# Patient Record
Sex: Male | Born: 1970
Health system: Southern US, Community
[De-identification: ages and names within clinical notes are randomized; demographics above are authoritative.]

## PROBLEM LIST (undated history)

## (undated) DIAGNOSIS — Z87442 Personal history of urinary calculi: Secondary | ICD-10-CM

## (undated) DIAGNOSIS — F411 Generalized anxiety disorder: Secondary | ICD-10-CM

## (undated) DIAGNOSIS — M1711 Unilateral primary osteoarthritis, right knee: Secondary | ICD-10-CM

## (undated) DIAGNOSIS — J309 Allergic rhinitis, unspecified: Secondary | ICD-10-CM

## (undated) DIAGNOSIS — I10 Essential (primary) hypertension: Secondary | ICD-10-CM

## (undated) DIAGNOSIS — M1990 Unspecified osteoarthritis, unspecified site: Secondary | ICD-10-CM

## (undated) DIAGNOSIS — E559 Vitamin D deficiency, unspecified: Secondary | ICD-10-CM

## (undated) DIAGNOSIS — H35713 Central serous chorioretinopathy, bilateral: Secondary | ICD-10-CM

## (undated) DIAGNOSIS — J189 Pneumonia, unspecified organism: Secondary | ICD-10-CM

## (undated) DIAGNOSIS — R519 Headache, unspecified: Secondary | ICD-10-CM

## (undated) DIAGNOSIS — E785 Hyperlipidemia, unspecified: Secondary | ICD-10-CM

## (undated) DIAGNOSIS — G4733 Obstructive sleep apnea (adult) (pediatric): Secondary | ICD-10-CM

## (undated) DIAGNOSIS — R51 Headache: Secondary | ICD-10-CM

## (undated) DIAGNOSIS — G8929 Other chronic pain: Secondary | ICD-10-CM

## (undated) DIAGNOSIS — M545 Low back pain: Secondary | ICD-10-CM

## (undated) DIAGNOSIS — E349 Endocrine disorder, unspecified: Secondary | ICD-10-CM

## (undated) HISTORY — DX: Other chronic pain: G89.29

## (undated) HISTORY — DX: Hyperlipidemia, unspecified: E78.5

## (undated) HISTORY — DX: Personal history of urinary calculi: Z87.442

## (undated) HISTORY — DX: Generalized anxiety disorder: F41.1

## (undated) HISTORY — DX: Low back pain: M54.5

---

## 2004-08-16 ENCOUNTER — Emergency Department: Payer: Self-pay | Admitting: Emergency Medicine

## 2004-10-20 ENCOUNTER — Ambulatory Visit: Payer: Self-pay | Admitting: Internal Medicine

## 2005-05-25 ENCOUNTER — Ambulatory Visit: Payer: Self-pay | Admitting: Internal Medicine

## 2005-08-30 ENCOUNTER — Ambulatory Visit: Payer: Self-pay | Admitting: Internal Medicine

## 2006-07-07 ENCOUNTER — Ambulatory Visit: Payer: Self-pay | Admitting: Internal Medicine

## 2006-10-09 ENCOUNTER — Ambulatory Visit: Payer: Self-pay | Admitting: Internal Medicine

## 2007-01-12 ENCOUNTER — Ambulatory Visit: Payer: Self-pay | Admitting: Internal Medicine

## 2007-01-13 DIAGNOSIS — F411 Generalized anxiety disorder: Secondary | ICD-10-CM

## 2007-01-13 DIAGNOSIS — E785 Hyperlipidemia, unspecified: Secondary | ICD-10-CM | POA: Insufficient documentation

## 2007-01-13 DIAGNOSIS — F418 Other specified anxiety disorders: Secondary | ICD-10-CM | POA: Insufficient documentation

## 2007-01-13 DIAGNOSIS — R51 Headache: Secondary | ICD-10-CM | POA: Insufficient documentation

## 2007-01-13 DIAGNOSIS — R519 Headache, unspecified: Secondary | ICD-10-CM | POA: Insufficient documentation

## 2007-01-13 DIAGNOSIS — J309 Allergic rhinitis, unspecified: Secondary | ICD-10-CM | POA: Insufficient documentation

## 2007-01-13 HISTORY — DX: Hyperlipidemia, unspecified: E78.5

## 2007-01-13 HISTORY — DX: Generalized anxiety disorder: F41.1

## 2007-03-03 ENCOUNTER — Encounter: Payer: Self-pay | Admitting: Internal Medicine

## 2007-04-26 DIAGNOSIS — Z87442 Personal history of urinary calculi: Secondary | ICD-10-CM | POA: Insufficient documentation

## 2007-04-26 HISTORY — DX: Personal history of urinary calculi: Z87.442

## 2007-06-06 ENCOUNTER — Telehealth (INDEPENDENT_AMBULATORY_CARE_PROVIDER_SITE_OTHER): Payer: Self-pay | Admitting: *Deleted

## 2007-06-12 ENCOUNTER — Ambulatory Visit: Payer: Self-pay | Admitting: Internal Medicine

## 2007-06-12 DIAGNOSIS — M549 Dorsalgia, unspecified: Secondary | ICD-10-CM | POA: Insufficient documentation

## 2007-07-05 ENCOUNTER — Encounter: Payer: Self-pay | Admitting: Internal Medicine

## 2007-07-12 ENCOUNTER — Ambulatory Visit: Payer: Self-pay | Admitting: Internal Medicine

## 2007-07-12 DIAGNOSIS — M545 Low back pain, unspecified: Secondary | ICD-10-CM | POA: Insufficient documentation

## 2007-08-09 ENCOUNTER — Encounter: Payer: Self-pay | Admitting: Internal Medicine

## 2007-08-24 ENCOUNTER — Telehealth: Payer: Self-pay | Admitting: Internal Medicine

## 2007-09-25 ENCOUNTER — Telehealth (INDEPENDENT_AMBULATORY_CARE_PROVIDER_SITE_OTHER): Payer: Self-pay | Admitting: *Deleted

## 2007-10-02 ENCOUNTER — Encounter: Payer: Self-pay | Admitting: Internal Medicine

## 2007-10-11 ENCOUNTER — Ambulatory Visit: Payer: Self-pay | Admitting: Internal Medicine

## 2008-01-04 ENCOUNTER — Telehealth: Payer: Self-pay | Admitting: Internal Medicine

## 2008-01-22 ENCOUNTER — Telehealth (INDEPENDENT_AMBULATORY_CARE_PROVIDER_SITE_OTHER): Payer: Self-pay | Admitting: *Deleted

## 2008-02-12 ENCOUNTER — Telehealth (INDEPENDENT_AMBULATORY_CARE_PROVIDER_SITE_OTHER): Payer: Self-pay | Admitting: *Deleted

## 2008-05-20 ENCOUNTER — Telehealth (INDEPENDENT_AMBULATORY_CARE_PROVIDER_SITE_OTHER): Payer: Self-pay | Admitting: *Deleted

## 2008-06-02 ENCOUNTER — Ambulatory Visit: Payer: Self-pay | Admitting: Internal Medicine

## 2008-06-02 DIAGNOSIS — R030 Elevated blood-pressure reading, without diagnosis of hypertension: Secondary | ICD-10-CM | POA: Insufficient documentation

## 2008-07-31 ENCOUNTER — Ambulatory Visit: Payer: Self-pay | Admitting: Internal Medicine

## 2008-07-31 LAB — CONVERTED CEMR LAB
ALT: 62 units/L — ABNORMAL HIGH (ref 0–53)
AST: 20 units/L (ref 0–37)
Alkaline Phosphatase: 69 units/L (ref 39–117)
Basophils Absolute: 0 10*3/uL (ref 0.0–0.1)
Bilirubin, Direct: 0.1 mg/dL (ref 0.0–0.3)
CO2: 28 meq/L (ref 19–32)
Chloride: 104 meq/L (ref 96–112)
Cholesterol: 193 mg/dL (ref 0–200)
Creatinine, Ser: 0.9 mg/dL (ref 0.4–1.5)
Direct LDL: 124.8 mg/dL
Eosinophils Absolute: 0.2 10*3/uL (ref 0.0–0.7)
GFR calc non Af Amer: 101 mL/min
HDL: 28.4 mg/dL — ABNORMAL LOW (ref >39.0)
Hemoglobin, Urine: NEGATIVE
Ketones, ur: NEGATIVE mg/dL
Leukocytes, UA: NEGATIVE
Lymphocytes Relative: 37.4 % (ref 12.0–46.0)
MCHC: 34.9 g/dL (ref 30.0–36.0)
MCV: 89.1 fL (ref 78.0–100.0)
Neutrophils Relative %: 51 % (ref 43.0–77.0)
Platelets: 305 10*3/uL (ref 150–400)
Potassium: 4.2 meq/L (ref 3.5–5.1)
RBC: 4.65 M/uL (ref 4.22–5.81)
Sodium: 139 meq/L (ref 135–145)
Specific Gravity, Urine: 1.025 (ref 1.000–1.03)
Total Bilirubin: 0.9 mg/dL (ref 0.3–1.2)
Total CHOL/HDL Ratio: 6.8
Urine Glucose: NEGATIVE mg/dL
Urobilinogen, UA: 0.2 (ref 0.0–1.0)
VLDL: 52 mg/dL — ABNORMAL HIGH (ref 0–40)

## 2008-08-05 ENCOUNTER — Ambulatory Visit: Payer: Self-pay | Admitting: Internal Medicine

## 2008-09-12 ENCOUNTER — Telehealth (INDEPENDENT_AMBULATORY_CARE_PROVIDER_SITE_OTHER): Payer: Self-pay | Admitting: *Deleted

## 2008-09-17 ENCOUNTER — Telehealth: Payer: Self-pay | Admitting: Internal Medicine

## 2008-09-29 ENCOUNTER — Telehealth (INDEPENDENT_AMBULATORY_CARE_PROVIDER_SITE_OTHER): Payer: Self-pay | Admitting: *Deleted

## 2008-12-30 ENCOUNTER — Telehealth: Payer: Self-pay | Admitting: Internal Medicine

## 2009-01-12 ENCOUNTER — Telehealth (INDEPENDENT_AMBULATORY_CARE_PROVIDER_SITE_OTHER): Payer: Self-pay | Admitting: *Deleted

## 2009-01-27 ENCOUNTER — Telehealth: Payer: Self-pay | Admitting: Internal Medicine

## 2009-02-04 ENCOUNTER — Telehealth: Payer: Self-pay | Admitting: Internal Medicine

## 2009-02-10 ENCOUNTER — Ambulatory Visit: Payer: Self-pay | Admitting: Internal Medicine

## 2009-02-26 ENCOUNTER — Telehealth: Payer: Self-pay | Admitting: Internal Medicine

## 2009-05-22 ENCOUNTER — Telehealth: Payer: Self-pay | Admitting: Internal Medicine

## 2009-06-08 ENCOUNTER — Telehealth: Payer: Self-pay | Admitting: Internal Medicine

## 2009-08-20 ENCOUNTER — Telehealth: Payer: Self-pay | Admitting: Internal Medicine

## 2009-09-24 ENCOUNTER — Telehealth: Payer: Self-pay | Admitting: Internal Medicine

## 2009-11-25 ENCOUNTER — Telehealth: Payer: Self-pay | Admitting: Internal Medicine

## 2009-12-24 ENCOUNTER — Telehealth: Payer: Self-pay | Admitting: Internal Medicine

## 2010-01-25 ENCOUNTER — Telehealth: Payer: Self-pay | Admitting: Internal Medicine

## 2010-02-26 ENCOUNTER — Ambulatory Visit: Payer: Self-pay | Admitting: Internal Medicine

## 2010-02-26 LAB — CONVERTED CEMR LAB
ALT: 44 units/L (ref 0–53)
BUN: 18 mg/dL (ref 6–23)
Basophils Relative: 0.4 % (ref 0.0–3.0)
CO2: 27 meq/L (ref 19–32)
Chloride: 107 meq/L (ref 96–112)
Cholesterol: 263 mg/dL — ABNORMAL HIGH (ref 0–200)
Creatinine, Ser: 0.8 mg/dL (ref 0.4–1.5)
Eosinophils Absolute: 0.3 10*3/uL (ref 0.0–0.7)
Eosinophils Relative: 3.9 % (ref 0.0–5.0)
HCT: 41.7 % (ref 39.0–52.0)
Ketones, ur: NEGATIVE mg/dL
Lymphs Abs: 2.1 10*3/uL (ref 0.7–4.0)
MCHC: 35.5 g/dL (ref 30.0–36.0)
MCV: 89.2 fL (ref 78.0–100.0)
Monocytes Absolute: 0.5 10*3/uL (ref 0.1–1.0)
Platelets: 234 10*3/uL (ref 150.0–400.0)
Potassium: 5 meq/L (ref 3.5–5.1)
Specific Gravity, Urine: 1.02 (ref 1.000–1.030)
TSH: 0.97 microintl units/mL (ref 0.35–5.50)
Total Bilirubin: 0.7 mg/dL (ref 0.3–1.2)
Total Protein: 7.4 g/dL (ref 6.0–8.3)
Triglycerides: 345 mg/dL — ABNORMAL HIGH (ref 0.0–149.0)
Urine Glucose: NEGATIVE mg/dL
WBC: 6.8 10*3/uL (ref 4.5–10.5)
pH: 6 (ref 5.0–8.0)

## 2010-03-04 ENCOUNTER — Telehealth: Payer: Self-pay | Admitting: Internal Medicine

## 2010-05-31 ENCOUNTER — Telehealth: Payer: Self-pay | Admitting: Internal Medicine

## 2010-07-22 NOTE — Progress Notes (Signed)
Summary: med. refill  Phone Note Refill Request Message from:  Fax from Pharmacy on May 31, 2010 11:36 AM  Refills Requested: Medication #1:  VICODIN ES 7.5-750 MG  TABS 1 by mouth qid as needed pain - to fill june 8   Dosage confirmed as above?Dosage Confirmed   Last Refilled: 02/2010   Notes: [facility] [place] [place] [phone] Initial call taken by: Zella Ball Ewing CMA Duncan Dull),  May 31, 2010 11:38 AM    New/Updated Medications: VICODIN ES 7.5-750 MG  TABS (HYDROCODONE-ACETAMINOPHEN) 1 by mouth qid as needed pain - to fill May 31, 2010 Prescriptions: VICODIN ES 7.5-750 MG  TABS (HYDROCODONE-ACETAMINOPHEN) 1 by mouth qid as needed pain - to fill May 31, 2010  #120 x 2   Entered and Authorized by:   Corwin Levins MD   Signed by:   Corwin Levins MD on 05/31/2010   Method used:   Print then Give to Patient   RxID:   [id number]  done hardcopy to LIM side B - dahlia Corwin Levins MD  May 31, 2010 1:00 PM   Rx faxed to pharmacy Margaret Pyle, CMA  May 31, 2010 1:06 PM

## 2010-07-22 NOTE — Progress Notes (Signed)
Summary: medication refill  Phone Note Refill Request Message from:  Fax from Pharmacy on January 25, 2010 9:27 AM  Refills Requested: Medication #1:  XANAX 0.5 MG  TABS 1 by mouth three times a day as needed  - to fill july 7   Dosage confirmed as above?Dosage Confirmed   Last Refilled: 12/24/2009   [facility] [address] [id number] Initial call taken by: Zella Ball Ewing CMA Duncan Dull),  January 25, 2010 9:27 AM  Follow-up for Phone Call        called pt to inform to schedule office visit. He has scheduled one for February 26, 2010 at 8:30. Follow-up by: Zella Ball Ewing CMA Duncan Dull),  January 25, 2010 1:44 PM  Additional Follow-up for Phone Call Additional follow up Details #1::        done hardcopy to LIM side B - dahlia  Additional Follow-up by: Corwin Levins MD,  January 25, 2010 2:44 PM    Additional Follow-up for Phone Call Additional follow up Details #2::    Rx faxed to pharmacy Follow-up by: Margaret Pyle, CMA,  January 25, 2010 2:46 PM  New/Updated Medications: XANAX 0.5 MG  TABS (ALPRAZOLAM) 1 by mouth three times a day as needed  - to fill Jan 25, 2010 Prescriptions: XANAX 0.5 MG  TABS (ALPRAZOLAM) 1 by mouth three times a day as needed  - to fill Jan 25, 2010  #90 x 0   Entered and Authorized by:   Corwin Levins MD   Signed by:   Corwin Levins MD on 01/25/2010   Method used:   Print then Give to Patient   RxID:   [id number]   no/needs OV - pt was notified at last refills request  Corwin Levins MD  January 25, 2010 12:51 PM

## 2010-07-22 NOTE — Assessment & Plan Note (Signed)
Summary: CPX/ WANTS TO DO LABS SAME DAY PRIOR TO APPT/NWS  #   Vital Signs:  Patient profile:   41 year old male Height:      69 inches Weight:      222.25 pounds BMI:     32.94 O2 Sat:      97 % on Room air Temp:     98.5 degrees F oral Pulse rate:   68 / minute BP sitting:   114 / 72  (left arm) Cuff size:   large  Vitals Entered By: Zella Ball Ewing CMA Duncan Dull) (February 26, 2010 8:18 AM)  O2 Flow:  Room air  Preventive Care Screening     declnies flu shot today  CC: Adult Physical/RE   CC:  Adult Physical/RE.  History of Present Illness: overall doing ok;  pk wt over the past yr 11, but down again to 222 today (wwe last yr);  Pt denies CP, worsening sob, doe, wheezing, orthopnea, pnd, worsening LE edema, palps, dizziness or syncope  Pt denies new neuro symptoms such as headache, facial or extremity weakness  No fever, wt loss, night sweats, loss of appetite or other constitutional symptoms  Walks 2 miles per day 5 days per wk at the gym on treadmill. Works for [employer] for [employer] of [employer].    Preventive Screening-Counseling & Management      Drug Use:  no.    Problems Prior to Update: 1)  General Cnsl Initiation Oth Contracept Measures  (ICD-V25.02) 2)  Encounter For Fertility Preservation Counseling  (ICD-V26.42) 3)  Preventive Health Care  (ICD-V70.0) 4)  Elevated Blood Pressure Without Diagnosis of Hypertension  (ICD-796.2) 5)  Low Back Pain  (ICD-724.2) 6)  Back Pain  (ICD-724.5) 7)  Nephrolithiasis, Hx of  (ICD-V13.01) 8)  Hyperlipidemia  (ICD-272.4) 9)  Headache  (ICD-784.0) 10)  Anxiety  (ICD-300.00) 11)  Allergic Rhinitis  (ICD-477.9)  Medications Prior to Update: 1)  Zyrtec Allergy 10 Mg  Tabs (Cetirizine Hcl) .... Take One Tablet Daily 2)  Xanax 0.5 Mg  Tabs (Alprazolam) .Marland Kitchen.. 1 By Mouth Three Times A Day As Needed  - To Fill Jan 25, 2010 3)  Vicodin Es 7.5-750 Mg  Tabs (Hydrocodone-Acetaminophen) .Marland Kitchen.. 1 By Mouth Qid As Needed Pain - To Fill November 25, 2009  -   Note:  Pt Needs Return Office Visit For Further Refills Please 4)  Fluticasone Propionate 50 Mcg/act  Susp (Fluticasone Propionate) .... 2 Sprays Each Nostril 1 Qd 5)  Naproxen 500 Mg Tabs (Naproxen) .Marland Kitchen.. 1 By Mouth Two Times A Day As Needed Pain  Current Medications (verified): 1)  Zyrtec Allergy 10 Mg  Tabs (Cetirizine Hcl) .... Take One Tablet Daily 2)  Xanax 0.5 Mg  Tabs (Alprazolam) .Marland Kitchen.. 1 By Mouth Three Times A Day As Needed  - To Fill Sept 9. 2011 3)  Vicodin Es 7.5-750 Mg  Tabs (Hydrocodone-Acetaminophen) .Marland Kitchen.. 1 By Mouth Qid As Needed Pain - To Fill November 25, 2009 4)  Fluticasone Propionate 50 Mcg/act  Susp (Fluticasone Propionate) .... 2 Sprays Each Nostril Once Daily 5)  Naproxen 500 Mg Tabs (Naproxen) .Marland Kitchen.. 1 By Mouth Two Times A Day As Needed Pain  Allergies (verified): No Known Drug Allergies  Past History:  Family History: Last updated: 02/26/2010 CVA alzheimer's Family History Hypertension DM - mother and uncle father with prostate cancer  Social History: Last updated: 02/26/2010 Former Smoker Alcohol use-yes - rare Married 3 children work - new job sept 2010 [employer] Drug use-no  Risk Factors: Smoking Status: quit (04/26/2007)  Past Medical History: HYPERLIPIDEMIA (ICD-272.4) HEADACHE (ICD-784.0) ANXIETY (ICD-300.00) ALLERGIC RHINITIS (ICD-477.9) Nephrolithiasis, hx of Low back pain  Past Surgical History: Reviewed history from 01/13/2007 and no changes required. Denies surgical history  Family History: Reviewed history from 04/26/2007 and no changes required. CVA alzheimer's Family History Hypertension DM - mother and uncle father with prostate cancer  Social History: Reviewed history from 02/10/2009 and no changes required. Former Smoker Alcohol use-yes - rare Married 3 children work - new job sept 2010 - [employer] Drug use-no Drug Use:  no  Review of  Systems  The patient denies anorexia, fever, weight loss, weight gain, vision loss, decreased hearing, hoarseness, chest pain, syncope, dyspnea on exertion, peripheral edema, prolonged cough, headaches, hemoptysis, abdominal pain, melena, hematochezia, severe indigestion/heartburn, hematuria, muscle weakness, suspicious skin lesions, transient blindness, difficulty walking, depression, unusual weight change, abnormal bleeding, enlarged lymph nodes, and angioedema.         all otherwise negative per pt -    Physical Exam  General:  alert and overweight-appearing.   Head:  normocephalic and atraumatic.   Eyes:  vision grossly intact, pupils equal, and pupils round.   Ears:  R ear normal and L ear normal.   Nose:  no external deformity and no nasal discharge.   Mouth:  no gingival abnormalities and pharynx pink and moist.   Neck:  supple and no masses.   Lungs:  normal respiratory effort and normal breath sounds.   Heart:  normal rate and regular rhythm.   Abdomen:  soft, non-tender, and normal bowel sounds.   Msk:  no joint tenderness and no joint swelling.   Extremities:  no edema, no erythema  Neurologic:  cranial nerves II-XII intact and strength normal in all extremities.   Skin:  color normal and no rashes.   Psych:  not anxious appearing and not depressed appearing.     Impression & Recommendations:  Problem # 1:  Preventive Health Care (ICD-V70.0)  Overall doing well, age appropriate education and counseling updated and referral for appropriate preventive services done unless declined, immunizations up to date or declined, diet counseling done if overweight, urged to quit smoking if smokes , most recent labs reviewed and current ordered if appropriate, ecg reviewed or declined (interpretation per ECG scanned in the EMR if done); information regarding Medicare Prevention requirements given if appropriate; speciality referrals updated as appropriate   Orders: TLB-BMP (Basic  Metabolic Panel-BMET) (80048-METABOL) TLB-CBC Platelet - w/Differential (85025-CBCD) TLB-Hepatic/Liver Function Pnl (80076-HEPATIC) TLB-TSH (Thyroid Stimulating Hormone) (84443-TSH) TLB-Lipid Panel (80061-LIPID) TLB-Udip ONLY (81003-UDIP)  Problem # 2:     Complete Medication List: 1)  Zyrtec Allergy 10 Mg Tabs (Cetirizine hcl) .... Take one tablet daily 2)  Xanax 0.5 Mg Tabs (Alprazolam) .Marland Kitchen.. 1 by mouth three times a day as needed  - to fill sept 9. 2011 3)  Vicodin Es 7.5-750 Mg Tabs (Hydrocodone-acetaminophen) .Marland Kitchen.. 1 by mouth qid as needed pain - to fill November 25, 2009 4)  Fluticasone Propionate 50 Mcg/act Susp (Fluticasone propionate) .... 2 sprays each nostril once daily 5)  Naproxen 500 Mg Tabs (Naproxen) .Marland Kitchen.. 1 by mouth two times a day as needed pain  Patient Instructions: 1)  Continue all previous medications as before this visit  2)  Please go to the Lab in the basement for your blood and/or urine tests today 3)  Please call the number on the  Blue Card for results of your testing 4)  Please schedule a follow-up appointment in 1 year or sooner if needed Prescriptions: XANAX 0.5 MG  TABS (ALPRAZOLAM) 1 by mouth three times a day as needed  - to fill sept 9. 2011  #90 x 2   Entered and Authorized by:   Corwin Levins MD   Signed by:   Corwin Levins MD on 02/26/2010   Method used:   Print then Give to Patient   RxID:   [id number] VICODIN ES 7.5-750 MG  TABS (HYDROCODONE-ACETAMINOPHEN) 1 by mouth qid as needed pain - to fill November 25, 2009  #120 x 2   Entered and Authorized by:   Corwin Levins MD   Signed by:   Corwin Levins MD on 02/26/2010   Method used:   Print then Give to Patient   RxID:   [id number] FLUTICASONE PROPIONATE 50 MCG/ACT  SUSP (FLUTICASONE PROPIONATE) 2 sprays each nostril once daily  #3 x 3   Entered and Authorized by:   Corwin Levins MD   Signed by:   Corwin Levins MD on 02/26/2010   Method used:   Print then Give to Patient   RxID:   [id number]

## 2010-07-22 NOTE — Progress Notes (Signed)
Summary: hepatitis panel  Phone Note Call from Patient   Caller: Patient Reason for Call: Talk to Nurse Summary of Call: Left msg on vm wanting to get hepatitis level check. Called pt back to get more info did not pick up left msg on vm to RTC Initial call taken by: Orlan Leavens RMA,  March 04, 2010 4:44 PM  Follow-up for Phone Call        called pt again he states he had Hepatitis series done at his work. Last shot was done back in december. Pt wanting to see if he can have bloodwork to see if shot actually took. Pls advise Follow-up by: Orlan Leavens RMA,  March 05, 2010 12:10 PM  Additional Follow-up for Phone Call Additional follow up Details #1::        this is normally not necesarry if he had the series of shots, even if not done exactly as recommended on the shot timing;  if he really wants, we can do the Hep B s Ab (hep B surface Antibody) to see if the shot was effective - v58.69 Additional Follow-up by: Corwin Levins MD,  March 05, 2010 1:01 PM    Additional Follow-up for Phone Call Additional follow up Details #2::    Called pt no ansew LMOM RTC Follow-up by: Orlan Leavens RMA,  March 05, 2010 2:36 PM  Additional Follow-up for Phone Call Additional follow up Details #3:: Details for Additional Follow-up Action Taken: Pt return call back gave him md response. Pt states if he decides to have check he will get it threw his job. Additional Follow-up by: Orlan Leavens RMA,  March 05, 2010 3:54 PM

## 2010-07-22 NOTE — Progress Notes (Signed)
Summary: Med refill  Phone Note Refill Request  on August 20, 2009 9:24 AM  Refills Requested: Medication #1:  VICODIN ES 7.5-750 MG  TABS 1 by mouth qid as needed pain - to fill dec 3   Dosage confirmed as above?Dosage Confirmed   [facility] [address] [place] [phone] Initial call taken by: Scharlene Gloss,  August 20, 2009 9:25 AM  Follow-up for Phone Call        rx faxed to pharmacy Follow-up by: Margaret Pyle, CMA,  August 20, 2009 1:17 PM    New/Updated Medications: VICODIN ES 7.5-750 MG  TABS (HYDROCODONE-ACETAMINOPHEN) 1 by mouth qid as needed pain - to fill Aug 20, 2009 Prescriptions: VICODIN ES 7.5-750 MG  TABS (HYDROCODONE-ACETAMINOPHEN) 1 by mouth qid as needed pain - to fill Aug 20, 2009  #120 x 2   Entered and Authorized by:   Corwin Levins MD   Signed by:   Corwin Levins MD on 08/20/2009   Method used:   Print then Give to Patient   RxID:   [id number]  done hardcopy to LIM side B - dahlia  Corwin Levins MD  August 20, 2009 1:08 PM

## 2010-07-22 NOTE — Progress Notes (Signed)
Summary: Medication Refill  Phone Note Refill Request  on November 25, 2009 8:46 AM  Refills Requested: Medication #1:  VICODIN ES 7.5-750 MG  TABS 1 by mouth qid as needed pain - to fill mar 3   Dosage confirmed as above?Dosage Confirmed   [facility] [address] [place] [phone] Initial call taken by: Scharlene Gloss,  November 25, 2009 8:47 AM    New/Updated Medications: VICODIN ES 7.5-750 MG  TABS (HYDROCODONE-ACETAMINOPHEN) 1 by mouth qid as needed pain - to fill November 25, 2009  -   NOTE:  pt needs return office visit for further refills please Prescriptions: VICODIN ES 7.5-750 MG  TABS (HYDROCODONE-ACETAMINOPHEN) 1 by mouth qid as needed pain - to fill November 25, 2009  -   NOTE:  pt needs return office visit for further refills please  #120 x 2   Entered and Authorized by:   Corwin Levins MD   Signed by:   Corwin Levins MD on 11/25/2009   Method used:   Print then Give to Patient   RxID:   [id number]  .don e Corwin Levins MD  November 25, 2009 10:07 AM   Rx faxed to pharmacy Margaret Pyle, CMA  November 25, 2009 10:09 AM

## 2010-07-22 NOTE — Progress Notes (Signed)
Summary: Med Refill  Phone Note Refill Request  on September 24, 2009 11:11 AM  Refills Requested: Medication #1:  XANAX 0.5 MG  TABS 1 by mouth three times a day as needed  - to fill dec 20   Dosage confirmed as above?Dosage Confirmed   [facility] [place] [phone] Initial call taken by: Scharlene Gloss,  September 24, 2009 11:15 AM  Follow-up for Phone Call        Rx faxed to pharmacy Follow-up by: Margaret Pyle, CMA,  September 24, 2009 1:17 PM    New/Updated Medications: XANAX 0.5 MG  TABS (ALPRAZOLAM) 1 by mouth three times a day as needed  - to fill Sep 24, 2009 Prescriptions: XANAX 0.5 MG  TABS (ALPRAZOLAM) 1 by mouth three times a day as needed  - to fill Sep 24, 2009  #90 x 2   Entered and Authorized by:   Corwin Levins MD   Signed by:   Corwin Levins MD on 09/24/2009   Method used:   Print then Give to Patient   RxID:   [id number]  done hardcopy to LIM side B - dahlia  Corwin Levins MD  September 24, 2009 1:12 PM

## 2010-07-22 NOTE — Progress Notes (Signed)
Summary: med refill  Phone Note Refill Request Message from:  Fax from Pharmacy on May 31, 2010 1:41 PM  Refills Requested: Medication #1:  XANAX 0.5 MG  TABS 1 by mouth three times a day as needed  - to fill sept 9. 2011   Dosage confirmed as above?Dosage Confirmed   Last Refilled: 02/26/2010   Notes: [facility] [address] [place], fax#[phone] Initial call taken by: Zella Ball Ewing CMA Duncan Dull),  May 31, 2010 1:41 PM    New/Updated Medications: XANAX 0.5 MG  TABS (ALPRAZOLAM) 1 by mouth three times a day as needed  - to fill dec 9 , 2011 Prescriptions: XANAX 0.5 MG  TABS (ALPRAZOLAM) 1 by mouth three times a day as needed  - to fill dec 9 , 2011  #90 x 2   Entered and Authorized by:   Corwin Levins MD   Signed by:   Corwin Levins MD on 05/31/2010   Method used:   Print then Give to Patient   RxID:   [id number]  done hardcopy to LIM side B - dahlia Corwin Levins MD  May 31, 2010 5:48 PM   Rx faxed to pharmacy Margaret Pyle, CMA  June 01, 2010 8:04 AM

## 2010-07-22 NOTE — Progress Notes (Signed)
Summary: Medication Refill  Phone Note Refill Request Message from:  Fax from Pharmacy on December 24, 2009 9:05 AM  Refills Requested: Medication #1:  XANAX 0.5 MG  TABS 1 by mouth three times a day as needed  - to fill apr 7   Dosage confirmed as above?Dosage Confirmed   Last Refilled: 09/24/2009   Notes[facility] [place] [phone] Initial call taken by: Zella Ball Ewing CMA Duncan Dull),  December 24, 2009 9:06 AM    New/Updated Medications: XANAX 0.5 MG  TABS (ALPRAZOLAM) 1 by mouth three times a day as needed  - to fill December 24, 2009  - please make [place] office visit for further refills Prescriptions: XANAX 0.5 MG  TABS (ALPRAZOLAM) 1 by mouth three times a day as needed  - to fill December 24, 2009  - please make [place] office visit for further refills  #90 x 0   Entered and Authorized by:   Corwin Levins MD   Signed by:   Corwin Levins MD on 12/24/2009   Method used:   Print then Give to Patient   RxID:   [id number]  done hardcopy to LIM side B - dahlia  Corwin Levins MD  December 24, 2009 9:07 AM   Rx faxed to pharmacy Margaret Pyle, CMA  December 24, 2009 9:09 AM

## 2010-08-31 ENCOUNTER — Telehealth: Payer: Self-pay | Admitting: Internal Medicine

## 2010-09-07 NOTE — Progress Notes (Signed)
Summary: med refill  Phone Note Refill Request Message from:  Fax from Pharmacy on August 31, 2010 2:51 PM  Refills Requested: Medication #1:  XANAX 0.5 MG  TABS 1 by mouth three times a day as needed  - to fill dec 9   Dosage confirmed as above?Dosage Confirmed   Last Refilled: 05/31/2010   Notes[facility] [address] [phone]  Medication #2:  VICODIN ES 7.5-750 MG  TABS 1 by mouth qid as needed pain - to fill dec 12   Dosage confirmed as above?Dosage Confirmed   Last Refilled: 05/31/2010   Notes: [facility] [address] [phone] Initial call taken by: Zella Ball Ewing CMA Duncan Dull),  August 31, 2010 2:53 PM  Follow-up for Phone Call        done hardcopy to LIM side B - dahlia  Follow-up by: Corwin Levins MD,  August 31, 2010 4:43 PM  Additional Follow-up for Phone Call Additional follow up Details #1::        Rxs faxed to pharmacy Additional Follow-up by: Margaret Pyle, CMA,  August 31, 2010 4:48 PM    New/Updated Medications: XANAX 0.5 MG  TABS (ALPRAZOLAM) 1 by mouth three times a day as needed  - to fill Aug 31, 2010 VICODIN ES 7.5-750 MG  TABS (HYDROCODONE-ACETAMINOPHEN) 1 by mouth qid as needed pain - to fill Aug 31, 2010 Prescriptions: XANAX 0.5 MG  TABS (ALPRAZOLAM) 1 by mouth three times a day as needed  - to fill Aug 31, 2010  #90 x 2   Entered and Authorized by:   Corwin Levins MD   Signed by:   Corwin Levins MD on 08/31/2010   Method used:   Print then Give to Patient   RxID:   [id number] VICODIN ES 7.5-750 MG  TABS (HYDROCODONE-ACETAMINOPHEN) 1 by mouth qid as needed pain - to fill Aug 31, 2010  #120 x 2   Entered and Authorized by:   Corwin Levins MD   Signed by:   Corwin Levins MD on 08/31/2010   Method used:   Print then Give to Patient   RxID:   [id number]

## 2010-12-01 ENCOUNTER — Other Ambulatory Visit: Payer: Self-pay

## 2010-12-01 MED ORDER — HYDROCODONE-ACETAMINOPHEN 7.5-750 MG PO TABS: 1.0000 | ORAL_TABLET | Freq: Four times a day (QID) | ORAL | Status: DC | PRN

## 2010-12-01 NOTE — Telephone Encounter (Signed)
Faxed hardcopy to pharmacy. 

## 2010-12-02 ENCOUNTER — Other Ambulatory Visit: Payer: Self-pay

## 2010-12-02 MED ORDER — ALPRAZOLAM 0.5 MG PO TABS: 0.5000 mg | ORAL_TABLET | Freq: Three times a day (TID) | ORAL | Status: DC | PRN

## 2010-12-02 NOTE — Telephone Encounter (Signed)
Sent hardcopy to pharmacy 

## 2011-02-10 ENCOUNTER — Other Ambulatory Visit: Payer: Self-pay

## 2011-02-10 MED ORDER — HYDROCODONE-ACETAMINOPHEN 7.5-750 MG PO TABS: 1.0000 | ORAL_TABLET | Freq: Four times a day (QID) | ORAL | Status: DC | PRN

## 2011-02-10 MED ORDER — ALPRAZOLAM 0.5 MG PO TABS: 0.5000 mg | ORAL_TABLET | Freq: Three times a day (TID) | ORAL | Status: DC | PRN

## 2011-02-10 NOTE — Telephone Encounter (Signed)
Faxed hardcopy to pharmacy. 

## 2011-03-17 ENCOUNTER — Other Ambulatory Visit: Payer: Self-pay

## 2011-03-17 MED ORDER — HYDROCODONE-ACETAMINOPHEN 7.5-750 MG PO TABS: 1.0000 | ORAL_TABLET | Freq: Four times a day (QID) | ORAL | Status: DC | PRN

## 2011-03-17 MED ORDER — ALPRAZOLAM 0.5 MG PO TABS: 0.5000 mg | ORAL_TABLET | Freq: Three times a day (TID) | ORAL | Status: DC | PRN

## 2011-03-17 NOTE — Telephone Encounter (Signed)
Done hardcopy to robin  

## 2011-03-18 NOTE — Telephone Encounter (Signed)
[facility]ed [facility] [address] [address] [place] [phone]

## 2011-04-14 ENCOUNTER — Other Ambulatory Visit: Payer: Self-pay

## 2011-04-14 NOTE — Telephone Encounter (Signed)
Tried to call the patient to inform of MD's instructions, but the number in the chart has been disconnected. Informed pharmacy.

## 2011-04-14 NOTE — Telephone Encounter (Signed)
No refills done today  Pt due for OV as documented with last refills

## 2011-04-18 ENCOUNTER — Encounter: Payer: Self-pay | Admitting: Internal Medicine

## 2011-04-18 ENCOUNTER — Telehealth: Payer: Self-pay | Admitting: *Deleted

## 2011-04-18 DIAGNOSIS — Z0000 Encounter for general adult medical examination without abnormal findings: Secondary | ICD-10-CM | POA: Insufficient documentation

## 2011-04-18 NOTE — Telephone Encounter (Signed)
Called the patient back informed of MD's instruction from 04/14/2011 patient agreed to schedule office visit. New phone number is [phone]

## 2011-04-18 NOTE — Telephone Encounter (Signed)
Patient requesting RFs. He did not leave meds that he needed. Please call patient to clarify.

## 2011-04-19 ENCOUNTER — Encounter: Payer: Self-pay | Admitting: Internal Medicine

## 2011-04-19 ENCOUNTER — Other Ambulatory Visit (INDEPENDENT_AMBULATORY_CARE_PROVIDER_SITE_OTHER): Payer: BC Managed Care – PPO

## 2011-04-19 ENCOUNTER — Other Ambulatory Visit: Payer: Self-pay | Admitting: Internal Medicine

## 2011-04-19 ENCOUNTER — Ambulatory Visit (INDEPENDENT_AMBULATORY_CARE_PROVIDER_SITE_OTHER): Payer: BC Managed Care – PPO | Admitting: Internal Medicine

## 2011-04-19 VITALS — BP 110/60 | HR 66 | Temp 97.3°F | Ht 69.0 in | Wt 225.2 lb

## 2011-04-19 DIAGNOSIS — F411 Generalized anxiety disorder: Secondary | ICD-10-CM

## 2011-04-19 DIAGNOSIS — M545 Low back pain, unspecified: Secondary | ICD-10-CM | POA: Insufficient documentation

## 2011-04-19 DIAGNOSIS — G8929 Other chronic pain: Secondary | ICD-10-CM

## 2011-04-19 DIAGNOSIS — J309 Allergic rhinitis, unspecified: Secondary | ICD-10-CM

## 2011-04-19 DIAGNOSIS — Z0000 Encounter for general adult medical examination without abnormal findings: Secondary | ICD-10-CM

## 2011-04-19 HISTORY — DX: Other chronic pain: G89.29

## 2011-04-19 HISTORY — DX: Low back pain, unspecified: M54.50

## 2011-04-19 LAB — URINALYSIS, ROUTINE W REFLEX MICROSCOPIC
Bilirubin Urine: NEGATIVE
Ketones, ur: NEGATIVE
Leukocytes, UA: NEGATIVE
pH: 6 (ref 5.0–8.0)

## 2011-04-19 LAB — HEPATIC FUNCTION PANEL
AST: 16 U/L (ref 0–37)
Alkaline Phosphatase: 66 U/L (ref 39–117)
Bilirubin, Direct: 0.1 mg/dL (ref 0.0–0.3)
Total Bilirubin: 0.4 mg/dL (ref 0.3–1.2)

## 2011-04-19 LAB — LIPID PANEL
HDL: 39.4 mg/dL (ref >39.00)
VLDL: 50.2 mg/dL — ABNORMAL HIGH (ref 0.0–40.0)

## 2011-04-19 LAB — CBC WITH DIFFERENTIAL/PLATELET
Basophils Absolute: 0 10*3/uL (ref 0.0–0.1)
Eosinophils Relative: 3.5 % (ref 0.0–5.0)
HCT: 42.7 % (ref 39.0–52.0)
Lymphs Abs: 1.9 10*3/uL (ref 0.7–4.0)
MCHC: 34.9 g/dL (ref 30.0–36.0)
MCV: 89 fl (ref 78.0–100.0)
Monocytes Absolute: 0.5 10*3/uL (ref 0.1–1.0)
Platelets: 246 10*3/uL (ref 150.0–400.0)
RDW: 14.2 % (ref 11.5–14.6)

## 2011-04-19 LAB — TSH: TSH: 1.85 u[IU]/mL (ref 0.35–5.50)

## 2011-04-19 LAB — BASIC METABOLIC PANEL
GFR: 115.84 mL/min (ref >60.00)
Potassium: 3.9 mEq/L (ref 3.5–5.1)
Sodium: 142 mEq/L (ref 135–145)

## 2011-04-19 MED ORDER — HYDROCODONE-ACETAMINOPHEN 7.5-325 MG PO TABS: 1.0000 | ORAL_TABLET | Freq: Four times a day (QID) | ORAL | Status: AC | PRN

## 2011-04-19 MED ORDER — ESCITALOPRAM OXALATE 10 MG PO TABS: 10.0000 mg | ORAL_TABLET | Freq: Every day | ORAL | Status: AC

## 2011-04-19 MED ORDER — ALPRAZOLAM 0.5 MG PO TABS: 0.5000 mg | ORAL_TABLET | Freq: Three times a day (TID) | ORAL | Status: DC | PRN

## 2011-04-19 MED ORDER — CETIRIZINE HCL 10 MG PO TABS: 10.0000 mg | ORAL_TABLET | Freq: Every day | ORAL | Status: DC

## 2011-04-19 MED ORDER — FLUTICASONE PROPIONATE 50 MCG/ACT NA SUSP: 2.0000 | Freq: Every day | NASAL | Status: DC

## 2011-04-19 NOTE — Patient Instructions (Signed)
Start the lexapro 10 mg per day The vicodin was adjusted to a lower tylenol dose Continue all other medications as before, but try to take less of the xanax if you can You will be contacted regarding the referral for: MRI for the lower back Please go to LAB in the Basement for the blood and/or urine tests to be done today Please call the phone number [phone] (the [employer]Tree System) for results of testing in 2-3 days;  When calling, simply dial the number, and when prompted enter the MRN number above (the Medical Record Number) and the # key, then the message should start. You are given the work note Please return in 3 months

## 2011-04-19 NOTE — Assessment & Plan Note (Signed)

## 2011-04-19 NOTE — Assessment & Plan Note (Signed)
stable overall by hx and exam, , and pt to continue medical treatment as before  For flonase refill

## 2011-04-19 NOTE — Progress Notes (Signed)
Subjective:    Patient ID: Gabriel Moon, male    DOB: [birth year], 40 y.o.   MRN: [id number]  HPI  Here for wellness and f/u;  Overall doing ok;  Pt denies CP, worsening SOB, DOE, wheezing, orthopnea, PND, worsening LE edema, palpitations, dizziness or syncope.  Pt denies neurological change such as new Headache, facial or extremity weakness.  Pt denies polydipsia, polyuria, or low sugar symptoms. Pt states overall good compliance with treatment and medications, good tolerability, and trying to follow lower cholesterol diet.  Pt denies worsening depressive symptoms, suicidal ideation or panic. No fever, wt loss, night sweats, loss of appetite, or other constitutional symptoms.  Pt states good ability with ADL's, low fall risk, home safety reviewed and adequate, no significant changes in hearing or vision, and occasionally active with exercise.  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  Fairly physical work, lifts up to about 50 lbs alone up to once per wk, most often 10 to 20 lbs rest of the wk, other times can bend, work overhead, up ladders, but no squatting or crawlling (except rare).  Does have sense of ongoing fatigue, but denies signficant hypersomnolence.  Denies worsening depressive symptoms, suicidal ideation, or panic, though has ongoing anxiety, still fairly anxiety due to social stressors. Past Medical History  Diagnosis Date  . Chronic LBP 04/19/2011  . HYPERLIPIDEMIA 01/13/2007  . ANXIETY 01/13/2007  . ALLERGIC RHINITIS 01/13/2007  . NEPHROLITHIASIS, HX OF 04/26/2007   No past surgical history on file.  reports that he has quit smoking. He does not have any smokeless tobacco history on file. He  reports that he drinks alcohol. He reports that he does not use illicit drugs. family history is not on file. No Known Allergies No current outpatient prescriptions on file prior to visit.   Review of Systems Review of Systems  Constitutional: Negative for diaphoresis, activity change, appetite change and unexpected weight change.  HENT: Negative for hearing loss, ear pain, facial swelling, mouth sores and neck stiffness.   Eyes: Negative for pain, redness and visual disturbance.  Respiratory: Negative for shortness of breath and wheezing.   Cardiovascular: Negative for chest pain and palpitations.  Gastrointestinal: Negative for diarrhea, blood in stool, abdominal distention and rectal pain.  Genitourinary: Negative for hematuria, flank pain and decreased urine volume.  Musculoskeletal: Negative for myalgias and joint swelling.  Skin: Negative for color change and wound.  Neurological: Negative for syncope and numbness.  Hematological: Negative for adenopathy.  Psychiatric/Behavioral: Negative for hallucinations, self-injury, decreased concentration and agitation.      Objective:   Physical Exam BP 110/60  Pulse 66  Temp(Src) 97.3 F (36.3 C) (Oral)  Ht 5\' 9"  (1.753 m)  Wt 225 lb 4 oz (102.173 kg)  BMI 33.26 kg/m2  SpO2 98% Physical Exam  VS noted Constitutional: Pt is oriented to person, place, and time. Appears well-developed and well-nourished.  HENT:  Head: Normocephalic and atraumatic.  Right Ear: External ear normal.  Left Ear: External ear normal.  Nose: Nose normal.  Mouth/Throat: Oropharynx is clear and moist.  Eyes: Conjunctivae and EOM are normal. Pupils are equal, round, and reactive to light.  Neck: Normal range of motion. Neck supple. No JVD present. No tracheal deviation  present.  Cardiovascular: Normal rate, regular rhythm, normal heart sounds and intact distal pulses.   Pulmonary/Chest: Effort normal and breath sounds normal.  Abdominal: Soft. Bowel sounds  are normal. There is no tenderness.  Musculoskeletal: Normal range of motion. Exhibits no edema.  Lymphadenopathy:  Has no cervical adenopathy.  Neurological: Pt is alert and oriented to person, place, and time. Pt has normal reflexes. No cranial nerve deficit. Motor/sens/dtr intact Skin: Skin is warm and dry. No rash noted.  Psychiatric:  Has  normal mood and affect. Behavior is normal.  Spine: nontender, but has ongoing mod bilat diffuse lumbar paravertebral tender       Assessment & Plan:

## 2011-04-19 NOTE — Assessment & Plan Note (Signed)
Chronic ongoing for several yrs, well maintained, but not well defined/characterized;  For MRI LS spine, try to wean vicodin to 7.5/325 qid, f/u 3 mo, consider ortho eval

## 2011-04-19 NOTE — Assessment & Plan Note (Signed)
To add lexapro 10 qd, may be able to wean xanax eventually, .f/u next visit

## 2011-04-26 ENCOUNTER — Other Ambulatory Visit: Payer: BC Managed Care – PPO

## 2011-07-18 ENCOUNTER — Other Ambulatory Visit: Payer: Self-pay

## 2011-07-18 DIAGNOSIS — F411 Generalized anxiety disorder: Secondary | ICD-10-CM

## 2011-07-18 MED ORDER — ALPRAZOLAM 0.5 MG PO TABS: 0.5000 mg | ORAL_TABLET | Freq: Three times a day (TID) | ORAL | Status: DC | PRN

## 2011-07-18 MED ORDER — HYDROCODONE-ACETAMINOPHEN 7.5-325 MG PO TABS: 1.0000 | ORAL_TABLET | Freq: Four times a day (QID) | ORAL | Status: DC | PRN

## 2011-07-18 NOTE — Telephone Encounter (Signed)
Done hardcopy to robin  

## 2011-07-18 NOTE — Telephone Encounter (Signed)
Faxed hardcopy to pharmacy. 

## 2011-07-18 NOTE — Telephone Encounter (Signed)
Patient informed prescription requested is ready for pickup. 

## 2011-07-22 ENCOUNTER — Ambulatory Visit: Payer: BC Managed Care – PPO | Admitting: Internal Medicine

## 2011-10-10 ENCOUNTER — Other Ambulatory Visit: Payer: Self-pay

## 2011-10-10 DIAGNOSIS — F411 Generalized anxiety disorder: Secondary | ICD-10-CM

## 2011-10-10 MED ORDER — ALPRAZOLAM 0.5 MG PO TABS: 0.5000 mg | ORAL_TABLET | Freq: Three times a day (TID) | ORAL | Status: DC | PRN

## 2011-10-10 MED ORDER — HYDROCODONE-ACETAMINOPHEN 7.5-325 MG PO TABS: 1.0000 | ORAL_TABLET | Freq: Four times a day (QID) | ORAL | Status: DC | PRN

## 2011-10-10 NOTE — Telephone Encounter (Signed)
Done hardcopy to robin  

## 2011-10-10 NOTE — Telephone Encounter (Signed)
Faxed hardcopy to pharmacy. 

## 2012-01-02 ENCOUNTER — Other Ambulatory Visit: Payer: Self-pay

## 2012-01-02 MED ORDER — HYDROCODONE-ACETAMINOPHEN 7.5-325 MG PO TABS: 1.0000 | ORAL_TABLET | Freq: Four times a day (QID) | ORAL | Status: DC | PRN

## 2012-01-02 NOTE — Telephone Encounter (Signed)
Faxed hardcopy to pharmacy. 

## 2012-01-02 NOTE — Telephone Encounter (Signed)
Done hardcopy to robin  

## 2012-01-20 ENCOUNTER — Other Ambulatory Visit: Payer: Self-pay

## 2012-01-20 DIAGNOSIS — F411 Generalized anxiety disorder: Secondary | ICD-10-CM

## 2012-01-20 MED ORDER — ALPRAZOLAM 0.5 MG PO TABS: 0.5000 mg | ORAL_TABLET | Freq: Three times a day (TID) | ORAL | Status: DC | PRN

## 2012-01-20 NOTE — Telephone Encounter (Signed)
Faxed hardcopy to pharmacy. 

## 2012-04-02 ENCOUNTER — Other Ambulatory Visit: Payer: Self-pay | Admitting: Internal Medicine

## 2012-04-02 NOTE — Telephone Encounter (Signed)
Done hardcopy to robin  

## 2012-04-03 NOTE — Telephone Encounter (Signed)
Faxed hardcopy to pharmacy. 

## 2012-04-27 ENCOUNTER — Other Ambulatory Visit: Payer: Self-pay | Admitting: Endocrinology

## 2012-04-27 ENCOUNTER — Other Ambulatory Visit: Payer: Self-pay | Admitting: Internal Medicine

## 2012-04-27 MED ORDER — HYDROCODONE-ACETAMINOPHEN 7.5-325 MG PO TABS: 1.0000 | ORAL_TABLET | Freq: Four times a day (QID) | ORAL | Status: DC | PRN

## 2012-04-27 NOTE — Telephone Encounter (Signed)
Called the patient to inform to schedule appt.  He has scheduled for 05/02/12.  Please advise on refill.

## 2012-04-27 NOTE — Telephone Encounter (Signed)
Due OV

## 2012-04-27 NOTE — Telephone Encounter (Signed)
Faxed hardcopy to pharmacy. 

## 2012-04-27 NOTE — Telephone Encounter (Signed)
Done hardcopy to robin  

## 2012-04-28 ENCOUNTER — Other Ambulatory Visit: Payer: Self-pay | Admitting: Internal Medicine

## 2012-04-30 NOTE — Telephone Encounter (Signed)
Last xanax rx done nov 8 - too soon for refills  Pt due for ROV as well- robin to let pt know

## 2012-05-02 ENCOUNTER — Ambulatory Visit (INDEPENDENT_AMBULATORY_CARE_PROVIDER_SITE_OTHER): Payer: BC Managed Care – PPO | Admitting: Internal Medicine

## 2012-05-02 ENCOUNTER — Encounter: Payer: Self-pay | Admitting: Internal Medicine

## 2012-05-02 VITALS — BP 122/80 | HR 76 | Temp 97.9°F | Ht 69.0 in | Wt 219.4 lb

## 2012-05-02 DIAGNOSIS — Z0000 Encounter for general adult medical examination without abnormal findings: Secondary | ICD-10-CM

## 2012-05-02 DIAGNOSIS — M545 Low back pain, unspecified: Secondary | ICD-10-CM

## 2012-05-02 DIAGNOSIS — E785 Hyperlipidemia, unspecified: Secondary | ICD-10-CM

## 2012-05-02 DIAGNOSIS — F411 Generalized anxiety disorder: Secondary | ICD-10-CM

## 2012-05-02 DIAGNOSIS — G8929 Other chronic pain: Secondary | ICD-10-CM

## 2012-05-02 MED ORDER — HYDROCODONE-ACETAMINOPHEN 7.5-325 MG PO TABS: 1.0000 | ORAL_TABLET | Freq: Four times a day (QID) | ORAL | Status: DC | PRN

## 2012-05-02 MED ORDER — ALPRAZOLAM 0.5 MG PO TABS: 0.5000 mg | ORAL_TABLET | Freq: Three times a day (TID) | ORAL | Status: DC | PRN

## 2012-05-02 NOTE — Patient Instructions (Addendum)
You had the flu shot today You are given the refills today Continue all other medications as before Please have the pharmacy call with any other refills you may need. Please continue your efforts at being more active, low cholesterol diet, and weight control. Please return in 6 mo with Lab testing done 3-5 days before

## 2012-05-02 NOTE — Progress Notes (Signed)
  Subjective:    Patient ID: Gabriel Moon, male    DOB: Oct 11, 1970, 41 y.o.   MRN: [id number]  HPI  Here to f/u; overall doing ok,  Pt denies chest pain, increased sob or doe, wheezing, orthopnea, PND, increased LE swelling, palpitations, dizziness or syncope.  Pt denies new neurological symptoms such as new headache, or facial or extremity weakness or numbness   Pt denies polydipsia, polyuria, Pt states overall good compliance with meds, trying to follow lower cholesterol diet, wt overall stable but little exercise however.  Denies worsening depressive symptoms, suicidal ideation, or panic, though has ongoing anxiety.  Could not tolerate the lexapro.   Still with chronic LBP, Dr Magdalene River [facility], with nonfusion part of nornmal spine with subsequent trauma leading to rucurrent spasms. Sent for PT, warned could be up to 2 yrs to get improved results.  No surgury needed, no steroid shots planned.  Working 2 jobs - 1 full- [employer], 1 partime  - [employer] (no lifting more than 20 lbs). Married, 3 kids. Due flu shot today Past Medical History  Diagnosis Date  . Chronic LBP 04/19/2011  . HYPERLIPIDEMIA 01/13/2007  . ANXIETY 01/13/2007  . ALLERGIC RHINITIS 01/13/2007  . NEPHROLITHIASIS, HX OF 04/26/2007   No past surgical history on file.  reports that he has quit smoking. He does not have any smokeless tobacco history on file. He reports that he drinks alcohol. He reports that he does not use illicit drugs. family history is not on file. Allergies  Allergen Reactions  . Lexapro (Escitalopram Oxalate)    Current Outpatient Prescriptions on File Prior to Visit  Medication Sig Dispense Refill  . cetirizine (ZYRTEC) 10 MG tablet Take 1 tablet (10 mg total) by mouth daily.  90 tablet  3  . fluticasone (FLONASE) 50 MCG/ACT nasal spray Place 2 sprays into the nose daily.  16 g  2   Review of Systems  Constitutional: Negative for diaphoresis and unexpected weight  change.  HENT: Negative for tinnitus.   Eyes: Negative for photophobia and visual disturbance.  Respiratory: Negative for choking and stridor.   Gastrointestinal: Negative for vomiting and blood in stool.  Genitourinary: Negative for hematuria and decreased urine volume.  Musculoskeletal: Negative for gait problem.  Skin: Negative for color change and wound.  Neurological: Negative for tremors and numbness.  Psychiatric/Behavioral: Negative for decreased concentration. The patient is not hyperactive.       Objective:   Physical Exam BP 122/80  Pulse 76  Temp 97.9 F (36.6 C) (Oral)  Ht 5\' 9"  (1.753 m)  Wt 219 lb 6 oz (99.508 kg)  BMI 32.40 kg/m2  SpO2 99% Physical Exam  VS noted Constitutional: Pt appears well-developed and well-nourished.  HENT: Head: Normocephalic.  Right Ear: External ear normal.  Left Ear: External ear normal.  Eyes: Conjunctivae and EOM are normal. Pupils are equal, round, and reactive to light.  Neck: Normal range of motion. Neck supple.  Cardiovascular: Normal rate and regular rhythm.   Pulmonary/Chest: Effort normal and breath sounds normal.  Abd:  Soft, NT, non-distended, + BS Neurological: Pt is alert. Not confused  Skin: Skin is warm. No erythema.  Psychiatric: Pt behavior is normal. Thought content normal.     Assessment & Plan:

## 2012-05-06 ENCOUNTER — Encounter: Payer: Self-pay | Admitting: Internal Medicine

## 2012-05-06 NOTE — Assessment & Plan Note (Signed)
stable overall by hx and exam, most recent data reviewed with pt, and pt to continue medical treatment as before, consider statin/declines today, for lower chol diet Lab Results  Component Value Date   CHOL 230* 04/19/2011   HDL 39.40 04/19/2011   LDLDIRECT 164.0 04/19/2011   TRIG 251.0* 04/19/2011   CHOLHDL 6 04/19/2011

## 2012-05-06 NOTE — Assessment & Plan Note (Signed)
stable overall by hx and exam, most recent data reviewed with pt, and pt to continue medical treatment as before, for refills

## 2012-05-06 NOTE — Assessment & Plan Note (Signed)
Declines further ssri trial o/w stable overall by hx and exam, and pt to continue medical treatment as before

## 2012-10-02 ENCOUNTER — Other Ambulatory Visit: Payer: Self-pay | Admitting: Internal Medicine

## 2012-10-02 NOTE — Telephone Encounter (Signed)
Faxed hardcopy to pharmacy. 

## 2012-10-02 NOTE — Telephone Encounter (Signed)
Done hardcopy to robin  

## 2012-10-24 ENCOUNTER — Ambulatory Visit (INDEPENDENT_AMBULATORY_CARE_PROVIDER_SITE_OTHER): Payer: BC Managed Care – PPO | Admitting: Internal Medicine

## 2012-10-24 ENCOUNTER — Other Ambulatory Visit (INDEPENDENT_AMBULATORY_CARE_PROVIDER_SITE_OTHER): Payer: BC Managed Care – PPO

## 2012-10-24 ENCOUNTER — Encounter: Payer: Self-pay | Admitting: Internal Medicine

## 2012-10-24 VITALS — BP 130/82 | HR 70 | Temp 98.4°F | Ht 69.0 in | Wt 230.2 lb

## 2012-10-24 DIAGNOSIS — Z0000 Encounter for general adult medical examination without abnormal findings: Secondary | ICD-10-CM

## 2012-10-24 DIAGNOSIS — G8929 Other chronic pain: Secondary | ICD-10-CM

## 2012-10-24 DIAGNOSIS — M545 Low back pain, unspecified: Secondary | ICD-10-CM

## 2012-10-24 DIAGNOSIS — J309 Allergic rhinitis, unspecified: Secondary | ICD-10-CM

## 2012-10-24 DIAGNOSIS — F411 Generalized anxiety disorder: Secondary | ICD-10-CM

## 2012-10-24 LAB — TSH: TSH: 1.87 u[IU]/mL (ref 0.35–5.50)

## 2012-10-24 LAB — URINALYSIS, ROUTINE W REFLEX MICROSCOPIC
Leukocytes, UA: NEGATIVE
Specific Gravity, Urine: 1.025 (ref 1.000–1.030)
Urobilinogen, UA: 0.2 (ref 0.0–1.0)

## 2012-10-24 LAB — BASIC METABOLIC PANEL
BUN: 19 mg/dL (ref 6–23)
Chloride: 105 mEq/L (ref 96–112)
Creatinine, Ser: 0.9 mg/dL (ref 0.4–1.5)
GFR: 101.5 mL/min (ref >60.00)
Potassium: 4.1 mEq/L (ref 3.5–5.1)

## 2012-10-24 LAB — CBC WITH DIFFERENTIAL/PLATELET
Basophils Absolute: 0 10*3/uL (ref 0.0–0.1)
Lymphocytes Relative: 29 % (ref 12.0–46.0)
Monocytes Relative: 8.2 % (ref 3.0–12.0)
Platelets: 252 10*3/uL (ref 150.0–400.0)
RDW: 13.2 % (ref 11.5–14.6)

## 2012-10-24 LAB — LDL CHOLESTEROL, DIRECT: Direct LDL: 116.1 mg/dL

## 2012-10-24 LAB — HEPATIC FUNCTION PANEL
Albumin: 4.1 g/dL (ref 3.5–5.2)
Bilirubin, Direct: 0 mg/dL (ref 0.0–0.3)
Total Bilirubin: 0.3 mg/dL (ref 0.3–1.2)
Total Protein: 6.6 g/dL (ref 6.0–8.3)

## 2012-10-24 LAB — LIPID PANEL
Cholesterol: 216 mg/dL — ABNORMAL HIGH (ref 0–200)
HDL: 29.9 mg/dL — ABNORMAL LOW (ref >39.00)
Total CHOL/HDL Ratio: 7
VLDL: 114 mg/dL — ABNORMAL HIGH (ref 0.0–40.0)

## 2012-10-24 LAB — PSA: PSA: 0.5 ng/mL (ref 0.10–4.00)

## 2012-10-24 MED ORDER — ALPRAZOLAM 0.5 MG PO TABS: 0.5000 mg | ORAL_TABLET | Freq: Three times a day (TID) | ORAL | Status: DC | PRN

## 2012-10-24 MED ORDER — HYDROCODONE-ACETAMINOPHEN 7.5-325 MG PO TABS: 1.0000 | ORAL_TABLET | Freq: Four times a day (QID) | ORAL | Status: DC | PRN

## 2012-10-24 MED ORDER — FLUTICASONE PROPIONATE 50 MCG/ACT NA SUSP: 2.0000 | Freq: Every day | NASAL | Status: DC

## 2012-10-24 MED ORDER — HYDROCODONE-ACETAMINOPHEN 7.5-325 MG PO TABS: ORAL_TABLET | ORAL | Status: DC
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
## 2012-10-24 NOTE — Assessment & Plan Note (Signed)
stable overall by history and exam, and pt to continue medical treatment as before,  to f/u any worsening symptoms or concerns 

## 2012-10-24 NOTE — Assessment & Plan Note (Signed)
stable overall by history and exam, recent data reviewed with pt, and pt to continue medical treatment as before,  to f/u any worsening symptoms or concerns Lab Results  Component Value Date   WBC 5.6 04/19/2011   HGB 14.9 04/19/2011   HCT 42.7 04/19/2011   PLT 246.0 04/19/2011   GLUCOSE 92 04/19/2011   CHOL 230* 04/19/2011   TRIG 251.0* 04/19/2011   HDL 39.40 04/19/2011   LDLDIRECT 164.0 04/19/2011   ALT 34 04/19/2011   AST 16 04/19/2011   NA 142 04/19/2011   K 3.9 04/19/2011   CL 108 04/19/2011   CREATININE 0.8 04/19/2011   BUN 16 04/19/2011   CO2 27 04/19/2011   TSH 1.85 04/19/2011

## 2012-10-24 NOTE — Assessment & Plan Note (Signed)

## 2012-10-24 NOTE — Patient Instructions (Addendum)
Please return if you change your mind about the tetanus update Please continue all other medications as before, and refills have been done if requested. Please continue your efforts at being more active, low cholesterol diet, and weight control. You are otherwise up to date with prevention measures today. You will be contacted by phone if any changes need to be made immediately based on your lab work.  Otherwise, you will receive a letter about your results with an explanation, but please check with MyChart first. Thank you for enrolling in MyChart. Please follow the instructions below to securely access your online medical record. MyChart allows you to send messages to your doctor, view your test results, renew your prescriptions, schedule appointments, and more To Log into My Chart online, please go by [facility] or [employer] to [email or web address], or download the MyChart App from the [employer] .  Your Username is:  [email or web address] (pass pirates) Please return in 1 year for your yearly visit, or sooner if needed, with Lab testing done 3-5 days before

## 2012-10-25 ENCOUNTER — Ambulatory Visit: Payer: BC Managed Care – PPO

## 2012-10-25 DIAGNOSIS — R7309 Other abnormal glucose: Secondary | ICD-10-CM

## 2012-10-25 LAB — HEMOGLOBIN A1C: Hgb A1c MFr Bld: 5.5 % (ref 4.6–6.5)

## 2013-04-23 ENCOUNTER — Other Ambulatory Visit: Payer: Self-pay | Admitting: Internal Medicine

## 2013-04-23 MED ORDER — HYDROCODONE-ACETAMINOPHEN 7.5-325 MG PO TABS: ORAL_TABLET | ORAL | Status: DC

## 2013-04-23 NOTE — Telephone Encounter (Signed)
Faxed hardcopy to [facility] and informed to pickup hardcopy's at the front desk, did inform of letter.

## 2013-04-23 NOTE — Telephone Encounter (Signed)
Done hardcopy to robin, also to inform pt  You are given the letter today explaining the transitional pain medication refill policy due to recent change in [place] Law  Please be aware that I will no longer be able to offer monthly refills of any Schedule II or higher medication starting Jul 21, 2013

## 2013-04-23 NOTE — Telephone Encounter (Signed)
Done hardcopy to robin  

## 2013-04-23 NOTE — Telephone Encounter (Signed)
Pt called requesting Hydrocodone refill.  Please advise 

## 2013-04-25 ENCOUNTER — Other Ambulatory Visit: Payer: Self-pay

## 2013-10-23 ENCOUNTER — Other Ambulatory Visit: Payer: Self-pay | Admitting: Internal Medicine

## 2013-10-23 NOTE — Telephone Encounter (Signed)
Done hardcopy to robin  

## 2013-10-23 NOTE — Telephone Encounter (Signed)
[facility]

## 2013-11-05 ENCOUNTER — Other Ambulatory Visit: Payer: Self-pay | Admitting: Internal Medicine

## 2013-11-15 ENCOUNTER — Encounter: Payer: Self-pay | Admitting: Internal Medicine

## 2013-11-15 ENCOUNTER — Ambulatory Visit (INDEPENDENT_AMBULATORY_CARE_PROVIDER_SITE_OTHER): Payer: BC Managed Care – PPO | Admitting: Internal Medicine

## 2013-11-15 ENCOUNTER — Telehealth: Payer: Self-pay | Admitting: *Deleted

## 2013-11-15 ENCOUNTER — Other Ambulatory Visit (INDEPENDENT_AMBULATORY_CARE_PROVIDER_SITE_OTHER): Payer: BC Managed Care – PPO

## 2013-11-15 VITALS — BP 120/82 | HR 79 | Temp 98.5°F | Ht 69.0 in | Wt 237.5 lb

## 2013-11-15 DIAGNOSIS — E291 Testicular hypofunction: Secondary | ICD-10-CM

## 2013-11-15 DIAGNOSIS — F411 Generalized anxiety disorder: Secondary | ICD-10-CM

## 2013-11-15 DIAGNOSIS — Z0000 Encounter for general adult medical examination without abnormal findings: Secondary | ICD-10-CM

## 2013-11-15 DIAGNOSIS — M6740 Ganglion, unspecified site: Secondary | ICD-10-CM

## 2013-11-15 DIAGNOSIS — G4710 Hypersomnia, unspecified: Secondary | ICD-10-CM

## 2013-11-15 LAB — IBC PANEL
Iron: 82 ug/dL (ref 42–165)
Saturation Ratios: 19.1 % — ABNORMAL LOW (ref 20.0–50.0)
Transferrin: 307.4 mg/dL (ref 212.0–360.0)

## 2013-11-15 LAB — PSA: PSA: 0.72 ng/mL (ref 0.10–4.00)

## 2013-11-15 LAB — LUTEINIZING HORMONE: LH: 2.7 m[IU]/mL (ref 1.50–9.30)

## 2013-11-15 LAB — CORTISOL: CORTISOL PLASMA: 8.4 ug/dL

## 2013-11-15 NOTE — Patient Instructions (Signed)
Please continue all other medications as before, and refills have been done if requested. Please have the pharmacy call with any other refills you may need.  You will be contacted regarding the referral for: pulmonary  Please go to the LAB in the Basement (turn left off the elevator) for the tests to be done today  You will be contacted by phone if any changes need to be made immediately.  Otherwise, you will receive a letter about your results with an explanation, but please check with MyChart first.  Please remember to sign up for MyChart if you have not done so, as this will be important to you in the future with finding out test results, communicating by private email, and scheduling acute appointments online when needed.  Please return in 1 year for your yearly visit, or sooner if needed

## 2013-11-15 NOTE — Assessment & Plan Note (Signed)
Right wrist and right ankle - declines referral at this time, mild

## 2013-11-15 NOTE — Telephone Encounter (Signed)
Pt called states he needs a work note for today's visit.  Please advise

## 2013-11-15 NOTE — Progress Notes (Signed)
Pre visit review using our clinic review tool, if applicable. No additional management support is needed unless otherwise documented below in the visit note. 

## 2013-11-15 NOTE — Telephone Encounter (Signed)
Completed letter as requested, faxed to [phone].  Patient is aware.

## 2013-11-15 NOTE — Assessment & Plan Note (Signed)

## 2013-11-15 NOTE — Progress Notes (Signed)
Subjective:    Patient ID: Gabriel Moon, male    DOB: Dec 10, 1970, 43 y.o.   MRN: [id number]  HPI  Here for wellness and f/u;  Overall doing ok;  Pt denies CP, worsening SOB, DOE, wheezing, orthopnea, PND, worsening LE edema, palpitations, dizziness or syncope.  Pt denies neurological change such as new headache, facial or extremity weakness.  Pt denies polydipsia, polyuria, or low sugar symptoms. Pt states overall good compliance with treatment and medications, good tolerability, and has been trying to follow lower cholesterol diet.  Pt denies worsening depressive symptoms, suicidal ideation or panic. No fever, night sweats, wt loss, loss of appetite, or other constitutional symptoms.  Pt states good ability with ADL's, has low fall risk, home safety reviewed and adequate, no other significant changes in hearing or vision, and only occasionally active with exercise.  Works [employer], saw city MD wih onychomycosis with oral tx med; but alsomentioned fatigue and increased weight.  Has labs drawn approx 2 wks ago - found low testosterone 203, had f/u lab yesterday for prolactin 4.8 (ok), no LH done. Past Medical History  Diagnosis Date  . Chronic LBP 04/19/2011  . HYPERLIPIDEMIA 01/13/2007  . ANXIETY 01/13/2007  . ALLERGIC RHINITIS 01/13/2007  . NEPHROLITHIASIS, HX OF 04/26/2007   No past surgical history on file.  reports that he has quit smoking. He does not have any smokeless tobacco history on file. He reports that he drinks alcohol. He reports that he does not use illicit drugs. family history is not on file. Allergies  Allergen Reactions  . Lexapro [Escitalopram Oxalate]    Current Outpatient Prescriptions on File Prior to Visit  Medication Sig Dispense Refill  . ALPRAZolam (XANAX) 0.5 MG tablet TAKE 1 TABLET BY MOUTH 3 TIMES A DAY AS NEEDED  90 tablet  0  . cetirizine (ZYRTEC) 10 MG tablet Take 1 tablet (10 mg total) by mouth daily.  90 tablet  3  .  fluticasone (FLONASE) 50 MCG/ACT nasal spray PLACE 2 SPRAYS INTO THE NOSE DAILY.  16 g  1   No current facility-administered medications on file prior to visit.   Review of Systems Constitutional: Negative for increased diaphoresis, other activity, appetite or other siginficant weight change  HENT: Negative for worsening hearing loss, ear pain, facial swelling, mouth sores and neck stiffness.   Eyes: Negative for other worsening pain, redness or visual disturbance.  Respiratory: Negative for shortness of breath and wheezing.   Cardiovascular: Negative for chest pain and palpitations.  Gastrointestinal: Negative for diarrhea, blood in stool, abdominal distention or other pain Genitourinary: Negative for hematuria, flank pain or change in urine volume.  Musculoskeletal: Negative for myalgias or other joint complaints.  Skin: Negative for color change and wound.  Neurological: Negative for syncope and numbness. other than noted Hematological: Negative for adenopathy. or other swelling Psychiatric/Behavioral: Negative for hallucinations, self-injury, decreased concentration or other worsening agitation.      Objective:   Physical Exam BP 120/82  Pulse 79  Temp(Src) 98.5 F (36.9 C) (Oral)  Ht 5\' 9"  (1.753 m)  Wt 237 lb 8 oz (107.729 kg)  BMI 35.06 kg/m2  SpO2 97% VS noted,  Constitutional: Pt is oriented to person, place, and time. Appears well-developed and well-nourished. /morbid obese Head: Normocephalic and atraumatic.  Right Ear: External ear normal.  Left Ear: External ear normal.  Nose: Nose normal.  Mouth/Throat: Oropharynx is clear and moist.  Eyes: Conjunctivae and EOM are normal. Pupils are  equal, round, and reactive to light.  Neck: Normal range of motion. Neck supple. No JVD present. No tracheal deviation present.  Cardiovascular: Normal rate, regular rhythm, normal heart sounds and intact distal pulses.   Pulmonary/Chest: Effort normal and breath sounds without rales  or wheezing  Abdominal: Soft. Bowel sounds are normal. NT. No HSM  Musculoskeletal: Normal range of motion. Exhibits no edema.  Lymphadenopathy:  Has no cervical adenopathy.  Neurological: Pt is alert and oriented to person, place, and time. Pt has normal reflexes. No cranial nerve deficit. Motor grossly intact Skin: Skin is warm and dry. No rash noted. small tender ganglion cyst noted right wrist 1st dorsal compartment Psychiatric:  Has normal mood and affect. Behavior is normal.     Assessment & Plan:

## 2013-11-16 LAB — ESTRADIOL: Estradiol: 62 pg/mL — ABNORMAL HIGH

## 2013-11-16 NOTE — Assessment & Plan Note (Signed)
For iron, LH, estadiol, consider clomid vs androgel replacement tx

## 2013-11-16 NOTE — Assessment & Plan Note (Signed)
Stable, for med refill 

## 2013-11-16 NOTE — Assessment & Plan Note (Signed)
?   [facility] - for pulm referral 

## 2013-11-20 ENCOUNTER — Other Ambulatory Visit: Payer: Self-pay | Admitting: Internal Medicine

## 2013-11-20 NOTE — Telephone Encounter (Signed)
[facility]axed [facility] to [facility] [place]

## 2013-12-03 ENCOUNTER — Encounter: Payer: Self-pay | Admitting: Internal Medicine

## 2013-12-22 ENCOUNTER — Other Ambulatory Visit: Payer: Self-pay | Admitting: Internal Medicine

## 2013-12-23 NOTE — Telephone Encounter (Signed)
[facility] 

## 2014-01-23 ENCOUNTER — Other Ambulatory Visit: Payer: Self-pay | Admitting: Internal Medicine

## 2014-01-24 NOTE — Telephone Encounter (Signed)
[facility]axed [facility] 

## 2014-01-24 NOTE — Telephone Encounter (Signed)
Done hardcopy to robin  

## 2014-04-21 ENCOUNTER — Other Ambulatory Visit: Payer: Self-pay | Admitting: Internal Medicine

## 2014-04-24 ENCOUNTER — Other Ambulatory Visit: Payer: Self-pay | Admitting: Internal Medicine

## 2014-04-24 NOTE — Telephone Encounter (Signed)
[facility]

## 2014-04-24 NOTE — Telephone Encounter (Signed)
Done hardcopy to robin  

## 2014-05-13 ENCOUNTER — Ambulatory Visit: Payer: Self-pay | Admitting: General Practice

## 2014-07-29 ENCOUNTER — Other Ambulatory Visit: Payer: Self-pay | Admitting: Internal Medicine

## 2014-07-30 NOTE — Telephone Encounter (Signed)
Done hardcopy to [facility]  

## 2014-07-30 NOTE — Telephone Encounter (Signed)
[facility] 

## 2014-09-17 ENCOUNTER — Other Ambulatory Visit (INDEPENDENT_AMBULATORY_CARE_PROVIDER_SITE_OTHER): Payer: BC Managed Care – PPO

## 2014-09-17 ENCOUNTER — Other Ambulatory Visit: Payer: Self-pay

## 2014-09-17 ENCOUNTER — Ambulatory Visit (INDEPENDENT_AMBULATORY_CARE_PROVIDER_SITE_OTHER): Payer: BLUE CROSS/BLUE SHIELD | Admitting: Internal Medicine

## 2014-09-17 ENCOUNTER — Encounter: Payer: Self-pay | Admitting: Internal Medicine

## 2014-09-17 VITALS — BP 134/88 | HR 94 | Temp 98.5°F | Resp 18 | Ht 69.0 in | Wt 230.0 lb

## 2014-09-17 DIAGNOSIS — Z0000 Encounter for general adult medical examination without abnormal findings: Secondary | ICD-10-CM

## 2014-09-17 LAB — URINALYSIS, ROUTINE W REFLEX MICROSCOPIC
Bilirubin Urine: NEGATIVE
Hgb urine dipstick: NEGATIVE
KETONES UR: NEGATIVE
Leukocytes, UA: NEGATIVE
Nitrite: NEGATIVE
RBC / HPF: NONE SEEN (ref 0–?)
SPECIFIC GRAVITY, URINE: 1.02 (ref 1.000–1.030)
Total Protein, Urine: NEGATIVE
Urine Glucose: NEGATIVE
Urobilinogen, UA: 0.2 (ref 0.0–1.0)
pH: 6.5 (ref 5.0–8.0)

## 2014-09-17 LAB — LIPID PANEL
CHOLESTEROL: 174 mg/dL (ref 0–200)
HDL: 30.9 mg/dL — AB (ref >39.00)
LDL CALC: 113 mg/dL — AB (ref 0–99)
NonHDL: 143.1
TRIGLYCERIDES: 152 mg/dL — AB (ref 0.0–149.0)
Total CHOL/HDL Ratio: 6
VLDL: 30.4 mg/dL (ref 0.0–40.0)

## 2014-09-17 LAB — CBC WITH DIFFERENTIAL/PLATELET
BASOS ABS: 0 10*3/uL (ref 0.0–0.1)
BASOS PCT: 0.5 % (ref 0.0–3.0)
Eosinophils Absolute: 0.1 10*3/uL (ref 0.0–0.7)
Eosinophils Relative: 1.7 % (ref 0.0–5.0)
HEMATOCRIT: 49.4 % (ref 39.0–52.0)
Hemoglobin: 17.1 g/dL — ABNORMAL HIGH (ref 13.0–17.0)
LYMPHS ABS: 1.5 10*3/uL (ref 0.7–4.0)
Lymphocytes Relative: 21.1 % (ref 12.0–46.0)
MCHC: 34.5 g/dL (ref 30.0–36.0)
MCV: 87 fl (ref 78.0–100.0)
MONOS PCT: 5.8 % (ref 3.0–12.0)
Monocytes Absolute: 0.4 10*3/uL (ref 0.1–1.0)
NEUTROS ABS: 4.9 10*3/uL (ref 1.4–7.7)
Neutrophils Relative %: 70.9 % (ref 43.0–77.0)
PLATELETS: 248 10*3/uL (ref 150.0–400.0)
RBC: 5.69 Mil/uL (ref 4.22–5.81)
RDW: 13.7 % (ref 11.5–15.5)
WBC: 6.9 10*3/uL (ref 4.0–10.5)

## 2014-09-17 LAB — HEPATIC FUNCTION PANEL
ALK PHOS: 67 U/L (ref 39–117)
ALT: 25 U/L (ref 0–53)
AST: 13 U/L (ref 0–37)
Albumin: 4.3 g/dL (ref 3.5–5.2)
BILIRUBIN TOTAL: 0.5 mg/dL (ref 0.2–1.2)
Bilirubin, Direct: 0.1 mg/dL (ref 0.0–0.3)
Total Protein: 7.3 g/dL (ref 6.0–8.3)

## 2014-09-17 LAB — BASIC METABOLIC PANEL
BUN: 16 mg/dL (ref 6–23)
CALCIUM: 9.8 mg/dL (ref 8.4–10.5)
CHLORIDE: 104 meq/L (ref 96–112)
CO2: 32 meq/L (ref 19–32)
CREATININE: 1.26 mg/dL (ref 0.40–1.50)
GFR: 66.46 mL/min (ref >60.00)
Glucose, Bld: 113 mg/dL — ABNORMAL HIGH (ref 70–99)
POTASSIUM: 4.3 meq/L (ref 3.5–5.1)
SODIUM: 139 meq/L (ref 135–145)

## 2014-09-17 LAB — TSH: TSH: 2.07 u[IU]/mL (ref 0.35–4.50)

## 2014-09-17 NOTE — Progress Notes (Signed)
Subjective:    Patient ID: Gabriel Moon, male    DOB: 21-Feb-1971, 44 y.o.   MRN: [id number]  HPI  Here for wellness and f/u;  Overall doing ok;  Pt denies Chest pain, worsening SOB, DOE, wheezing, orthopnea, PND, worsening LE edema, palpitations, dizziness or syncope.  Pt denies neurological change such as new headache, facial or extremity weakness.  Pt denies polydipsia, polyuria, or low sugar symptoms. Pt states overall good compliance with treatment and medications, good tolerability, and has been trying to follow appropriate diet.  Pt denies worsening depressive symptoms, suicidal ideation or panic. No fever, night sweats, wt loss, loss of appetite, or other constitutional symptoms.  Pt states good ability with ADL's, has low fall risk, home safety reviewed and adequate, no other significant changes in hearing or vision, and only occasionally active with exercise. Has city MD following him for low testosterone, has been on clomid.  Has chronic right ankle pain, recent xrays, saw ortho, tx with mobic, may need surgury eventually. Past Medical History  Diagnosis Date  . Chronic LBP 04/19/2011  . HYPERLIPIDEMIA 01/13/2007  . ANXIETY 01/13/2007  . ALLERGIC RHINITIS 01/13/2007  . NEPHROLITHIASIS, HX OF 04/26/2007   No past surgical history on file.  reports that he has quit smoking. He does not have any smokeless tobacco history on file. He reports that he drinks alcohol. He reports that he does not use illicit drugs. family history is not on file. Allergies  Allergen Reactions  . Lexapro [Escitalopram Oxalate]    Current Outpatient Prescriptions on File Prior to Visit  Medication Sig Dispense Refill  . ALPRAZolam (XANAX) 0.5 MG tablet TAKE 1 TABLET BY MOUTH 3 TIMES A DAY 90 tablet 2  . cetirizine (ZYRTEC) 10 MG tablet Take 1 tablet (10 mg total) by mouth daily. 90 tablet 3  . fluticasone (FLONASE) 50 MCG/ACT nasal spray PLACE 2 SPRAYS INTO THE NOSE DAILY. 16 g 1   No current  facility-administered medications on file prior to visit.   Review of Systems Constitutional: Negative for increased diaphoresis, other activity, appetite or siginficant weight change other than noted HENT: Negative for worsening hearing loss, ear pain, facial swelling, mouth sores and neck stiffness.   Eyes: Negative for other worsening pain, redness or visual disturbance.  Respiratory: Negative for shortness of breath and wheezing  Cardiovascular: Negative for chest pain and palpitations.  Gastrointestinal: Negative for diarrhea, blood in stool, abdominal distention or other pain Genitourinary: Negative for hematuria, flank pain or change in urine volume.  Musculoskeletal: Negative for myalgias or other joint complaints.  Skin: Negative for color change and wound or drainage.  Neurological: Negative for syncope and numbness. other than noted Hematological: Negative for adenopathy. or other swelling Psychiatric/Behavioral: Negative for hallucinations, SI, self-injury, decreased concentration or other worsening agitation.      Objective:   Physical Exam BP 134/88 mmHg  Pulse 94  Temp(Src) 98.5 F (36.9 C) (Oral)  Resp 18  Ht 5\' 9"  (1.753 m)  Wt 230 lb (104.327 kg)  BMI 33.95 kg/m2  SpO2 97% VS noted,  Constitutional: Pt is oriented to person, place, and time. Appears well-developed and well-nourished, in no significant distress Head: Normocephalic and atraumatic.  Right Ear: External ear normal.  Left Ear: External ear normal.  Nose: Nose normal.  Mouth/Throat: Oropharynx is clear and moist.  Eyes: Conjunctivae and EOM are normal. Pupils are equal, round, and reactive to light.  Neck: Normal range of motion. Neck supple. No JVD present. No tracheal  deviation present or significant neck LA or mass Cardiovascular: Normal rate, regular rhythm, normal heart sounds and intact distal pulses.   Pulmonary/Chest: Effort normal and breath sounds without rales or wheezing  Abdominal:  Soft. Bowel sounds are normal. NT. No HSM  Musculoskeletal: Normal range of motion. Exhibits no edema.  Lymphadenopathy:  Has no cervical adenopathy.  Neurological: Pt is alert and oriented to person, place, and time. Pt has normal reflexes. No cranial nerve deficit. Motor grossly intact Skin: Skin is warm and dry. No rash noted.  Psychiatric:  Has normal mood and affect. Behavior is normal.     Assessment & Plan:

## 2014-09-17 NOTE — Patient Instructions (Signed)
Please continue all other medications as before, and refills have been done if requested.  Please have the pharmacy call with any other refills you may need.  Please continue your efforts at being more active, low cholesterol diet, and weight control.  You are otherwise up to date with prevention measures today.  Please keep your appointments with your specialists as you may have planned  Please return in 1 year for your yearly visit, or sooner if needed, with Lab testing done 3-5 days before  

## 2014-09-17 NOTE — Progress Notes (Signed)
Pre visit review using our clinic review tool, if applicable. No additional management support is needed unless otherwise documented below in the visit note. 

## 2014-09-17 NOTE — Assessment & Plan Note (Signed)

## 2014-10-22 ENCOUNTER — Other Ambulatory Visit: Payer: Self-pay | Admitting: Podiatry

## 2014-10-22 DIAGNOSIS — M898X9 Other specified disorders of bone, unspecified site: Secondary | ICD-10-CM

## 2014-10-22 DIAGNOSIS — M79671 Pain in right foot: Secondary | ICD-10-CM

## 2014-10-22 DIAGNOSIS — M19071 Primary osteoarthritis, right ankle and foot: Secondary | ICD-10-CM

## 2014-10-23 ENCOUNTER — Telehealth: Payer: Self-pay | Admitting: Internal Medicine

## 2014-10-23 NOTE — Telephone Encounter (Signed)
Patient is requesting that we fax something stating that he had a preventative exam with labs on 09/17/14 to his employer. Fax # [phone] it is a secure fax - [employer]

## 2014-10-23 NOTE — Telephone Encounter (Signed)
Letter faxed.

## 2014-10-27 ENCOUNTER — Ambulatory Visit
Admission: RE | Admit: 2014-10-27 | Discharge: 2014-10-27 | Disposition: A | Discharge status: Home / Self Care | Payer: BLUE CROSS/BLUE SHIELD | Source: Ambulatory Visit | Attending: Podiatry | Admitting: Podiatry

## 2014-10-27 DIAGNOSIS — M79671 Pain in right foot: Secondary | ICD-10-CM | POA: Diagnosis not present

## 2014-10-27 DIAGNOSIS — M19071 Primary osteoarthritis, right ankle and foot: Secondary | ICD-10-CM

## 2014-10-27 DIAGNOSIS — M898X9 Other specified disorders of bone, unspecified site: Secondary | ICD-10-CM

## 2014-11-05 ENCOUNTER — Other Ambulatory Visit: Payer: Self-pay

## 2014-11-05 MED ORDER — ALPRAZOLAM 0.5 MG PO TABS: 0.5000 mg | ORAL_TABLET | Freq: Three times a day (TID) | ORAL | Status: DC

## 2014-11-05 NOTE — Telephone Encounter (Signed)
Rx faxed to pharmacy  

## 2014-11-05 NOTE — Telephone Encounter (Signed)
.  one Done hardcopy to Dahlia  

## 2014-11-18 ENCOUNTER — Encounter: Payer: Self-pay | Admitting: Internal Medicine

## 2014-11-19 NOTE — Telephone Encounter (Signed)
Paperwork received and partially filled out. Placed on MDs desk to be completed

## 2014-11-27 ENCOUNTER — Ambulatory Visit: Payer: BLUE CROSS/BLUE SHIELD | Admitting: Anesthesiology

## 2014-11-27 ENCOUNTER — Encounter
Admission: RE | Disposition: A | Discharge status: Home / Self Care | Payer: Self-pay | Source: Ambulatory Visit | Attending: Podiatry

## 2014-11-27 ENCOUNTER — Ambulatory Visit
Admission: RE | Admit: 2014-11-27 | Discharge: 2014-11-27 | Disposition: A | Discharge status: Home / Self Care | Payer: BLUE CROSS/BLUE SHIELD | Source: Ambulatory Visit | Attending: Podiatry | Admitting: Podiatry

## 2014-11-27 ENCOUNTER — Encounter: Payer: Self-pay | Admitting: *Deleted

## 2014-11-27 DIAGNOSIS — M25571 Pain in right ankle and joints of right foot: Secondary | ICD-10-CM | POA: Insufficient documentation

## 2014-11-27 DIAGNOSIS — Z888 Allergy status to other drugs, medicaments and biological substances status: Secondary | ICD-10-CM | POA: Insufficient documentation

## 2014-11-27 DIAGNOSIS — Z87891 Personal history of nicotine dependence: Secondary | ICD-10-CM | POA: Insufficient documentation

## 2014-11-27 DIAGNOSIS — M25774 Osteophyte, right foot: Secondary | ICD-10-CM | POA: Diagnosis not present

## 2014-11-27 DIAGNOSIS — M19071 Primary osteoarthritis, right ankle and foot: Secondary | ICD-10-CM | POA: Diagnosis not present

## 2014-11-27 DIAGNOSIS — G8929 Other chronic pain: Secondary | ICD-10-CM | POA: Insufficient documentation

## 2014-11-27 DIAGNOSIS — Z79899 Other long term (current) drug therapy: Secondary | ICD-10-CM | POA: Insufficient documentation

## 2014-11-27 DIAGNOSIS — M899 Disorder of bone, unspecified: Secondary | ICD-10-CM | POA: Diagnosis not present

## 2014-11-27 DIAGNOSIS — I10 Essential (primary) hypertension: Secondary | ICD-10-CM | POA: Diagnosis not present

## 2014-11-27 DIAGNOSIS — Z1589 Genetic susceptibility to other disease: Secondary | ICD-10-CM

## 2014-11-27 HISTORY — PX: BONE EXOSTOSIS EXCISION: SHX1249

## 2014-11-27 HISTORY — DX: Headache, unspecified: R51.9

## 2014-11-27 HISTORY — DX: Headache: R51

## 2014-11-27 HISTORY — DX: Endocrine disorder, unspecified: E34.9

## 2014-11-27 HISTORY — DX: Allergic rhinitis, unspecified: J30.9

## 2014-11-27 HISTORY — DX: Unspecified osteoarthritis, unspecified site: M19.90

## 2014-11-27 HISTORY — DX: Essential (primary) hypertension: I10

## 2014-11-27 SURGERY — EXCISION, EXOSTOSIS
Anesthesia: Regional | Site: Foot | Laterality: Right | Wound class: Clean

## 2014-11-27 MED ORDER — OXYCODONE HCL 5 MG/5ML PO SOLN: 5.0000 mg | Freq: Once | ORAL | Status: DC | PRN

## 2014-11-27 MED ORDER — OXYCODONE-ACETAMINOPHEN 7.5-325 MG PO TABS: 1.0000 | ORAL_TABLET | ORAL | Status: DC | PRN

## 2014-11-27 MED ORDER — ACETAMINOPHEN 160 MG/5ML PO SOLN: 325.0000 mg | ORAL | Status: DC | PRN

## 2014-11-27 MED ORDER — ONDANSETRON HCL 4 MG/2ML IJ SOLN
INTRAMUSCULAR | Status: DC | PRN
  Administered 2014-11-27: 4 mg via INTRAVENOUS

## 2014-11-27 MED ORDER — ROPIVACAINE HCL 5 MG/ML IJ SOLN
INTRAMUSCULAR | Status: DC | PRN
  Administered 2014-11-27: 25 mL via EPIDURAL
  Administered 2014-11-27: 15 mL via EPIDURAL

## 2014-11-27 MED ORDER — LIDOCAINE HCL (CARDIAC) 20 MG/ML IV SOLN
INTRAVENOUS | Status: DC | PRN
  Administered 2014-11-27: 30 mg via INTRATRACHEAL

## 2014-11-27 MED ORDER — PROPOFOL 10 MG/ML IV BOLUS
INTRAVENOUS | Status: DC | PRN
  Administered 2014-11-27: 200 mg via INTRAVENOUS

## 2014-11-27 MED ORDER — KETOROLAC TROMETHAMINE 30 MG/ML IJ SOLN: 30.0000 mg | Freq: Once | INTRAMUSCULAR | Status: DC | PRN

## 2014-11-27 MED ORDER — OXYCODONE HCL 5 MG PO TABS: 5.0000 mg | ORAL_TABLET | Freq: Once | ORAL | Status: DC | PRN

## 2014-11-27 MED ORDER — HYDROMORPHONE HCL 1 MG/ML IJ SOLN: 0.2500 mg | INTRAMUSCULAR | Status: DC | PRN

## 2014-11-27 MED ORDER — MIDAZOLAM HCL 5 MG/5ML IJ SOLN
INTRAMUSCULAR | Status: DC | PRN
  Administered 2014-11-27 (×2): 2 mg via INTRAVENOUS

## 2014-11-27 MED ORDER — CEFAZOLIN SODIUM-DEXTROSE 2-3 GM-% IV SOLR
2.0000 g | Freq: Once | INTRAVENOUS | Status: AC
  Administered 2014-11-27: 2 g via INTRAVENOUS

## 2014-11-27 MED ORDER — MEPERIDINE HCL 25 MG/ML IJ SOLN: 6.2500 mg | INTRAMUSCULAR | Status: DC | PRN

## 2014-11-27 MED ORDER — LACTATED RINGERS IV SOLN
INTRAVENOUS | Status: DC
  Administered 2014-11-27 (×2): via INTRAVENOUS

## 2014-11-27 MED ORDER — DEXAMETHASONE SODIUM PHOSPHATE 4 MG/ML IJ SOLN
INTRAMUSCULAR | Status: DC | PRN
  Administered 2014-11-27: 4 mg via INTRAVENOUS

## 2014-11-27 MED ORDER — FENTANYL CITRATE (PF) 100 MCG/2ML IJ SOLN
INTRAMUSCULAR | Status: DC | PRN
  Administered 2014-11-27: 100 ug via INTRAVENOUS

## 2014-11-27 MED ORDER — ACETAMINOPHEN 325 MG PO TABS: 325.0000 mg | ORAL_TABLET | ORAL | Status: DC | PRN

## 2014-11-27 MED ORDER — ONDANSETRON HCL 4 MG/2ML IJ SOLN: 4.0000 mg | Freq: Once | INTRAMUSCULAR | Status: DC | PRN

## 2014-11-27 SURGICAL SUPPLY — 68 items
APL SKNCLS STERI-STRIP NONHPOA (GAUZE/BANDAGES/DRESSINGS) ×1
BANDAGE ELASTIC 4 CLIP NS LF (GAUZE/BANDAGES/DRESSINGS) ×2 IMPLANT
BENZOIN TINCTURE PRP APPL 2/3 (GAUZE/BANDAGES/DRESSINGS) ×2 IMPLANT
BLADE CRESCENTIC (BLADE) IMPLANT
BLADE MED AGGRESSIVE (BLADE) ×2 IMPLANT
BLADE MINI RND TIP GREEN BEAV (BLADE) IMPLANT
BLADE OSC/SAGITTAL 5.5X25 (BLADE) IMPLANT
BLADE OSC/SAGITTAL MD 5.5X18 (BLADE) IMPLANT
BLADE OSC/SAGITTAL MD 9X18.5 (BLADE) IMPLANT
BLADE OSCILLATING/SAGITTAL (BLADE)
BLADE SURG 15 STRL LF DISP TIS (BLADE) IMPLANT
BLADE SURG 15 STRL SS (BLADE)
BLADE SW THK.38XMED LNG THN (BLADE) IMPLANT
BNDG CMPR 75X41 PLY HI ABS (GAUZE/BANDAGES/DRESSINGS) ×1
BNDG ESMARK 4X12 TAN STRL LF (GAUZE/BANDAGES/DRESSINGS) ×2 IMPLANT
BNDG GAUZE 4.5X4.1 6PLY STRL (MISCELLANEOUS) ×2 IMPLANT
BNDG STRETCH 4X75 STRL LF (GAUZE/BANDAGES/DRESSINGS) ×2 IMPLANT
BUR EGG 4X8 MED (BURR) IMPLANT
BUR STRYKR EGG 5.0 (BURR) IMPLANT
CANISTER SUCT 1200ML W/VALVE (MISCELLANEOUS) ×2 IMPLANT
CAST PADDING 3X4FT ST 30246 (SOFTGOODS)
COVER PIN YLW 0.028-062 (MISCELLANEOUS) IMPLANT
CUFF TOURN SGL QUICK 18 (TOURNIQUET CUFF) ×1 IMPLANT
DRAPE FLUOR MINI C-ARM 54X84 (DRAPES) ×2 IMPLANT
DRILL WIRE PASS (DRILL) IMPLANT
DURAPREP 26ML APPLICATOR (WOUND CARE) ×2 IMPLANT
ETHIBOND 2 0 GREEN CT 2 30IN (SUTURE) IMPLANT
GAUZE PETRO XEROFOAM 1X8 (MISCELLANEOUS) ×2 IMPLANT
GAUZE PETRO XEROFOAM 5X9 (MISCELLANEOUS) IMPLANT
GAUZE SPONGE 4X4 12PLY STRL (GAUZE/BANDAGES/DRESSINGS) ×2 IMPLANT
GLOVE BIO SURGEON STRL SZ7.5 (GLOVE) ×2 IMPLANT
GLOVE BIO SURGEON STRL SZ8 (GLOVE) ×2 IMPLANT
GLOVE INDICATOR 8.0 STRL GRN (GLOVE) ×2 IMPLANT
GOWN STRL REUS W/ TWL XL LVL3 (GOWN DISPOSABLE) ×2 IMPLANT
GOWN STRL REUS W/TWL XL LVL3 (GOWN DISPOSABLE) ×4
K-WIRE DBL END TROCAR 6X.045 (WIRE)
K-WIRE DBL END TROCAR 6X.062 (WIRE)
KWIRE DBL END TROCAR 6X.045 (WIRE) IMPLANT
KWIRE DBL END TROCAR 6X.062 (WIRE) IMPLANT
NDL HYPO 18GX1.5 BLUNT FILL (NEEDLE) IMPLANT
NDL HYPO 25GX1X1/2 BEV (NEEDLE) IMPLANT
NEEDLE HYPO 18GX1.5 BLUNT FILL (NEEDLE) IMPLANT
NEEDLE HYPO 25GX1X1/2 BEV (NEEDLE) IMPLANT
NS IRRIG 500ML POUR BTL (IV SOLUTION) ×1 IMPLANT
PACK EXTREMITY ARMC (MISCELLANEOUS) ×2 IMPLANT
PAD CAST CTTN 3X4 STRL (SOFTGOODS) IMPLANT
PAD GROUND ADULT SPLIT (MISCELLANEOUS) ×2 IMPLANT
PADDING CAST COTTON 3X4 STRL (SOFTGOODS)
RASP SM TEAR CROSS CUT (RASP) ×2 IMPLANT
SPLINT CAST 1 STEP 4X30 (MISCELLANEOUS) ×1 IMPLANT
SPLINT FAST PLASTER 5X30 (CAST SUPPLIES)
SPLINT PLASTER CAST FAST 5X30 (CAST SUPPLIES) IMPLANT
STOCKINETTE STRL 6IN 960660 (GAUZE/BANDAGES/DRESSINGS) ×2 IMPLANT
STRIP CLOSURE SKIN 1/4X4 (GAUZE/BANDAGES/DRESSINGS) ×2 IMPLANT
SUT ETHILON 4-0 (SUTURE)
SUT ETHILON 4-0 FS2 18XMFL BLK (SUTURE)
SUT ETHILON 5-0 FS-2 18 BLK (SUTURE) IMPLANT
SUT VIC AB 1 CT1 36 (SUTURE) IMPLANT
SUT VIC AB 2-0 CT1 27 (SUTURE)
SUT VIC AB 2-0 CT1 TAPERPNT 27 (SUTURE) IMPLANT
SUT VIC AB 2-0 SH 27 (SUTURE)
SUT VIC AB 2-0 SH 27XBRD (SUTURE) IMPLANT
SUT VIC AB 3-0 SH 27 (SUTURE)
SUT VIC AB 3-0 SH 27X BRD (SUTURE) IMPLANT
SUT VIC AB 4-0 FS2 27 (SUTURE) ×1 IMPLANT
SUT VICRYL AB 3-0 FS1 BRD 27IN (SUTURE) ×1 IMPLANT
SUTURE ETHLN 4-0 FS2 18XMF BLK (SUTURE) IMPLANT
SYRINGE 10CC LL (SYRINGE) IMPLANT

## 2014-11-27 NOTE — Anesthesia Preprocedure Evaluation (Addendum)
Anesthesia Evaluation  Patient identified by MRN, date of birth, ID band Patient awake    Reviewed: Allergy & Precautions, H&P , NPO status , Patient's Chart, lab work & pertinent test results, reviewed documented beta blocker date and time   Airway Mallampati: II  TM Distance: >3 FB Neck ROM: full    Dental  (+) Chipped   Pulmonary former smoker,  breath sounds clear to auscultation  Pulmonary exam normal       Cardiovascular Exercise Tolerance: Good hypertension, On Medications Rhythm:regular Rate:Normal     Neuro/Psych negative neurological ROS  negative psych ROS   GI/Hepatic negative GI ROS, Neg liver ROS,   Endo/Other  negative endocrine ROS  Renal/GU negative Renal ROS  negative genitourinary   Musculoskeletal  (+) Arthritis -, Osteoarthritis,    Abdominal   Peds  Hematology negative hematology ROS (+)   Anesthesia Other Findings   Reproductive/Obstetrics negative OB ROS                           Anesthesia Physical Anesthesia Plan  ASA: II  Anesthesia Plan: Regional and General LMA   Post-op Pain Management:    Induction:   Airway Management Planned:   Additional Equipment:   Intra-op Plan:   Post-operative Plan:   Informed Consent: I have reviewed the patients History and Physical, chart, labs and discussed the procedure including the risks, benefits and alternatives for the proposed anesthesia with the patient or authorized representative who has indicated his/her understanding and acceptance.     Plan Discussed with: CRNA  Anesthesia Plan Comments:        Anesthesia Quick Evaluation

## 2014-11-27 NOTE — Progress Notes (Signed)
Assisted Johny Blamer ANMD with right, popliteal/saphenous block. Side rails up, monitors on throughout procedure. See vital signs in flow sheet. Tolerated Procedure well.

## 2014-11-27 NOTE — Discharge Instructions (Signed)
[facility] °[facility] ° °POST OPERATIVE INSTRUCTIONS FOR DR. Mekayla Soman AND DR. FOWLER °[facility] ° ° °1. Take your medication as prescribed.  Pain medication should be taken only as needed. ° °2. Keep the dressing clean, dry and intact. ° °3. Keep your foot elevated above the heart level for the first 48 hours. ° °4. Walking to the bathroom and brief periods of walking are acceptable, unless we have instructed you to be non-weight bearing. ° °5. Always wear your post-op shoe when walking.  Always use your crutches if you are to be non-weight bearing. ° °6. Do not take a shower. Baths are permissible as long as the foot is kept out of the water.  ° °7. Every hour you are awake:  °- Bend your knee 15 times. °- Flex foot 15 times °- Massage calf 15 times ° °8. Call [facility] ([phone]) if any of the following problems occur: °- You develop a temperature or fever. °- The bandage becomes saturated with blood. °- Medication does not stop your pain. °- Injury of the foot occurs. °- Any symptoms of infection including redness, odor, or red streaks running from wound. °-  ° °General Anesthesia, Care After °Refer to this sheet in the next few weeks. These instructions provide you with information on caring for yourself after your procedure. Your health care provider may also give you more specific instructions. Your treatment has been planned according to current medical practices, but problems sometimes occur. Call your health care provider if you have any problems or questions after your procedure. °WHAT TO EXPECT AFTER THE PROCEDURE °After the procedure, it is typical to experience: °· Sleepiness. °· Nausea and vomiting. °HOME CARE INSTRUCTIONS °· For the first 24 hours after general anesthesia: °¨ Have a responsible person with you. °¨ Do not drive a car. If you are alone, do not take public transportation. °¨ Do not drink alcohol. °¨ Do not take medicine  that has not been prescribed by your health care provider. °¨ Do not sign important papers or make important decisions. °¨ You may resume a normal diet and activities as directed by your health care provider. °· Change bandages (dressings) as directed. °· If you have questions or problems that seem related to general anesthesia, call the hospital and ask for the anesthetist or anesthesiologist on call. °SEEK MEDICAL CARE IF: °· You have nausea and vomiting that continue the day after anesthesia. °· You develop a rash. °SEEK IMMEDIATE MEDICAL CARE IF:  °· You have difficulty breathing. °· You have chest pain. °· You have any allergic problems. °Document Released: 09/12/2000 Document Revised: 06/11/2013 Document Reviewed: 12/20/2012 °ExitCare® Patient Information ©2015 [employer]. This information is not intended to replace advice given to you by your health care provider. Make sure you discuss any questions you have with your health care provider. ° °

## 2014-11-27 NOTE — Op Note (Signed)
Operative note   Surgeon: Dr. Recardo Evangelist, DPM.    Assistant: None    Preop diagnosis: Exostosis with osteophyte formation right talonavicular joint    Postop diagnosis: Same    Procedure:   1. Removal of exostosis and osteophyte from right talonavicular joint      EBL: Negligible    Anesthesia:general with popliteal block    Hemostasis: Ankle tourniquet 2 50 mmHg pressure    Specimen: Hypertrophic bone and osteophyte formation from the dorsal talonavicular joint right foot    Complications: None    Operative indications: Chronic pain unresponsive to conservative care    Procedure:  Patient was brought into the OR and placed on the operating table in thesupine position. After anesthesia was obtained theright lower extremity was prepped and draped in usual sterile fashion.  Operative Report: At this time to his directed to the dorsomedial aspect of the talonavicular joint just medial to the anterior tibial tendon. A 5 cemented dorsolinear skin incision was made over this region deepened sharp dissection bleeders clamped and bovied as required. Vital structures were retracted medially and laterally. The deep fascia overlying the Tissue was identified and incised longitudinally and reflected mediolaterally with the anterior tibial tendon being retracted laterally. This point capsular shift was identified and incised down to bone across the talonavicular joint. This freed were then navicular joint a large bony prominence was noted across the dorsum of the TMJ. Combination of a sagittal saw with the TPS system along with an osteotome were utilized to remove this large prominence. The initial portion was removed and loose portion of bone was noted in the region 2 this is consistent with a likely old fracture that never healed across the region. Abnormal portions and prominences bone removed from the area and rasped smoothly. There was an capsulae irrigated and checked FluoroScan. Small  fragments of bone were noted and removed. After copious irrigation and recheck with FluoroScan the patient was then irrigated and the deep capsular tissue was closed with 3-0 Vicryl in a continuous stitch. Deep superficial fascial layers were closed with 4 Vicryl in a continuous stitch. Skin was then closed with 4 Vicryl subcuticular fashion. This time sterile compressive dressing was placed across wound consisting of Steri-Strips Xeroform gauze 4 x 4's and Kling. The posterior splint is placed on the foot and leg in the operating room. Tourniquet was released and prompt complete vascularity seen to return all digits of the right foot.    Patient tolerated the procedure and anesthesia well.  Was transported from the OR to the PACU with all vital signs stable and vascular status intact. To be discharged per routine protocol.  Will follow up in approximately 1 week in the outpatient clinic.

## 2014-11-27 NOTE — Transfer of Care (Signed)
Immediate Anesthesia Transfer of Care Note  Patient: Gabriel Moon  Procedure(s) Performed: Procedure(s): PARTIAL EXCISION OF TALUS (Right)  Patient Location: PACU  Anesthesia Type: Regional, General LMA  Level of Consciousness: awake, alert  and patient cooperative  Airway and Oxygen Therapy: Patient Spontanous Breathing and Patient connected to supplemental oxygen  Post-op Assessment: Post-op Vital signs reviewed, Patient's Cardiovascular Status Stable, Respiratory Function Stable, Patent Airway and No signs of Nausea or vomiting  Post-op Vital Signs: Reviewed and stable  Complications: No apparent anesthesia complications  

## 2014-11-27 NOTE — Anesthesia Postprocedure Evaluation (Signed)
  Anesthesia Post-op Note  Patient: Gabriel Moon  Procedure(s) Performed: Procedure(s): PARTIAL EXCISION OF TALUS (Right)  Anesthesia type:Regional, General LMA  Patient location: PACU  Post pain: Pain level controlled  Post assessment: Post-op Vital signs reviewed, Patient's Cardiovascular Status Stable, Respiratory Function Stable, Patent Airway and No signs of Nausea or vomiting  Post vital signs: Reviewed and stable  Last Vitals:  Filed Vitals:   11/27/14 1122  BP: 124/92  Pulse: 75  Temp: 36.4 C  Resp: 15    Level of consciousness: awake, alert  and patient cooperative  Complications: No apparent anesthesia complications

## 2014-11-27 NOTE — Transfer of Care (Signed)
Immediate Anesthesia Transfer of Care Note  Patient: Gabriel Moon  Procedure(s) Performed: Procedure(s): PARTIAL EXCISION OF TALUS (Right)  Patient Location: PACU  Anesthesia Type: Regional, General LMA  Level of Consciousness: awake, alert  and patient cooperative  Airway and Oxygen Therapy: Patient Spontanous Breathing and Patient connected to supplemental oxygen  Post-op Assessment: Post-op Vital signs reviewed, Patient's Cardiovascular Status Stable, Respiratory Function Stable, Patent Airway and No signs of Nausea or vomiting  Post-op Vital Signs: Reviewed and stable  Complications: No apparent anesthesia complications

## 2014-11-27 NOTE — Anesthesia Procedure Notes (Addendum)
Anesthesia Regional Block:  Popliteal block  Pre-Anesthetic Checklist: ,, timeout performed, Correct Patient, Correct Site, Correct Laterality, Correct Procedure, Correct Position, site marked, Risks and benefits discussed,  Surgical consent,  Pre-op evaluation,  At surgeon's request and post-op pain management  Laterality: Right  Prep: chloraprep       Needles:  Injection technique: Single-shot  Needle Type: Stimiplex     Needle Length: 4cm 4 cm Needle Gauge: 21 and 21 G    Additional Needles:  Procedures: ultrasound guided (picture in chart) Popliteal block Narrative:  Start time: 11/27/2014 9:15 AM End time: 11/27/2014 9:22 AM Injection made incrementally with aspirations every 25 mL.  Performed by: Personally  Anesthesiologist: Johny Blamer D   Anesthesia Regional Block:  Adductor canal block  Pre-Anesthetic Checklist: ,, timeout performed, Correct Patient, Correct Site, Correct Laterality, Correct Procedure, Correct Position, site marked, Risks and benefits discussed,  Surgical consent,  Pre-op evaluation,  At surgeon's request and post-op pain management  Laterality: Right  Prep: chloraprep       Needles:  Injection technique: Single-shot  Needle Type: Stimiplex     Needle Length: 4cm 4 cm Needle Gauge: 21 and 21 G    Additional Needles:  Procedures: ultrasound guided (picture in chart) Adductor canal block Narrative:  Start time: 11/27/2014 9:15 AM End time: 11/27/2014 9:22 AM Injection made incrementally with aspirations every 15 mL.  Performed by: Personally  Anesthesiologist: Johny Blamer D

## 2014-11-28 ENCOUNTER — Encounter: Payer: Self-pay | Admitting: Podiatry

## 2014-12-01 LAB — SURGICAL PATHOLOGY

## 2015-01-08 ENCOUNTER — Ambulatory Visit (INDEPENDENT_AMBULATORY_CARE_PROVIDER_SITE_OTHER): Payer: BLUE CROSS/BLUE SHIELD | Admitting: Internal Medicine

## 2015-01-08 ENCOUNTER — Encounter: Payer: Self-pay | Admitting: Internal Medicine

## 2015-01-08 VITALS — BP 142/90 | HR 90 | Temp 98.0°F | Ht 69.0 in | Wt 216.0 lb

## 2015-01-08 DIAGNOSIS — E669 Obesity, unspecified: Secondary | ICD-10-CM | POA: Diagnosis not present

## 2015-01-08 DIAGNOSIS — I10 Essential (primary) hypertension: Secondary | ICD-10-CM | POA: Diagnosis not present

## 2015-01-08 DIAGNOSIS — F411 Generalized anxiety disorder: Secondary | ICD-10-CM | POA: Diagnosis not present

## 2015-01-08 MED ORDER — AMLODIPINE BESYLATE 5 MG PO TABS: 5.0000 mg | ORAL_TABLET | Freq: Every day | ORAL | Status: DC

## 2015-01-08 NOTE — Assessment & Plan Note (Signed)
New onset, possibly related to recent wt gain but not clear, no s/s osa, for start amlodipine 5 qd,  to f/u any worsening symptoms or concerns, f/u BP at home and next visit BP Readings from Last 3 Encounters:  01/08/15 142/90  11/27/14 124/92  09/17/14 134/88

## 2015-01-08 NOTE — Assessment & Plan Note (Signed)
D/w pt - goal BMI 25 or less, for less calories, increased activity now that he has essentially healed from recent right foot sugury

## 2015-01-08 NOTE — Assessment & Plan Note (Signed)
stable overall by history and exam, recent data reviewed with pt, and pt to continue medical treatment as before,  to f/u any worsening symptoms or concerns Lab Results  Component Value Date   WBC 6.9 09/17/2014   HGB 17.1* 09/17/2014   HCT 49.4 09/17/2014   PLT 248.0 09/17/2014   GLUCOSE 113* 09/17/2014   CHOL 174 09/17/2014   TRIG 152.0* 09/17/2014   HDL 30.90* 09/17/2014   LDLDIRECT 116.1 10/24/2012   LDLCALC 113* 09/17/2014   ALT 25 09/17/2014   AST 13 09/17/2014   NA 139 09/17/2014   K 4.3 09/17/2014   CL 104 09/17/2014   CREATININE 1.26 09/17/2014   BUN 16 09/17/2014   CO2 32 09/17/2014   TSH 2.07 09/17/2014   PSA 0.72 11/15/2013   HGBA1C 5.5 10/25/2012

## 2015-01-08 NOTE — Progress Notes (Signed)
Pre visit review using our clinic review tool, if applicable. No additional management support is needed unless otherwise documented below in the visit note. 

## 2015-01-08 NOTE — Patient Instructions (Addendum)
Please take all new medication as prescribed - the amlodipine 5 mg per day  Please continue all other medications as before, and refills have been done if requested.  Please have the pharmacy call with any other refills you may need.  Please continue your efforts at being more active, low cholesterol diet, and weight control.  Please keep your appointments with your specialists as you may have planned  Please return in 3 months, or sooner if needed

## 2015-01-08 NOTE — Progress Notes (Signed)
Subjective:    Patient ID: Gabriel Moon, male    DOB: 1971-01-25, 44 y.o.   MRN: [id number]  HPI  Here to f/u; overall doing ok,  Pt denies chest pain, increasing sob or doe, wheezing, orthopnea, PND, increased LE swelling, palpitations, dizziness or syncope.  Pt denies new neurological symptoms such as new headache, or facial or extremity weakness or numbness.  Pt denies polydipsia, polyuria, or low sugar episode.   Pt denies new neurological symptoms such as new headache, or facial or extremity weakness or numbness.   Pt states overall good compliance with meds, mostly trying to follow appropriate diet, with wt overall stable,  but little exercise however. BP has been elevated over the past mo, since had surgury 6 wks ago, more stressors, has gained some wt recent, but overall lost down from 250 Wt Readings from Last 3 Encounters:  01/08/15 216 lb (97.977 kg)  11/27/14 201 lb (91.173 kg)  09/17/14 230 lb (104.327 kg)  Wife left him, took everything including money and possessions, almost lost the house;  Denies worsening depressive symptoms, suicidal ideation, or panic, infact feels better now that she is gone. Does c/o ongoing fatigue, but denies signficant daytime hypersomnolence. / Past Medical History  Diagnosis Date  . Chronic LBP 04/19/2011  . HYPERLIPIDEMIA 01/13/2007  . ANXIETY 01/13/2007  . NEPHROLITHIASIS, HX OF 04/26/2007  . Rhinitis, allergic   . Headache   . Testosterone deficiency     MALE HYPOGONADISM  . Arthritis     OSTEO, MIDFOOT  . Hypertension     LOST WEIGHT, NOT ON MEDS    Past Surgical History  Procedure Laterality Date  . Bone exostosis excision Right 11/27/2014    Procedure: PARTIAL EXCISION OF TALUS;  Surgeon: Recardo Evangelist, DPM;  Location: [facility];  Service: Podiatry;  Laterality: Right;    reports that he quit smoking about 12 years ago. His smoking use included Cigarettes. He has a 15 pack-year smoking history. He has never used smokeless  tobacco. He reports that he drinks about 1.2 oz of alcohol per week. He reports that he does not use illicit drugs. family history is not on file. Allergies  Allergen Reactions  . Lexapro [Escitalopram Oxalate]     BAD REACTION-SUICIDAL   Current Outpatient Prescriptions on File Prior to Visit  Medication Sig Dispense Refill  . ALPRAZolam (XANAX) 0.5 MG tablet Take 1 tablet (0.5 mg total) by mouth 3 (three) times daily. 90 tablet 2  . cetirizine (ZYRTEC) 10 MG tablet Take 1 tablet (10 mg total) by mouth daily. 90 tablet 3  . fluticasone (FLONASE) 50 MCG/ACT nasal spray PLACE 2 SPRAYS INTO THE NOSE DAILY. 16 g 1  . clomiPHENE (CLOMID) 50 MG tablet Take 50 mg by mouth daily.    Marland Kitchen oxyCODONE-acetaminophen (PERCOCET) 7.5-325 MG per tablet Take 1 tablet by mouth every 4 (four) hours as needed for severe pain. (Patient not taking: Reported on 01/08/2015) 30 tablet 0   No current facility-administered medications on file prior to visit.   Review of Systems  Constitutional: Negative for unusual diaphoresis or night sweats HENT: Negative for ringing in ear or discharge Eyes: Negative for double vision or worsening visual disturbance.  Respiratory: Negative for choking and stridor.   Gastrointestinal: Negative for vomiting or other signifcant bowel change Genitourinary: Negative for hematuria or change in urine volume.  Musculoskeletal: Negative for other MSK pain or swelling Skin: Negative for color change and worsening wound.  Neurological: Negative for tremors  and numbness other than noted  Psychiatric/Behavioral: Negative for decreased concentration or agitation other than above       Objective:   Physical Exam BP 142/90 mmHg  Pulse 90  Temp(Src) 98 F (36.7 C) (Oral)  Ht  (1.753 m)  Wt 216 lb (97.977 kg)  BMI 31.88 kg/m2  SpO2 97% VS noted,  Constitutional: Pt appears in no significant distress HENT: Head: NCAT.  Right Ear: External ear normal.  Left Ear: External ear  normal.  Eyes: . Pupils are equal, round, and reactive to light. Conjunctivae and EOM are normal Neck: Normal range of motion. Neck supple.  Cardiovascular: Normal rate and regular rhythm.   Pulmonary/Chest: Effort normal and breath sounds without rales or wheezing.  Abd:  Soft, NT, ND, + BS Neurological: Pt is alert. Not confused , motor grossly intact Skin: Skin is warm. No rash, no LE edema Psychiatric: Pt behavior is normal. No agitation.     Assessment & Plan:

## 2015-02-05 ENCOUNTER — Other Ambulatory Visit: Payer: Self-pay | Admitting: Internal Medicine

## 2015-02-06 NOTE — Telephone Encounter (Signed)
Done hardcopy to Dahlia  

## 2015-02-06 NOTE — Telephone Encounter (Signed)
Rx faxed to pharmacy  

## 2015-05-07 ENCOUNTER — Other Ambulatory Visit: Payer: Self-pay | Admitting: Internal Medicine

## 2015-05-08 ENCOUNTER — Other Ambulatory Visit: Payer: Self-pay | Admitting: Internal Medicine

## 2015-05-08 NOTE — Telephone Encounter (Signed)
Done hardcopy to Dahlia  

## 2015-05-08 NOTE — Telephone Encounter (Signed)
Rx faxed to pharmacy  

## 2015-07-06 ENCOUNTER — Telehealth: Payer: Self-pay

## 2015-07-06 ENCOUNTER — Encounter: Payer: Self-pay | Admitting: Internal Medicine

## 2015-07-06 MED ORDER — ALPRAZOLAM 0.5 MG PO TABS: 0.5000 mg | ORAL_TABLET | Freq: Three times a day (TID) | ORAL | Status: DC

## 2015-07-06 NOTE — Telephone Encounter (Signed)
[facility] [place] [facility] due to insurance changes

## 2015-07-06 NOTE — Addendum Note (Signed)
Addended by: Corwin LevinsJOHN, Alyia Lacerte W on: 07/06/2015 01:02 PM   Modules accepted: Orders

## 2015-07-06 NOTE — Telephone Encounter (Signed)
Done hardcopy to heather 

## 2015-07-30 ENCOUNTER — Other Ambulatory Visit: Payer: Self-pay | Admitting: Family Medicine

## 2015-07-30 ENCOUNTER — Ambulatory Visit
Admission: RE | Admit: 2015-07-30 | Discharge: 2015-07-30 | Disposition: A | Discharge status: Home / Self Care | Payer: BLUE CROSS/BLUE SHIELD | Source: Ambulatory Visit | Attending: Family Medicine | Admitting: Family Medicine

## 2015-07-30 DIAGNOSIS — R600 Localized edema: Secondary | ICD-10-CM

## 2015-08-07 ENCOUNTER — Telehealth: Payer: Self-pay

## 2015-08-07 NOTE — Telephone Encounter (Signed)
Please advise, patient is requesting refill on xanax 

## 2015-08-07 NOTE — Telephone Encounter (Signed)
This would be too soon for xanax , as last rx for 3 mo was done Jul 06, 2015

## 2015-08-13 ENCOUNTER — Encounter: Payer: Self-pay | Admitting: Internal Medicine

## 2015-08-18 NOTE — Telephone Encounter (Signed)
Forwarding msg to Gabriel Moon...Gabriel Moon

## 2015-08-18 NOTE — Telephone Encounter (Signed)
Called and left patient a message, Dr. Jonny Ruiz replied to e-mail, it is too early for patient to get a prescription refill. Last refill was for 3 months and it was done on July 06 2015

## 2015-08-18 NOTE — Telephone Encounter (Signed)
Pt called back and he states he didn't get the prescription that was written on 1/16.  He uses [facility] on [address]. In [place].  He said [facility] and they said they [facility]'t [facility].   He also sent an email to Dr. Jonny Ruiz last week that never got answered.  Can you please check on this.

## 2015-08-20 ENCOUNTER — Encounter: Payer: Self-pay | Admitting: Internal Medicine

## 2015-08-27 ENCOUNTER — Telehealth: Payer: Self-pay | Admitting: Internal Medicine

## 2015-08-27 NOTE — Telephone Encounter (Signed)
Forwarding msg to Corrine. Patient has called several times & sent emails per Corrine rx was fgaxred to pharmacy. Corrine you need to call his pharmacy & verify if they received your script & call this patient back and let him know status!!!.../lmb

## 2015-08-27 NOTE — Telephone Encounter (Signed)
Patient is calling in concern about alprazolam.  States our system shows [facility] [place]  [facility] [place].  Patient has called both pharmacies and both states they did not receive a script for patient back in January.  Patient is needing refill and is requesting script to [facility] [place].

## 2015-08-28 NOTE — Telephone Encounter (Signed)
[facility] [place], patient is aware of this.

## 2015-09-22 ENCOUNTER — Other Ambulatory Visit (INDEPENDENT_AMBULATORY_CARE_PROVIDER_SITE_OTHER): Payer: BLUE CROSS/BLUE SHIELD

## 2015-09-22 ENCOUNTER — Ambulatory Visit (INDEPENDENT_AMBULATORY_CARE_PROVIDER_SITE_OTHER): Payer: BLUE CROSS/BLUE SHIELD | Admitting: Internal Medicine

## 2015-09-22 ENCOUNTER — Encounter: Payer: Self-pay | Admitting: Internal Medicine

## 2015-09-22 ENCOUNTER — Telehealth: Payer: Self-pay

## 2015-09-22 VITALS — BP 124/82 | HR 86 | Temp 98.5°F | Resp 20 | Wt 231.0 lb

## 2015-09-22 DIAGNOSIS — F411 Generalized anxiety disorder: Secondary | ICD-10-CM

## 2015-09-22 DIAGNOSIS — Z0000 Encounter for general adult medical examination without abnormal findings: Secondary | ICD-10-CM | POA: Diagnosis not present

## 2015-09-22 DIAGNOSIS — E291 Testicular hypofunction: Secondary | ICD-10-CM

## 2015-09-22 DIAGNOSIS — I10 Essential (primary) hypertension: Secondary | ICD-10-CM

## 2015-09-22 DIAGNOSIS — E785 Hyperlipidemia, unspecified: Secondary | ICD-10-CM

## 2015-09-22 LAB — CBC WITH DIFFERENTIAL/PLATELET
Basophils Absolute: 0 10*3/uL (ref 0.0–0.1)
Basophils Relative: 0.5 % (ref 0.0–3.0)
EOS PCT: 3.6 % (ref 0.0–5.0)
Eosinophils Absolute: 0.3 10*3/uL (ref 0.0–0.7)
HCT: 47.3 % (ref 39.0–52.0)
Hemoglobin: 16 g/dL (ref 13.0–17.0)
LYMPHS ABS: 1.8 10*3/uL (ref 0.7–4.0)
Lymphocytes Relative: 23 % (ref 12.0–46.0)
MCHC: 33.7 g/dL (ref 30.0–36.0)
MCV: 87.5 fl (ref 78.0–100.0)
MONO ABS: 0.6 10*3/uL (ref 0.1–1.0)
Monocytes Relative: 7.4 % (ref 3.0–12.0)
NEUTROS PCT: 65.5 % (ref 43.0–77.0)
Neutro Abs: 5.2 10*3/uL (ref 1.4–7.7)
Platelets: 370 10*3/uL (ref 150.0–400.0)
RBC: 5.41 Mil/uL (ref 4.22–5.81)
RDW: 13 % (ref 11.5–15.5)
WBC: 8 10*3/uL (ref 4.0–10.5)

## 2015-09-22 LAB — HEPATIC FUNCTION PANEL
ALT: 35 U/L (ref 0–53)
AST: 15 U/L (ref 0–37)
Albumin: 4.2 g/dL (ref 3.5–5.2)
Alkaline Phosphatase: 77 U/L (ref 39–117)
Bilirubin, Direct: 0.1 mg/dL (ref 0.0–0.3)
Total Bilirubin: 0.3 mg/dL (ref 0.2–1.2)
Total Protein: 6.6 g/dL (ref 6.0–8.3)

## 2015-09-22 LAB — URINALYSIS, ROUTINE W REFLEX MICROSCOPIC
BILIRUBIN URINE: NEGATIVE
Hgb urine dipstick: NEGATIVE
Ketones, ur: NEGATIVE
Leukocytes, UA: NEGATIVE
Nitrite: NEGATIVE
RBC / HPF: NONE SEEN (ref 0–?)
Specific Gravity, Urine: <=1.005 — AB (ref 1.000–1.030)
Total Protein, Urine: NEGATIVE
Urine Glucose: NEGATIVE
Urobilinogen, UA: 0.2 (ref 0.0–1.0)
pH: 6.5 (ref 5.0–8.0)

## 2015-09-22 LAB — BASIC METABOLIC PANEL
BUN: 12 mg/dL (ref 6–23)
CALCIUM: 9.5 mg/dL (ref 8.4–10.5)
CO2: 28 mEq/L (ref 19–32)
Chloride: 102 mEq/L (ref 96–112)
Creatinine, Ser: 0.94 mg/dL (ref 0.40–1.50)
GFR: 92.75 mL/min (ref >60.00)
Glucose, Bld: 101 mg/dL — ABNORMAL HIGH (ref 70–99)
Potassium: 4.2 mEq/L (ref 3.5–5.1)
Sodium: 138 mEq/L (ref 135–145)

## 2015-09-22 LAB — HEMOGLOBIN A1C: Hgb A1c MFr Bld: 5.4 % (ref 4.6–6.5)

## 2015-09-22 LAB — PSA: PSA: 0.65 ng/mL (ref 0.10–4.00)

## 2015-09-22 LAB — TSH: TSH: 1.77 u[IU]/mL (ref 0.35–4.50)

## 2015-09-22 MED ORDER — TESTOSTERONE CYPIONATE 200 MG/ML IM SOLN: 50.0000 mg | INTRAMUSCULAR | Status: DC

## 2015-09-22 NOTE — Progress Notes (Signed)
Subjective:    Patient ID: Gabriel Moon, male    DOB: 1970/09/18, 45 y.o.   MRN: [id number]  HPI  Here for wellness and f/u;  Overall doing ok;  Pt denies Chest pain, worsening SOB, DOE, wheezing, orthopnea, PND, worsening LE edema, palpitations, dizziness or syncope.  Pt denies neurological change such as new headache, facial or extremity weakness.  Pt denies polydipsia, polyuria, or low sugar symptoms. Pt states overall good compliance with treatment and medications, good tolerability, and has been trying to follow appropriate diet.  Pt denies worsening depressive symptoms, suicidal ideation or panic. No fever, night sweats, wt loss, loss of appetite, or other constitutional symptoms.  Pt states good ability with ADL's, has low fall risk, home safety reviewed and adequate, no other significant changes in hearing or vision, and only occasionally active with exercise, except does wt lifting for wt loss, but tends to always regain. Wt Readings from Last 3 Encounters:  09/22/15 231 lb (104.781 kg)  01/08/15 216 lb (97.977 kg)  11/27/14 201 lb (91.173 kg)  Divorce final soon, not too upset about this. Declines tetanus. [facility] is tx for low testost with replacement,  Denies hyper or hypo thyroid symptoms such as voice, skin or hair change.  Pt denies fever, wt loss, night sweats, loss of appetite, or other constitutional symptoms  Denies worsening reflux, abd pain, dysphagia, n/v, bowel change or blood. Past Medical History  Diagnosis Date  . Chronic LBP 04/19/2011  . HYPERLIPIDEMIA 01/13/2007  . ANXIETY 01/13/2007  . NEPHROLITHIASIS, HX OF 04/26/2007  . Rhinitis, allergic   . Headache   . Testosterone deficiency     MALE HYPOGONADISM  . Arthritis     OSTEO, MIDFOOT  . Hypertension     LOST WEIGHT, NOT ON MEDS    Past Surgical History  Procedure Laterality Date  . Bone exostosis excision Right 11/27/2014    Procedure: PARTIAL EXCISION OF TALUS;  Surgeon: Recardo Evangelist, DPM;  [facility]  [facility];  Service: Podiatry;  Laterality: Right;    reports that he quit smoking about 13 years ago. His smoking use included Cigarettes. He has a 15 pack-year smoking history. He has never used smokeless tobacco. He reports that he drinks about 1.2 oz of alcohol per week. He reports that he does not use illicit drugs. family history is not on file. Allergies  Allergen Reactions  . Lexapro [Escitalopram Oxalate]     BAD REACTION-SUICIDAL   Current Outpatient Prescriptions on File Prior to Visit  Medication Sig Dispense Refill  . ALPRAZolam (XANAX) 0.5 MG tablet Take 1 tablet (0.5 mg total) by mouth 3 (three) times daily. 90 tablet 2  . amLODipine (NORVASC) 5 MG tablet Take 1 tablet (5 mg total) by mouth daily. 90 tablet 3  . cetirizine (ZYRTEC) 10 MG tablet Take 1 tablet (10 mg total) by mouth daily. 90 tablet 3  . fluticasone (FLONASE) 50 MCG/ACT nasal spray PLACE 2 SPRAYS INTO THE NOSE DAILY. 16 g 3   No current facility-administered medications on file prior to visit.    Review of Systems Constitutional: Negative for increased diaphoresis, or other activity, appetite or siginficant weight change other than noted HENT: Negative for worsening hearing loss, ear pain, facial swelling, mouth sores and neck stiffness.   Eyes: Negative for other worsening pain, redness or visual disturbance.  Respiratory: Negative for choking or stridor Cardiovascular: Negative for other chest pain and palpitations.  Gastrointestinal: Negative for worsening diarrhea, blood in stool, or  abdominal distention Genitourinary: Negative for hematuria, flank pain or change in urine volume.  Musculoskeletal: Negative for myalgias or other joint complaints.  Skin: Negative for other color change and wound or drainage.  Neurological: Negative for syncope and numbness. other than noted Hematological: Negative for adenopathy. or other swelling Psychiatric/Behavioral: Negative for hallucinations, SI,  self-injury, decreased concentration or other worsening agitation.      Objective:   Physical Exam BP 124/82 mmHg  Pulse 86  Temp(Src) 98.5 F (36.9 C) (Oral)  Resp 20  Wt 231 lb (104.781 kg)  SpO2 96% VS noted,  Constitutional: Pt is oriented to person, place, and time. Appears well-developed and well-nourished, in no significant distress Head: Normocephalic and atraumatic  Eyes: Conjunctivae and EOM are normal. Pupils are equal, round, and reactive to light Right Ear: External ear normal.  Left Ear: External ear normal Nose: Nose normal.  Mouth/Throat: Oropharynx is clear and moist  Neck: Normal range of motion. Neck supple. No JVD present. No tracheal deviation present or significant neck LA or mass Cardiovascular: Normal rate, regular rhythm, normal heart sounds and intact distal pulses.   Pulmonary/Chest: Effort normal and breath sounds without rales or wheezing  Abdominal: Soft. Bowel sounds are normal. NT. No HSM  Musculoskeletal: Normal range of motion. Exhibits no edema Lymphadenopathy: Has no cervical adenopathy.  Neurological: Pt is alert and oriented to person, place, and time. Pt has normal reflexes. No cranial nerve deficit. Motor grossly intact Skin: Skin is warm and dry. No rash noted or new ulcers Psychiatric:  Has normal mood and affect. Behavior is normal.    Assessment & Plan:

## 2015-09-22 NOTE — Progress Notes (Signed)
Pre visit review using our clinic review tool, if applicable. No additional management support is needed unless otherwise documented below in the visit note. 

## 2015-09-22 NOTE — Assessment & Plan Note (Signed)

## 2015-09-22 NOTE — Patient Instructions (Signed)
Please continue all other medications as before, and refills have been done if requested.  Please have the pharmacy call with any other refills you may need.  Please continue your efforts at being more active, low cholesterol diet, and weight control.  You are otherwise up to date with prevention measures today.  Please keep your appointments with your specialists as you may have planned  Your lab work was done today  You will be contacted by phone if any changes need to be made immediately.  Otherwise, you will receive a letter about your results with an explanation, but please check with MyChart first.  Please remember to sign up for MyChart if you have not done so, as this will be important to you in the future with finding out test results, communicating by private email, and scheduling acute appointments online when needed.  Please return in 1 year for your yearly visit, or sooner if needed, with Lab testing done 3-5 days before  

## 2015-09-22 NOTE — Telephone Encounter (Signed)
Orders placed for lab work. 

## 2015-09-25 NOTE — Assessment & Plan Note (Signed)
Asympt, for testosterone check, f/u with provider as needed for med refills and tx

## 2015-09-25 NOTE — Assessment & Plan Note (Addendum)
stable overall by history and exam, recent data reviewed with pt, and pt to continue medical treatment as before,  to f/u any worsening symptoms or concerns BP Readings from Last 3 Encounters:  09/22/15 124/82  01/08/15 142/90  11/27/14 124/92  In addition to the time spent performing CPE, I spent an additional 15 minutes face to face,in which greater than 50% of this time was spent in counseling and coordination of care for patient's acute illness as documented.

## 2015-09-25 NOTE — Assessment & Plan Note (Signed)
stable overall by history and exam, recent data reviewed with pt, and pt to continue medical treatment as before,  to f/u any worsening symptoms or concerns Lab Results  Component Value Date   LDLCALC 113* 09/17/2014   For lower chol diet, f/u labs

## 2015-09-25 NOTE — Assessment & Plan Note (Signed)
stable overall by history and exam, recent data reviewed with pt, and pt to continue medical treatment as before,  to f/u any worsening symptoms or concerns Lab Results  Component Value Date   WBC 8.0 09/22/2015   HGB 16.0 09/22/2015   HCT 47.3 09/22/2015   PLT 370.0 09/22/2015   GLUCOSE 101* 09/22/2015   CHOL 174 09/17/2014   TRIG 152.0* 09/17/2014   HDL 30.90* 09/17/2014   LDLDIRECT 116.1 10/24/2012   LDLCALC 113* 09/17/2014   ALT 35 09/22/2015   AST 15 09/22/2015   NA 138 09/22/2015   K 4.2 09/22/2015   CL 102 09/22/2015   CREATININE 0.94 09/22/2015   BUN 12 09/22/2015   CO2 28 09/22/2015   TSH 1.77 09/22/2015   PSA 0.65 09/22/2015   HGBA1C 5.4 09/22/2015

## 2015-12-16 ENCOUNTER — Other Ambulatory Visit: Payer: Self-pay | Admitting: Family Medicine

## 2015-12-16 ENCOUNTER — Ambulatory Visit
Admission: RE | Admit: 2015-12-16 | Discharge: 2015-12-16 | Disposition: A | Discharge status: Home / Self Care | Payer: BLUE CROSS/BLUE SHIELD | Source: Ambulatory Visit | Attending: Family Medicine | Admitting: Family Medicine

## 2015-12-16 DIAGNOSIS — M25571 Pain in right ankle and joints of right foot: Secondary | ICD-10-CM

## 2015-12-23 ENCOUNTER — Telehealth: Payer: Self-pay

## 2015-12-23 MED ORDER — ALPRAZOLAM 0.5 MG PO TABS: 0.5000 mg | ORAL_TABLET | Freq: Three times a day (TID) | ORAL | Status: DC

## 2015-12-23 NOTE — Telephone Encounter (Signed)
Done hardcopy to Corinne  

## 2015-12-23 NOTE — Addendum Note (Signed)
Addended by: Corwin LevinsJOHN, Gary Bultman W on: 12/23/2015 12:29 PM   Modules accepted: Orders

## 2015-12-23 NOTE — Telephone Encounter (Signed)
Medication refill sent to pharmacy  

## 2015-12-23 NOTE — Telephone Encounter (Signed)
Please advise patient is requesting refill on xanax 

## 2015-12-30 ENCOUNTER — Telehealth: Payer: Self-pay

## 2015-12-30 MED ORDER — AMLODIPINE BESYLATE 5 MG PO TABS: 5.0000 mg | ORAL_TABLET | Freq: Every day | ORAL | Status: DC

## 2015-12-30 NOTE — Telephone Encounter (Signed)
Medication refill sent to pharmacy  

## 2016-02-03 ENCOUNTER — Emergency Department: Payer: Worker's Compensation

## 2016-02-03 ENCOUNTER — Emergency Department
Admission: EM | Admit: 2016-02-03 | Discharge: 2016-02-03 | Disposition: A | Discharge status: Home / Self Care | Payer: Worker's Compensation | Attending: Student | Admitting: Student

## 2016-02-03 DIAGNOSIS — Z23 Encounter for immunization: Secondary | ICD-10-CM | POA: Insufficient documentation

## 2016-02-03 DIAGNOSIS — Y9289 Other specified places as the place of occurrence of the external cause: Secondary | ICD-10-CM | POA: Insufficient documentation

## 2016-02-03 DIAGNOSIS — S61217A Laceration without foreign body of left little finger without damage to nail, initial encounter: Secondary | ICD-10-CM

## 2016-02-03 DIAGNOSIS — Y990 Civilian activity done for income or pay: Secondary | ICD-10-CM | POA: Diagnosis not present

## 2016-02-03 DIAGNOSIS — S62607B Fracture of unspecified phalanx of left little finger, initial encounter for open fracture: Secondary | ICD-10-CM

## 2016-02-03 DIAGNOSIS — Z79899 Other long term (current) drug therapy: Secondary | ICD-10-CM | POA: Insufficient documentation

## 2016-02-03 DIAGNOSIS — S62667A Nondisplaced fracture of distal phalanx of left little finger, initial encounter for closed fracture: Secondary | ICD-10-CM | POA: Insufficient documentation

## 2016-02-03 DIAGNOSIS — I10 Essential (primary) hypertension: Secondary | ICD-10-CM | POA: Diagnosis not present

## 2016-02-03 DIAGNOSIS — Y939 Activity, unspecified: Secondary | ICD-10-CM | POA: Diagnosis not present

## 2016-02-03 DIAGNOSIS — Z87891 Personal history of nicotine dependence: Secondary | ICD-10-CM | POA: Diagnosis not present

## 2016-02-03 DIAGNOSIS — S6992XA Unspecified injury of left wrist, hand and finger(s), initial encounter: Secondary | ICD-10-CM | POA: Diagnosis present

## 2016-02-03 DIAGNOSIS — W270XXA Contact with workbench tool, initial encounter: Secondary | ICD-10-CM | POA: Insufficient documentation

## 2016-02-03 MED ORDER — LIDOCAINE HCL (PF) 1 % IJ SOLN
10.0000 mL | Freq: Once | INTRAMUSCULAR | Status: AC
  Administered 2016-02-03: 10 mL
  Filled 2016-02-03: qty 10

## 2016-02-03 MED ORDER — OXYCODONE-ACETAMINOPHEN 5-325 MG PO TABS
1.0000 | ORAL_TABLET | Freq: Once | ORAL | Status: AC
  Administered 2016-02-03: 1 via ORAL
  Filled 2016-02-03: qty 1

## 2016-02-03 MED ORDER — TETANUS-DIPHTH-ACELL PERTUSSIS 5-2.5-18.5 LF-MCG/0.5 IM SUSP
0.5000 mL | Freq: Once | INTRAMUSCULAR | Status: AC
  Administered 2016-02-03: 0.5 mL via INTRAMUSCULAR
  Filled 2016-02-03: qty 0.5

## 2016-02-03 MED ORDER — HYDROCODONE-ACETAMINOPHEN 5-325 MG PO TABS: 1.0000 | ORAL_TABLET | ORAL | 0 refills | Status: DC | PRN

## 2016-02-03 MED ORDER — CEPHALEXIN 500 MG PO CAPS
500.0000 mg | ORAL_CAPSULE | Freq: Once | ORAL | Status: AC
  Administered 2016-02-03: 500 mg via ORAL
  Filled 2016-02-03: qty 1

## 2016-02-03 MED ORDER — CEPHALEXIN 500 MG PO CAPS: 500.0000 mg | ORAL_CAPSULE | Freq: Four times a day (QID) | ORAL | 0 refills | Status: DC

## 2016-02-03 NOTE — ED Notes (Signed)
Pt hand placed in cleaning solution.

## 2016-02-03 NOTE — ED Notes (Signed)
Spoke to Claudell KyleSheila Traywick, RN at the [facility] [place] who stated we do not need to do a UDS or any other testing to process patient's workman's compensation forms.

## 2016-02-03 NOTE — ED Triage Notes (Signed)
Pt states he accidentally hit his left 5th/pinkie finger with a hammer this morning while at work for the [employer] of [place]

## 2016-02-03 NOTE — ED Notes (Signed)
States he hit hit left 5 th finger with a hammer at work Injury noted to left 5 th finger at nail

## 2016-02-03 NOTE — Discharge Instructions (Signed)
Take Keflex 500 mg 4 times a day for 7 days. Norco one or 2 tablets every 4 hours if needed for pain. Call and make an appointment with Dr. Rosita KeaMenz office since this is an open fracture of your finger. Keep area clean and dry. Wear splint until seen by the orthopedist. WOUND CARE Please return in 12 days to have your stitches/staples removed or sooner if you have concerns.  Keep area clean and dry for 24 hours. Do not remove bandage, if applied.  After 24 hours, remove bandage and wash wound gently with mild soap and warm water. Reapply a new bandage after cleaning wound, if directed.  Continue daily cleansing with soap and water until stitches/staples are removed.  Do not apply any ointments or creams to the wound while stitches/staples are in place, as this may cause delayed healing.  Notify the office if you experience any of the following signs of infection: Swelling, redness, pus drainage, streaking, fever >101.0 F  Notify the office if you experience excessive bleeding that does not stop after 15-20 minutes of constant, firm pressure.

## 2016-02-03 NOTE — ED Provider Notes (Signed)
[facility] Emergency Department Provider Note  ____________________________________________   First MD Initiated Contact with Patient 02/03/16 1138     (approximate)  I have reviewed the triage vital signs and the nursing notes.   HISTORY  Chief Complaint Hand Pain   HPI Gabriel Moon is a 45 y.o. male is here after hitting his left fifth finger with a hammer this morning while working for the [place]. Patient states he is unsure of when the last time he had a tetanus shot was. He denies any other injuries. Patient did not drive himself to the emergency room. Currently he rates his pain as a 7/10.   Past Medical History:  Diagnosis Date  . ANXIETY 01/13/2007  . Arthritis    OSTEO, MIDFOOT  . Chronic LBP 04/19/2011  . Headache   . HYPERLIPIDEMIA 01/13/2007  . Hypertension    LOST WEIGHT, NOT ON MEDS   . NEPHROLITHIASIS, HX OF 04/26/2007  . Rhinitis, allergic   . Testosterone deficiency    MALE HYPOGONADISM    Patient Active Problem List   Diagnosis Date Noted  . Essential hypertension 01/08/2015  . Obesity 01/08/2015  . Hypogonadism male 11/15/2013  . Hypersomnolence 11/15/2013  . Ganglion cyst 11/15/2013  . Chronic LBP 04/19/2011  . Preventative health care 04/18/2011  . NEPHROLITHIASIS, HX OF 04/26/2007  . Hyperlipidemia 01/13/2007  . Anxiety state 01/13/2007  . ALLERGIC RHINITIS 01/13/2007  . Headache(784.0) 01/13/2007    Past Surgical History:  Procedure Laterality Date  . BONE EXOSTOSIS EXCISION Right 11/27/2014   Procedure: PARTIAL EXCISION OF TALUS;  Surgeon: Recardo EvangelistMatthew Troxler, DPM;  [facility];  Service: Podiatry;  Laterality: Right;    Prior to Admission medications   Medication Sig Start Date End Date Taking? Authorizing Provider  hydrochlorothiazide (HYDRODIURIL) 12.5 MG tablet Take 12.5 mg by mouth daily.   Yes Historical Provider, MD  ALPRAZolam Prudy Feeler(XANAX) 0.5 MG tablet Take 1 tablet (0.5 mg  total) by mouth 3 (three) times daily. 12/23/15   Corwin LevinsJames W John, MD  amLODipine (NORVASC) 5 MG tablet Take 1 tablet (5 mg total) by mouth daily. 12/30/15 12/29/16  Corwin LevinsJames W John, MD  cephALEXin (KEFLEX) 500 MG capsule Take 1 capsule (500 mg total) by mouth 4 (four) times daily. 02/03/16   Tommi Rumpshonda L Ebb Carelock, PA-C  cetirizine (ZYRTEC) 10 MG tablet Take 1 tablet (10 mg total) by mouth daily. 04/19/11   Corwin LevinsJames W John, MD  fluticasone (FLONASE) 50 MCG/ACT nasal spray PLACE 2 SPRAYS INTO THE NOSE DAILY. 05/08/15   Corwin LevinsJames W John, MD  HYDROcodone-acetaminophen (NORCO/VICODIN) 5-325 MG tablet Take 1-2 tablets by mouth every 4 (four) hours as needed for moderate pain. 02/03/16   Tommi Rumpshonda L Kenroy Timberman, PA-C  testosterone cypionate (DEPOTESTOSTERONE CYPIONATE) 200 MG/ML injection Inject 0.25 mLs (50 mg total) into the muscle once a week. 09/22/15   Corwin LevinsJames W John, MD    Allergies Lexapro [escitalopram oxalate]  No family history on file.  Social History Social History  Substance Use Topics  . Smoking status: Former Smoker    Packs/day: 1.00    Years: 15.00    Types: Cigarettes    Quit date: 06/20/2002  . Smokeless tobacco: Never Used  . Alcohol use 1.2 oz/week    2 Cans of beer per week     Comment: 6 OR 7 PER MONTH    Review of Systems Constitutional: No fever/chills Cardiovascular: Denies chest pain. Respiratory: Denies shortness of breath. Gastrointestinal:  No nausea, no vomiting.  Musculoskeletal: Positive for left fifth finger pain. Skin: Positive for laceration Neurological: Negative for headaches, focal weakness or numbness.  10-point ROS otherwise negative.  ____________________________________________   PHYSICAL EXAM:  VITAL SIGNS: ED Triage Vitals  Enc Vitals Group     BP 02/03/16 1125 139/79     Pulse Rate 02/03/16 1125 74     Resp 02/03/16 1125 18     Temp 02/03/16 1125 98.1 F (36.7 C)     Temp Source 02/03/16 1125 Oral     SpO2 02/03/16 1125 98 %     Weight 02/03/16 1127 235 lb  (106.6 kg)     Height 02/03/16 1127 5\' 8"  (1.727 m)     Head Circumference --      Peak Flow --      Pain Score 02/03/16 1127 7     Pain Loc --      Pain Edu? --      Excl. in GC? --     Constitutional: Alert and oriented. Well appearing and in no acute distress. Eyes: Conjunctivae are normal. PERRL. EOMI. Head: Atraumatic. Nose: No congestion/rhinnorhea. Neck: No stridor.   Cardiovascular: Normal rate, regular rhythm. Grossly normal heart sounds.  Good peripheral circulation. Respiratory: Normal respiratory effort.  No retractions. Lungs CTAB. Musculoskeletal: On examination of the left fifth finger there is moderate tenderness along with laceration at the distal portion of the digit. Range of motion is restricted secondary to pain however patient is able to flex and extend the distal phalanx which is guarded. There is no active bleeding at this time. Motor sensory function intact. Neurologic:  Normal speech and language. No gross focal neurologic deficits are appreciated. No gait instability. Skin:  Skin is warm, dry. Laceration as described above. Psychiatric: Mood and affect are normal. Speech and behavior are normal.  ____________________________________________   LABS (all labs ordered are listed, but only abnormal results are displayed)  Labs Reviewed - No data to display  RADIOLOGY Fifth finger x-ray per radiologist: Comminuted fracture of the distal tuft. I, Tommi Rumpshonda L Nataliyah Packham, personally viewed and evaluated these images (plain radiographs) as part of my medical decision making, as well as reviewing the written report by the radiologist.  ____________________________________________   PROCEDURES  Procedure(s) performed: LACERATION REPAIR Performed by: Tommi Rumpshonda L Riyanshi Wahab Authorized by: Tommi Rumpshonda L Chamaine Stankus Consent: Verbal consent obtained. Risks and benefits: risks, benefits and alternatives were discussed Consent given by: patient Patient identity confirmed: provided  demographic data Prepped and Draped in normal sterile fashion Wound explored  Laceration Location: Left fifth finger distal aspect.  Laceration Length: 2.0 cm  No Foreign Bodies seen or palpated  Anesthesia: Digital block   Local anesthetic: lidocaine 1 % without epinephrine  Anesthetic total: 7 ml  Irrigation method: syringe Amount of cleaning: standard  Skin closure: 4-0 Ethilon   Number of sutures: 4   Technique: Simple interrupted   Patient tolerance: Patient tolerated the procedure well with no immediate complications.  Procedures  Critical Care performed: No  ____________________________________________   INITIAL IMPRESSION / ASSESSMENT AND PLAN / ED COURSE  Pertinent labs & imaging results that were available during my care of the patient were reviewed by me and considered in my medical decision making (see chart for details).    Clinical Course   Finger was irrigated numerous times and also soaked in a solution of half saline half Betadine. No foreign body was noted. Digital block was effective and patient tolerated procedure well. Patient was given a prescription for Keflex  500 mg 4 times a day for 7 days along with Norco as needed for pain. Laceration was wrapped and patient was placed in a finger splint. He is follow-up with Dr. Rosita Kea who is the orthopedist on call. He is instructed to keep area clean and dry and watch for signs of infection. He is instructed to return to the emergency room if any signs of infection or fever.  ____________________________________________   FINAL CLINICAL IMPRESSION(S) / ED DIAGNOSES  Final diagnoses:  Fracture of fifth finger, left, open, initial encounter  Laceration of fifth finger of left hand, initial encounter      NEW MEDICATIONS STARTED DURING THIS VISIT:  Discharge Medication List as of 02/03/2016  1:18 PM    START taking these medications   Details  cephALEXin (KEFLEX) 500 MG capsule Take 1 capsule (500  mg total) by mouth 4 (four) times daily., Starting Wed 02/03/2016, Print    HYDROcodone-acetaminophen (NORCO/VICODIN) 5-325 MG tablet Take 1-2 tablets by mouth every 4 (four) hours as needed for moderate pain., Starting Wed 02/03/2016, Print         Note:  This document was prepared using Dragon voice recognition software and may include unintentional dictation errors.    Tommi Rumps, PA-C 02/03/16 1442    Gayla Doss, MD 02/03/16 [phone]

## 2016-02-04 DIAGNOSIS — S62609B Fracture of unspecified phalanx of unspecified finger, initial encounter for open fracture: Secondary | ICD-10-CM | POA: Insufficient documentation

## 2016-03-01 ENCOUNTER — Other Ambulatory Visit: Payer: Self-pay | Admitting: Internal Medicine

## 2016-04-12 ENCOUNTER — Other Ambulatory Visit: Payer: Self-pay | Admitting: Internal Medicine

## 2016-04-12 NOTE — Telephone Encounter (Signed)
faxed

## 2016-04-12 NOTE — Telephone Encounter (Signed)
Done hardcopy to Corinne  

## 2016-05-03 ENCOUNTER — Other Ambulatory Visit: Payer: Self-pay | Admitting: Internal Medicine

## 2016-07-07 ENCOUNTER — Other Ambulatory Visit: Payer: Self-pay | Admitting: Internal Medicine

## 2016-07-25 ENCOUNTER — Other Ambulatory Visit: Payer: Self-pay | Admitting: Internal Medicine

## 2016-08-22 ENCOUNTER — Other Ambulatory Visit: Payer: Self-pay | Admitting: Internal Medicine

## 2016-08-23 NOTE — Telephone Encounter (Signed)
Done hardcopy to Anna  

## 2016-08-23 NOTE — Telephone Encounter (Signed)
Script [facility] to [facility]...Raechel Chute/lmb

## 2016-09-22 ENCOUNTER — Encounter: Payer: Self-pay | Admitting: Internal Medicine

## 2016-09-28 ENCOUNTER — Other Ambulatory Visit: Payer: Self-pay | Admitting: Internal Medicine

## 2016-10-17 ENCOUNTER — Other Ambulatory Visit: Payer: Self-pay | Admitting: Internal Medicine

## 2016-10-17 NOTE — Telephone Encounter (Signed)
Routing to dr john, please advise, thanks 

## 2016-10-18 NOTE — Telephone Encounter (Signed)
faxed

## 2016-10-18 NOTE — Telephone Encounter (Signed)
Done hardcopy to Shirron  One month refill today - please ask pt to make rov for further refills

## 2016-10-20 ENCOUNTER — Telehealth: Payer: Self-pay | Admitting: *Deleted

## 2016-10-20 ENCOUNTER — Ambulatory Visit (INDEPENDENT_AMBULATORY_CARE_PROVIDER_SITE_OTHER): Payer: BLUE CROSS/BLUE SHIELD | Admitting: Internal Medicine

## 2016-10-20 ENCOUNTER — Encounter: Payer: Self-pay | Admitting: Internal Medicine

## 2016-10-20 ENCOUNTER — Other Ambulatory Visit (INDEPENDENT_AMBULATORY_CARE_PROVIDER_SITE_OTHER): Payer: BLUE CROSS/BLUE SHIELD

## 2016-10-20 VITALS — BP 120/82 | HR 80 | Ht 68.0 in | Wt 244.0 lb

## 2016-10-20 DIAGNOSIS — R739 Hyperglycemia, unspecified: Secondary | ICD-10-CM | POA: Insufficient documentation

## 2016-10-20 DIAGNOSIS — Z0000 Encounter for general adult medical examination without abnormal findings: Secondary | ICD-10-CM

## 2016-10-20 DIAGNOSIS — Z114 Encounter for screening for human immunodeficiency virus [HIV]: Secondary | ICD-10-CM

## 2016-10-20 DIAGNOSIS — G4710 Hypersomnia, unspecified: Secondary | ICD-10-CM | POA: Diagnosis not present

## 2016-10-20 DIAGNOSIS — E291 Testicular hypofunction: Secondary | ICD-10-CM | POA: Diagnosis not present

## 2016-10-20 DIAGNOSIS — D751 Secondary polycythemia: Secondary | ICD-10-CM | POA: Diagnosis not present

## 2016-10-20 DIAGNOSIS — I10 Essential (primary) hypertension: Secondary | ICD-10-CM | POA: Diagnosis not present

## 2016-10-20 DIAGNOSIS — K429 Umbilical hernia without obstruction or gangrene: Secondary | ICD-10-CM | POA: Diagnosis not present

## 2016-10-20 LAB — CBC WITH DIFFERENTIAL/PLATELET
BASOS ABS: 0 10*3/uL (ref 0.0–0.1)
BASOS PCT: 0.6 % (ref 0.0–3.0)
EOS ABS: 0.4 10*3/uL (ref 0.0–0.7)
Eosinophils Relative: 4.3 % (ref 0.0–5.0)
HCT: 55.1 % — ABNORMAL HIGH (ref 39.0–52.0)
Hemoglobin: 19 g/dL (ref 13.0–17.0)
LYMPHS PCT: 23.2 % (ref 12.0–46.0)
Lymphs Abs: 1.9 10*3/uL (ref 0.7–4.0)
MCHC: 34.8 g/dL (ref 30.0–36.0)
MCV: 90.3 fl (ref 78.0–100.0)
MONOS PCT: 10.2 % (ref 3.0–12.0)
Monocytes Absolute: 0.9 10*3/uL (ref 0.1–1.0)
NEUTROS ABS: 5.2 10*3/uL (ref 1.4–7.7)
Neutrophils Relative %: 61.7 % (ref 43.0–77.0)
PLATELETS: 241 10*3/uL (ref 150.0–400.0)
RBC: 6.1 Mil/uL — AB (ref 4.22–5.81)
RDW: 14.5 % (ref 11.5–15.5)
WBC: 8.4 10*3/uL (ref 4.0–10.5)

## 2016-10-20 LAB — BASIC METABOLIC PANEL
BUN: 9 mg/dL (ref 6–23)
CALCIUM: 9.3 mg/dL (ref 8.4–10.5)
CO2: 28 mEq/L (ref 19–32)
CREATININE: 1.03 mg/dL (ref 0.40–1.50)
Chloride: 104 mEq/L (ref 96–112)
GFR: 83.05 mL/min (ref >60.00)
Glucose, Bld: 92 mg/dL (ref 70–99)
Potassium: 3.7 mEq/L (ref 3.5–5.1)
Sodium: 138 mEq/L (ref 135–145)

## 2016-10-20 LAB — URINALYSIS, ROUTINE W REFLEX MICROSCOPIC
BILIRUBIN URINE: NEGATIVE
Hgb urine dipstick: NEGATIVE
KETONES UR: NEGATIVE
LEUKOCYTES UA: NEGATIVE
Nitrite: NEGATIVE
RBC / HPF: NONE SEEN (ref 0–?)
Total Protein, Urine: NEGATIVE
URINE GLUCOSE: NEGATIVE
UROBILINOGEN UA: 0.2 (ref 0.0–1.0)
WBC, UA: NONE SEEN (ref 0–?)
pH: 7 (ref 5.0–8.0)

## 2016-10-20 LAB — LIPID PANEL
CHOL/HDL RATIO: 6
Cholesterol: 184 mg/dL (ref 0–200)
HDL: 33.1 mg/dL — AB (ref >39.00)
LDL CALC: 113 mg/dL — AB (ref 0–99)
NONHDL: 150.41
Triglycerides: 189 mg/dL — ABNORMAL HIGH (ref 0.0–149.0)
VLDL: 37.8 mg/dL (ref 0.0–40.0)

## 2016-10-20 LAB — TESTOSTERONE: Testosterone: 574.67 ng/dL (ref 300.00–890.00)

## 2016-10-20 LAB — TSH: TSH: 2 u[IU]/mL (ref 0.35–4.50)

## 2016-10-20 LAB — HEPATIC FUNCTION PANEL
ALT: 32 U/L (ref 0–53)
AST: 16 U/L (ref 0–37)
Albumin: 4.4 g/dL (ref 3.5–5.2)
Alkaline Phosphatase: 51 U/L (ref 39–117)
BILIRUBIN TOTAL: 0.7 mg/dL (ref 0.2–1.2)
Bilirubin, Direct: 0.1 mg/dL (ref 0.0–0.3)
Total Protein: 7.4 g/dL (ref 6.0–8.3)

## 2016-10-20 LAB — PSA: PSA: 0.73 ng/mL (ref 0.10–4.00)

## 2016-10-20 MED ORDER — HYDROCHLOROTHIAZIDE 12.5 MG PO TABS: 12.5000 mg | ORAL_TABLET | Freq: Every day | ORAL | 3 refills | Status: DC

## 2016-10-20 MED ORDER — FLUTICASONE PROPIONATE 50 MCG/ACT NA SUSP: NASAL | 11 refills | Status: DC

## 2016-10-20 MED ORDER — AMLODIPINE BESYLATE 5 MG PO TABS: 5.0000 mg | ORAL_TABLET | Freq: Every day | ORAL | 3 refills | Status: DC

## 2016-10-20 MED ORDER — ALPRAZOLAM 0.5 MG PO TABS: 0.5000 mg | ORAL_TABLET | Freq: Three times a day (TID) | ORAL | 2 refills | Status: DC

## 2016-10-20 NOTE — Assessment & Plan Note (Signed)
stable overall by history and exam, recent data reviewed with pt, and pt to continue medical treatment as before,  to f/u any worsening symptoms or concerns BP Readings from Last 3 Encounters:  10/20/16 120/82  02/03/16 125/89  09/22/15 124/82

## 2016-10-20 NOTE — Assessment & Plan Note (Signed)
Followed by [facility] [place] MD - pt prefers f/u there

## 2016-10-20 NOTE — Assessment & Plan Note (Signed)

## 2016-10-20 NOTE — Telephone Encounter (Signed)
CRITICAL HEMOGLOBIN @ 19

## 2016-10-20 NOTE — Assessment & Plan Note (Signed)
stable overall by history and exam, recent data reviewed with pt, and pt to continue medical treatment as before,  to f/u any worsening symptoms or concerns Lab Results  Component Value Date   HGBA1C 5.4 09/22/2015

## 2016-10-20 NOTE — Assessment & Plan Note (Signed)
Relatively small, reducible, nontender, ok to follow, declines surgical referral

## 2016-10-20 NOTE — Progress Notes (Signed)
Subjective:    Patient ID: Gabriel Moon, male    DOB: Dec 10, 1970, 46 y.o.   MRN: [id number]  HPI  Here for wellness and f/u;  Overall doing ok;  Pt denies Chest pain, worsening SOB, DOE, wheezing, orthopnea, PND, worsening LE edema, palpitations, dizziness or syncope.  Pt denies neurological change such as new headache, facial or extremity weakness.  Pt denies polydipsia, polyuria, or low sugar symptoms. Pt states overall good compliance with treatment and medications, good tolerability, and has been trying to follow appropriate diet.  Pt denies worsening depressive symptoms, suicidal ideation or panic. No fever, night sweats, wt loss, loss of appetite, or other constitutional symptoms.  Pt states good ability with ADL's, has low fall risk, home safety reviewed and adequate, no other significant changes in hearing or vision, and only occasionally active with exercise.   Has hx of polycythemia due to testosterone tx, last hgb about 21 about 5 wks ago, and testost dose lowered, [facility] [place] [facility] - Dr Ellin Goodieabinowitz.  Non smoker. Has gained wt over 10lbs in past yr despite stating he has been eating better, tryiung to avoid DM like his mother.  Feels he has had some worsening sleep apnea related fatigue and snoring with the wt gain, and was able to lose wt before to close to 220, so does not want further eval or tx for this.  Also has a small umbilical hernia increased in size, but non tender/no pain and declines need for surgical referral. Denies worsening reflux, abd pain, dysphagia, n/v, bowel change or blood. Wt Readings from Last 3 Encounters:  10/20/16 244 lb (110.7 kg)  02/03/16 235 lb (106.6 kg)  09/22/15 231 lb (104.8 kg)   BP Readings from Last 3 Encounters:  10/20/16 120/82  02/03/16 125/89  09/22/15 124/82   Past Medical History:  Diagnosis Date  . ANXIETY 01/13/2007  . Arthritis    OSTEO, MIDFOOT  . Chronic LBP 04/19/2011  . Headache   . HYPERLIPIDEMIA 01/13/2007  .  Hypertension    LOST WEIGHT, NOT ON MEDS   . NEPHROLITHIASIS, HX OF 04/26/2007  . Rhinitis, allergic   . Testosterone deficiency    MALE HYPOGONADISM   Past Surgical History:  Procedure Laterality Date  . BONE EXOSTOSIS EXCISION Right 11/27/2014   Procedure: PARTIAL EXCISION OF TALUS;  Surgeon: Recardo EvangelistMatthew Troxler, DPM;  [facility];  Service: Podiatry;  Laterality: Right;    reports that he quit smoking about 14 years ago. His smoking use included Cigarettes. He has a 15.00 pack-year smoking history. He has never used smokeless tobacco. He reports that he drinks about 1.2 oz of alcohol per week . He reports that he does not use drugs. family history is not on file. Allergies  Allergen Reactions  . Lexapro [Escitalopram Oxalate]     BAD REACTION-SUICIDAL   Current Outpatient Prescriptions on File Prior to Visit  Medication Sig Dispense Refill  . ALPRAZolam (XANAX) 0.5 MG tablet take 1 tablet by mouth three times a day 90 tablet 0  . amLODipine (NORVASC) 5 MG tablet Take 1 tablet (5 mg total) by mouth daily. 90 tablet 3  . cephALEXin (KEFLEX) 500 MG capsule Take 1 capsule (500 mg total) by mouth 4 (four) times daily. 21 capsule 0  . cetirizine (ZYRTEC) 10 MG tablet Take 1 tablet (10 mg total) by mouth daily. 90 tablet 3  . fluticasone (FLONASE) 50 MCG/ACT nasal spray instill 2 sprays into each nostril once daily 16  g 1  . hydrochlorothiazide (HYDRODIURIL) 12.5 MG tablet Take 12.5 mg by mouth daily.    Marland Kitchen HYDROcodone-acetaminophen (NORCO/VICODIN) 5-325 MG tablet Take 1-2 tablets by mouth every 4 (four) hours as needed for moderate pain. 30 tablet 0  . testosterone cypionate (DEPOTESTOSTERONE CYPIONATE) 200 MG/ML injection Inject 0.25 mLs (50 mg total) into the muscle once a week. 10 mL 0   No current facility-administered medications on file prior to visit.    Review of Systems Constitutional: Negative for other unusual diaphoresis, sweats, appetite or weight changes HENT:  Negative for other worsening hearing loss, ear pain, facial swelling, mouth sores or neck stiffness.   Eyes: Negative for other worsening pain, redness or other visual disturbance.  Respiratory: Negative for other stridor or swelling Cardiovascular: Negative for other palpitations or other chest pain  Gastrointestinal: Negative for worsening diarrhea or loose stools, blood in stool, distention or other pain Genitourinary: Negative for hematuria, flank pain or other change in urine volume.  Musculoskeletal: Negative for myalgias or other joint swelling.  Skin: Negative for other color change, or other wound or worsening drainage.  Neurological: Negative for other syncope or numbness. Hematological: Negative for other adenopathy or swelling Psychiatric/Behavioral: Negative for hallucinations, other worsening agitation, SI, self-injury, or new decreased concentration All other system neg per pt    Objective:   Physical Exam BP 120/82   Pulse 80   Ht 5\' 8"  (1.727 m)   Wt 244 lb (110.7 kg)   SpO2 99%   BMI 37.10 kg/m  VS noted,  Constitutional: Pt is oriented to person, place, and time. Appears well-developed and well-nourished, in no significant distress and comfortable Head: Normocephalic and atraumatic  Eyes: Conjunctivae and EOM are normal. Pupils are equal, round, and reactive to light Right Ear: External ear normal without discharge Left Ear: External ear normal without discharge Nose: Nose without discharge or deformity Mouth/Throat: Oropharynx is without other ulcerations and moist  Neck: Normal range of motion. Neck supple. No JVD present. No tracheal deviation present or significant neck LA or mass Cardiovascular: Normal rate, regular rhythm, normal heart sounds and intact distal pulses.   Pulmonary/Chest: WOB normal and breath sounds without rales or wheezing  Abdominal: Soft. Bowel sounds are normal. NT. No HSM , + small to medium umbilical hernia noted , non tender,  reducible Musculoskeletal: Normal range of motion. Exhibits no edema Lymphadenopathy: Has no other cervical adenopathy.  Neurological: Pt is alert and oriented to person, place, and time. Pt has normal reflexes. No cranial nerve deficit. Motor grossly intact, Gait intact Skin: Skin is warm and dry. No rash noted or new ulcerations Psychiatric:  Has normal mood and affect. Behavior is normal without agitation No other exam findings  Lab Results  Component Value Date   WBC 8.0 09/22/2015   HGB 16.0 09/22/2015   HCT 47.3 09/22/2015   PLT 370.0 09/22/2015   GLUCOSE 101 (H) 09/22/2015   CHOL 174 09/17/2014   TRIG 152.0 (H) 09/17/2014   HDL 30.90 (L) 09/17/2014   LDLDIRECT 116.1 10/24/2012   LDLCALC 113 (H) 09/17/2014   ALT 35 09/22/2015   AST 15 09/22/2015   NA 138 09/22/2015   K 4.2 09/22/2015   CL 102 09/22/2015   CREATININE 0.94 09/22/2015   BUN 12 09/22/2015   CO2 28 09/22/2015   TSH 1.77 09/22/2015   PSA 0.65 09/22/2015   HGBA1C 5.4 09/22/2015        Assessment & Plan:

## 2016-10-20 NOTE — Progress Notes (Signed)
Pre visit review using our clinic review tool, if applicable. No additional management support is needed unless otherwise documented below in the visit note. 

## 2016-10-20 NOTE — Assessment & Plan Note (Signed)
Mild worsening but declines pulm referral, plans to lose wt first

## 2016-10-20 NOTE — Patient Instructions (Signed)
Please continue all other medications as before, and refills have been done if requested.  Please have the pharmacy call with any other refills you may need.  Please continue your efforts at being more active, low cholesterol diet, and weight control.  You are otherwise up to date with prevention measures today.  Please continue your follow up on the blood count with Dr Ellin Goodieabinowitz  Please keep your appointments with your specialists as you may have planned  Your lab work was done today  You will be contacted by phone if any changes need to be made immediately.  Otherwise, you will receive a letter about your results with an explanation, but please check with MyChart first.  Please remember to sign up for MyChart if you have not done so, as this will be important to you in the future with finding out test results, communicating by private email, and scheduling acute appointments online when needed.  Please return in 1 year for your yearly visit, or sooner if needed, with Lab testing done 3-5 days before

## 2017-04-07 ENCOUNTER — Other Ambulatory Visit: Payer: Self-pay | Admitting: Internal Medicine

## 2017-04-07 NOTE — Telephone Encounter (Signed)
Faxed

## 2017-04-07 NOTE — Telephone Encounter (Signed)
Done hardcopy to Shirron  

## 2017-07-25 ENCOUNTER — Telehealth: Payer: Self-pay | Admitting: Internal Medicine

## 2017-07-25 MED ORDER — ALPRAZOLAM 0.5 MG PO TABS: 0.5000 mg | ORAL_TABLET | Freq: Three times a day (TID) | ORAL | 2 refills | Status: DC

## 2017-07-25 NOTE — Telephone Encounter (Signed)
Xanax done erx 

## 2017-08-07 DIAGNOSIS — J40 Bronchitis, not specified as acute or chronic: Secondary | ICD-10-CM | POA: Diagnosis not present

## 2017-08-07 DIAGNOSIS — R062 Wheezing: Secondary | ICD-10-CM | POA: Diagnosis not present

## 2017-08-22 ENCOUNTER — Ambulatory Visit
Admission: RE | Admit: 2017-08-22 | Discharge: 2017-08-22 | Disposition: A | Discharge status: Home / Self Care | Payer: BLUE CROSS/BLUE SHIELD | Source: Ambulatory Visit | Attending: Family Medicine | Admitting: Family Medicine

## 2017-08-22 ENCOUNTER — Other Ambulatory Visit: Payer: Self-pay | Admitting: Family Medicine

## 2017-08-22 DIAGNOSIS — R059 Cough, unspecified: Secondary | ICD-10-CM

## 2017-08-22 DIAGNOSIS — R05 Cough: Secondary | ICD-10-CM

## 2017-10-24 ENCOUNTER — Ambulatory Visit (INDEPENDENT_AMBULATORY_CARE_PROVIDER_SITE_OTHER): Payer: BLUE CROSS/BLUE SHIELD | Admitting: Internal Medicine

## 2017-10-24 ENCOUNTER — Other Ambulatory Visit (INDEPENDENT_AMBULATORY_CARE_PROVIDER_SITE_OTHER): Payer: BLUE CROSS/BLUE SHIELD

## 2017-10-24 ENCOUNTER — Encounter: Payer: Self-pay | Admitting: Internal Medicine

## 2017-10-24 ENCOUNTER — Other Ambulatory Visit: Payer: Self-pay | Admitting: Internal Medicine

## 2017-10-24 ENCOUNTER — Telehealth: Payer: Self-pay | Admitting: *Deleted

## 2017-10-24 VITALS — BP 118/82 | HR 87 | Temp 98.5°F | Ht 68.0 in | Wt 252.0 lb

## 2017-10-24 DIAGNOSIS — R739 Hyperglycemia, unspecified: Secondary | ICD-10-CM

## 2017-10-24 DIAGNOSIS — E785 Hyperlipidemia, unspecified: Secondary | ICD-10-CM | POA: Diagnosis not present

## 2017-10-24 DIAGNOSIS — I10 Essential (primary) hypertension: Secondary | ICD-10-CM

## 2017-10-24 DIAGNOSIS — Z0000 Encounter for general adult medical examination without abnormal findings: Secondary | ICD-10-CM | POA: Diagnosis not present

## 2017-10-24 DIAGNOSIS — D751 Secondary polycythemia: Secondary | ICD-10-CM | POA: Diagnosis not present

## 2017-10-24 DIAGNOSIS — Z114 Encounter for screening for human immunodeficiency virus [HIV]: Secondary | ICD-10-CM

## 2017-10-24 DIAGNOSIS — E669 Obesity, unspecified: Secondary | ICD-10-CM

## 2017-10-24 DIAGNOSIS — E291 Testicular hypofunction: Secondary | ICD-10-CM

## 2017-10-24 DIAGNOSIS — F411 Generalized anxiety disorder: Secondary | ICD-10-CM | POA: Diagnosis not present

## 2017-10-24 LAB — CBC WITH DIFFERENTIAL/PLATELET
BASOS ABS: 0.1 10*3/uL (ref 0.0–0.1)
Basophils Relative: 0.7 % (ref 0.0–3.0)
EOS ABS: 0.6 10*3/uL (ref 0.0–0.7)
Eosinophils Relative: 6.8 % — ABNORMAL HIGH (ref 0.0–5.0)
HCT: 53.4 % — ABNORMAL HIGH (ref 39.0–52.0)
Hemoglobin: 18.7 g/dL (ref 13.0–17.0)
LYMPHS PCT: 20.2 % (ref 12.0–46.0)
Lymphs Abs: 1.8 10*3/uL (ref 0.7–4.0)
MCHC: 35 g/dL (ref 30.0–36.0)
MCV: 89.2 fl (ref 78.0–100.0)
MONO ABS: 0.9 10*3/uL (ref 0.1–1.0)
Monocytes Relative: 10.3 % (ref 3.0–12.0)
NEUTROS ABS: 5.6 10*3/uL (ref 1.4–7.7)
Neutrophils Relative %: 62 % (ref 43.0–77.0)
PLATELETS: 294 10*3/uL (ref 150.0–400.0)
RBC: 5.99 Mil/uL — ABNORMAL HIGH (ref 4.22–5.81)
RDW: 14.6 % (ref 11.5–15.5)
WBC: 9 10*3/uL (ref 4.0–10.5)

## 2017-10-24 LAB — URINALYSIS, ROUTINE W REFLEX MICROSCOPIC
Bilirubin Urine: NEGATIVE
HGB URINE DIPSTICK: NEGATIVE
KETONES UR: NEGATIVE
Leukocytes, UA: NEGATIVE
NITRITE: NEGATIVE
RBC / HPF: NONE SEEN (ref 0–?)
Total Protein, Urine: NEGATIVE
Urine Glucose: NEGATIVE
Urobilinogen, UA: 0.2 (ref 0.0–1.0)
pH: 7.5 (ref 5.0–8.0)

## 2017-10-24 LAB — BASIC METABOLIC PANEL
BUN: 9 mg/dL (ref 6–23)
CHLORIDE: 98 meq/L (ref 96–112)
CO2: 32 meq/L (ref 19–32)
CREATININE: 1.13 mg/dL (ref 0.40–1.50)
Calcium: 9.4 mg/dL (ref 8.4–10.5)
GFR: 74.29 mL/min (ref >60.00)
GLUCOSE: 82 mg/dL (ref 70–99)
POTASSIUM: 3.8 meq/L (ref 3.5–5.1)
Sodium: 137 mEq/L (ref 135–145)

## 2017-10-24 LAB — HEPATIC FUNCTION PANEL
ALK PHOS: 56 U/L (ref 39–117)
ALT: 28 U/L (ref 0–53)
AST: 15 U/L (ref 0–37)
Albumin: 4.3 g/dL (ref 3.5–5.2)
BILIRUBIN DIRECT: 0.1 mg/dL (ref 0.0–0.3)
BILIRUBIN TOTAL: 0.7 mg/dL (ref 0.2–1.2)
Total Protein: 7.2 g/dL (ref 6.0–8.3)

## 2017-10-24 LAB — LIPID PANEL
CHOLESTEROL: 210 mg/dL — AB (ref 0–200)
HDL: 29.7 mg/dL — ABNORMAL LOW (ref >39.00)
NonHDL: 180.78
Total CHOL/HDL Ratio: 7
Triglycerides: 316 mg/dL — ABNORMAL HIGH (ref 0.0–149.0)
VLDL: 63.2 mg/dL — AB (ref 0.0–40.0)

## 2017-10-24 LAB — HEMOGLOBIN A1C: Hgb A1c MFr Bld: 5.8 % (ref 4.6–6.5)

## 2017-10-24 LAB — TSH: TSH: 2.89 u[IU]/mL (ref 0.35–4.50)

## 2017-10-24 LAB — PSA: PSA: 0.78 ng/mL (ref 0.10–4.00)

## 2017-10-24 LAB — LDL CHOLESTEROL, DIRECT: LDL DIRECT: 144 mg/dL

## 2017-10-24 MED ORDER — TESTOSTERONE CYPIONATE 200 MG/ML IM SOLN: 200.0000 mg | INTRAMUSCULAR | 0 refills | Status: DC

## 2017-10-24 MED ORDER — HYDROCHLOROTHIAZIDE 12.5 MG PO TABS: 12.5000 mg | ORAL_TABLET | Freq: Every day | ORAL | 3 refills | Status: DC

## 2017-10-24 MED ORDER — AMLODIPINE BESYLATE 5 MG PO TABS
5.0000 mg | ORAL_TABLET | Freq: Every day | ORAL | 3 refills | Status: DC
Start: 2017-10-24 — End: 2018-10-15

## 2017-10-24 MED ORDER — ALPRAZOLAM 0.5 MG PO TABS: 0.5000 mg | ORAL_TABLET | Freq: Three times a day (TID) | ORAL | 2 refills | Status: DC

## 2017-10-24 NOTE — Assessment & Plan Note (Signed)
stable overall by history and exam, recent data reviewed with pt, and pt to continue medical treatment as before,  to f/u any worsening symptoms or concerns  

## 2017-10-24 NOTE — Assessment & Plan Note (Signed)
Currently being managed with phlebotomy and frequent lab draws per [facility]; pt being referred to endo for hypogonadism management, suspect he should work towards stopping the phlebotomies with changed medical management

## 2017-10-24 NOTE — Assessment & Plan Note (Signed)

## 2017-10-24 NOTE — Assessment & Plan Note (Signed)
Will need endo referral in [place] per pt request

## 2017-10-24 NOTE — Assessment & Plan Note (Signed)
For med nutrition referral

## 2017-10-24 NOTE — Telephone Encounter (Signed)
Lab called and spoke w/scheduler Lelon Mast) have a critical for pt. Hemoglobin 18.7 Per chart pt is seeing MD at 1:00 for his CPX. Forwarding to MD for FYI....Raechel Chute

## 2017-10-24 NOTE — Assessment & Plan Note (Signed)
D/w pt, declines statin, will work on lower chol diet

## 2017-10-24 NOTE — Assessment & Plan Note (Signed)
stable overall by history and exam, recent data reviewed with pt, and pt to continue medical treatment as before,  to f/u any worsening symptoms or concerns Lab Results  Component Value Date   HGBA1C 5.4 09/22/2015  for f/u lab today

## 2017-10-24 NOTE — Progress Notes (Signed)
Subjective:    Patient ID: Gabriel Moon, male    DOB: Feb 11, 1971, 47 y.o.   MRN: [id number]  HPI  Here for wellness and f/u;  Overall doing ok;  Pt denies Chest pain, worsening SOB, DOE, wheezing, orthopnea, PND, worsening LE edema, palpitations, dizziness or syncope.  Pt denies neurological change such as new headache, facial or extremity weakness.  Pt denies polydipsia, polyuria, or low sugar symptoms. Pt states overall good compliance with treatment and medications, good tolerability, and has been trying to follow appropriate diet.  Pt denies worsening depressive symptoms, suicidal ideation or panic. No fever, night sweats, wt loss, loss of appetite, or other constitutional symptoms.  Pt states good ability with ADL's, has low fall risk, home safety reviewed and adequate, no other significant changes in hearing or vision, and not active with exercise.  Has gained some wt almost 20 lbs in 2 yrs.   Wt Readings from Last 3 Encounters:  10/24/17 252 lb (114.3 kg)  10/20/16 244 lb (110.7 kg)  02/03/16 235 lb (106.6 kg)  [facility]es [facility] for hormone replacement/testosterone, had significant polycythemia last yr at 19.0 hgb, has apparently been managed with recent phlebotomies that his girlfriend trauma nurse agreed was "different."   Pt asks for endo referral for other management of this.   No other interval hx or change Past Medical History:  Diagnosis Date  . ANXIETY 01/13/2007  . Arthritis    OSTEO, MIDFOOT  . Chronic LBP 04/19/2011  . Headache   . HYPERLIPIDEMIA 01/13/2007  . Hypertension    LOST WEIGHT, NOT ON MEDS   . NEPHROLITHIASIS, HX OF 04/26/2007  . Rhinitis, allergic   . Testosterone deficiency    MALE HYPOGONADISM   Past Surgical History:  Procedure Laterality Date  . BONE EXOSTOSIS EXCISION Right 11/27/2014   Procedure: PARTIAL EXCISION OF TALUS;  Surgeon: Recardo Evangelist, DPM;  Location: [facility];  Service: Podiatry;  Laterality: Right;    reports that he quit  smoking about 15 years ago. His smoking use included cigarettes. He has a 15.00 pack-year smoking history. He has never used smokeless tobacco. He reports that he drinks about 1.2 oz of alcohol per week. He reports that he does not use drugs. family history is not on file. Allergies  Allergen Reactions  . Lexapro [Escitalopram Oxalate]     BAD REACTION-SUICIDAL  ' Current Outpatient Medications on File Prior to Visit  Medication Sig Dispense Refill  . cetirizine (ZYRTEC) 10 MG tablet Take 1 tablet (10 mg total) by mouth daily. 90 tablet 3  . fluticasone (FLONASE) 50 MCG/ACT nasal spray instill 2 sprays into each nostril once daily 16 g 11   No current facility-administered medications on file prior to visit.    Review of Systems Constitutional: Negative for other unusual diaphoresis, sweats, appetite or weight changes HENT: Negative for other worsening hearing loss, ear pain, facial swelling, mouth sores or neck stiffness.   Eyes: Negative for other worsening pain, redness or other visual disturbance.  Respiratory: Negative for other stridor or swelling Cardiovascular: Negative for other palpitations or other chest pain  Gastrointestinal: Negative for worsening diarrhea or loose stools, blood in stool, distention or other pain Genitourinary: Negative for hematuria, flank pain or other change in urine volume.  Musculoskeletal: Negative for myalgias or other joint swelling.  Skin: Negative for other color change, or other wound or worsening drainage.  Neurological: Negative for other syncope or numbness. Hematological: Negative for other adenopathy or swelling Psychiatric/Behavioral: Negative  for hallucinations, other worsening agitation, SI, self-injury, or new decreased concentration All other system neg per pt    Objective:   Physical Exam BP 118/82   Pulse 87   Temp 98.5 F (36.9 C) (Oral)   Ht  (1.727 m)   Wt 252 lb (114.3 kg)   SpO2 98%   BMI 38.32 kg/m  VS noted,    Constitutional: Pt is oriented to person, place, and time. Appears well-developed and well-nourished, in no significant distress and comfortable Head: Normocephalic and atraumatic  Eyes: Conjunctivae and EOM are normal. Pupils are equal, round, and reactive to light Right Ear: External ear normal without discharge Left Ear: External ear normal without discharge Nose: Nose without discharge or deformity Mouth/Throat: Oropharynx is without other ulcerations and moist  Neck: Normal range of motion. Neck supple. No JVD present. No tracheal deviation present or significant neck LA or mass Cardiovascular: Normal rate, regular rhythm, normal heart sounds and intact distal pulses.   Pulmonary/Chest: WOB normal and breath sounds without rales or wheezing  Abdominal: Soft. Bowel sounds are normal. NT. No HSM  Musculoskeletal: Normal range of motion. Exhibits no edema Lymphadenopathy: Has no other cervical adenopathy.  Neurological: Pt is alert and oriented to person, place, and time. Pt has normal reflexes. No cranial nerve deficit. Motor grossly intact, Gait intact Skin: Skin is warm and dry. No rash noted or new ulcerations Psychiatric:  Has mild nervous mood and affect. Behavior is normal without agitation No other exam findings  Lab Results  Component Value Date   WBC 8.4 10/20/2016   HGB 19.0 Repeated and verified X2. (HH) 10/20/2016   HCT 55.1 (H) 10/20/2016   PLT 241.0 10/20/2016   GLUCOSE 92 10/20/2016   CHOL 184 10/20/2016   TRIG 189.0 (H) 10/20/2016   HDL 33.10 (L) 10/20/2016   LDLDIRECT 116.1 10/24/2012   LDLCALC 113 (H) 10/20/2016   ALT 32 10/20/2016   AST 16 10/20/2016   NA 138 10/20/2016   K 3.7 10/20/2016   CL 104 10/20/2016   CREATININE 1.03 10/20/2016   BUN 9 10/20/2016   CO2 28 10/20/2016   TSH 2.00 10/20/2016   PSA 0.73 10/20/2016   HGBA1C 5.4 09/22/2015       Assessment & Plan:

## 2017-10-24 NOTE — Patient Instructions (Signed)
Please continue all other medications as before, and refills have been done if requested.  Please have the pharmacy call with any other refills you may need.  Please continue your efforts at being more active, low cholesterol diet, and weight control.  You are otherwise up to date with prevention measures today.  Please keep your appointments with your specialists as you may have planned  You will be contacted regarding the referral for: Endocrinology and Medical Nutrition  Your lab work from today is pending  You will be contacted by phone if any changes need to be made immediately.  Otherwise, you will receive a letter about your results with an explanation, but please check with MyChart first.  Please remember to sign up for MyChart if you have not done so, as this will be important to you in the future with finding out test results, communicating by private email, and scheduling acute appointments online when needed.  Please return in 1 year for your yearly visit, or sooner if needed, with Lab testing done 3-5 days before

## 2017-10-25 NOTE — Telephone Encounter (Signed)
MD address at [facility] closing encounter.Marland KitchenRaechel Chute

## 2017-12-20 ENCOUNTER — Encounter: Payer: Self-pay | Admitting: Internal Medicine

## 2018-02-20 ENCOUNTER — Other Ambulatory Visit: Payer: Self-pay | Admitting: Internal Medicine

## 2018-02-21 NOTE — Telephone Encounter (Signed)
   LOV:10/24/17 NextOV:10/26/18 Last Filled/Quantity: 01/24/18 90#

## 2018-02-21 NOTE — Telephone Encounter (Signed)
Done erx 

## 2018-04-30 ENCOUNTER — Other Ambulatory Visit: Payer: Self-pay | Admitting: Internal Medicine

## 2018-05-23 ENCOUNTER — Ambulatory Visit: Payer: BLUE CROSS/BLUE SHIELD | Attending: Otolaryngology

## 2018-05-23 DIAGNOSIS — G4733 Obstructive sleep apnea (adult) (pediatric): Secondary | ICD-10-CM | POA: Insufficient documentation

## 2018-05-30 ENCOUNTER — Other Ambulatory Visit: Payer: Self-pay | Admitting: Internal Medicine

## 2018-05-30 NOTE — Telephone Encounter (Signed)
Done erx 

## 2018-07-11 DIAGNOSIS — J329 Chronic sinusitis, unspecified: Secondary | ICD-10-CM | POA: Diagnosis not present

## 2018-07-11 DIAGNOSIS — Z6838 Body mass index (BMI) 38.0-38.9, adult: Secondary | ICD-10-CM | POA: Diagnosis not present

## 2018-07-26 DIAGNOSIS — J0101 Acute recurrent maxillary sinusitis: Secondary | ICD-10-CM | POA: Diagnosis not present

## 2018-07-26 DIAGNOSIS — R6889 Other general symptoms and signs: Secondary | ICD-10-CM | POA: Diagnosis not present

## 2018-08-22 ENCOUNTER — Ambulatory Visit: Payer: BLUE CROSS/BLUE SHIELD | Attending: Neurology

## 2018-08-22 DIAGNOSIS — G4733 Obstructive sleep apnea (adult) (pediatric): Secondary | ICD-10-CM | POA: Diagnosis not present

## 2018-09-04 ENCOUNTER — Other Ambulatory Visit: Payer: Self-pay | Admitting: Internal Medicine

## 2018-09-04 NOTE — Telephone Encounter (Signed)
Done erx 

## 2018-09-05 ENCOUNTER — Other Ambulatory Visit: Payer: Self-pay | Admitting: Internal Medicine

## 2018-09-06 LAB — CBC WITH DIFFERENTIAL/PLATELET
BASOS: 1 %
Basophils Absolute: 0.1 10*3/uL (ref 0.0–0.2)
EOS (ABSOLUTE): 0.4 10*3/uL (ref 0.0–0.4)
Eos: 5 %
Hematocrit: 56.6 % — ABNORMAL HIGH (ref 37.5–51.0)
Hemoglobin: 19.8 g/dL — ABNORMAL HIGH (ref 13.0–17.7)
Immature Grans (Abs): 0.1 10*3/uL (ref 0.0–0.1)
Immature Granulocytes: 1 %
Lymphocytes Absolute: 2.1 10*3/uL (ref 0.7–3.1)
Lymphs: 25 %
MCH: 31.7 pg (ref 26.6–33.0)
MCHC: 35 g/dL (ref 31.5–35.7)
MCV: 91 fL (ref 79–97)
Monocytes Absolute: 1 10*3/uL — ABNORMAL HIGH (ref 0.1–0.9)
Monocytes: 12 %
NEUTROS ABS: 4.7 10*3/uL (ref 1.4–7.0)
Neutrophils: 56 %
Platelets: 274 10*3/uL (ref 150–450)
RBC: 6.24 x10E6/uL — ABNORMAL HIGH (ref 4.14–5.80)
RDW: 15.1 % (ref 11.6–15.4)
WBC: 8.5 10*3/uL (ref 3.4–10.8)

## 2018-09-11 ENCOUNTER — Telehealth: Payer: Self-pay

## 2018-09-11 DIAGNOSIS — G4733 Obstructive sleep apnea (adult) (pediatric): Secondary | ICD-10-CM | POA: Diagnosis not present

## 2018-09-11 NOTE — Telephone Encounter (Signed)
-----   Message from Corwin Levins, MD sent at 09/10/2018  6:25 PM EDT ----- Letter sent, cont same tx  The test results show that your current treatment is OK, except the Hemoglobin is the highest on record  We need to ask you to stop the testosterone treatment, as higher Hemoglobin can lead to stroke.    Namita Yearwood to please inform pt, and take the testosterone off the med list

## 2018-09-11 NOTE — Telephone Encounter (Signed)
Pt has viewed results via MyChart  

## 2018-09-12 DIAGNOSIS — G4733 Obstructive sleep apnea (adult) (pediatric): Secondary | ICD-10-CM | POA: Diagnosis not present

## 2018-09-21 ENCOUNTER — Other Ambulatory Visit: Payer: Self-pay

## 2018-09-23 LAB — CMP12+LP+TP+TSH+6AC+PSA+CBC…
AST: 18 IU/L (ref 0–40)
Albumin/Globulin Ratio: 1.7 (ref 1.2–2.2)
Albumin: 4.3 g/dL (ref 4.0–5.0)
Alkaline Phosphatase: 54 IU/L (ref 39–117)
BUN/Creatinine Ratio: 11 (ref 9–20)
BUN: 11 mg/dL (ref 6–24)
Basophils Absolute: 0.1 10*3/uL (ref 0.0–0.2)
Basos: 1 %
Bilirubin Total: 0.8 mg/dL (ref 0.0–1.2)
Calcium: 9.4 mg/dL (ref 8.7–10.2)
Chloride: 102 mmol/L (ref 96–106)
Chol/HDL Ratio: 5.8 ratio — ABNORMAL HIGH (ref 0.0–5.0)
Cholesterol, Total: 169 mg/dL (ref 100–199)
Creatinine, Ser: 1 mg/dL (ref 0.76–1.27)
EOS (ABSOLUTE): 0.4 10*3/uL (ref 0.0–0.4)
Eos: 5 %
Estimated CHD Risk: 1.2 times avg. — ABNORMAL HIGH (ref 0.0–1.0)
Free Thyroxine Index: 1.6 (ref 1.2–4.9)
GFR calc Af Amer: 104 mL/min/{1.73_m2} (ref >59)
GFR calc non Af Amer: 90 mL/min/{1.73_m2} (ref >59)
GGT: 36 IU/L (ref 0–65)
Globulin, Total: 2.6 g/dL (ref 1.5–4.5)
Glucose: 82 mg/dL (ref 65–99)
HDL: 29 mg/dL — ABNORMAL LOW (ref >39)
Hematocrit: 52.2 % — ABNORMAL HIGH (ref 37.5–51.0)
Hemoglobin: 18.3 g/dL — ABNORMAL HIGH (ref 13.0–17.7)
Immature Grans (Abs): 0.1 10*3/uL (ref 0.0–0.1)
Iron: 171 ug/dL — ABNORMAL HIGH (ref 38–169)
LDH: 198 IU/L (ref 121–224)
Lymphs: 21 %
MCH: 31.7 pg (ref 26.6–33.0)
MCHC: 35.1 g/dL (ref 31.5–35.7)
MCV: 91 fL (ref 79–97)
Monocytes Absolute: 0.8 10*3/uL (ref 0.1–0.9)
Monocytes: 11 %
Neutrophils Absolute: 4.6 10*3/uL (ref 1.4–7.0)
Neutrophils: 61 %
Phosphorus: 3 mg/dL (ref 2.8–4.1)
Platelets: 221 10*3/uL (ref 150–450)
Potassium: 3.8 mmol/L (ref 3.5–5.2)
Prostate Specific Ag, Serum: 0.7 ng/mL (ref 0.0–4.0)
RBC: 5.77 x10E6/uL (ref 4.14–5.80)
RDW: 14.1 % (ref 11.6–15.4)
Sodium: 142 mmol/L (ref 134–144)
T3 Uptake Ratio: 30 % (ref 24–39)
T4, Total: 5.4 ug/dL (ref 4.5–12.0)
Total Protein: 6.9 g/dL (ref 6.0–8.5)
Triglycerides: 177 mg/dL — ABNORMAL HIGH (ref 0–149)
Uric Acid: 6.3 mg/dL (ref 3.7–8.6)
VLDL Cholesterol Cal: 35 mg/dL (ref 5–40)
WBC: 7.5 10*3/uL (ref 3.4–10.8)

## 2018-09-23 LAB — TESTOSTERONE,FREE AND TOTAL
Testosterone, Free: 24.6 pg/mL — ABNORMAL HIGH (ref 6.8–21.5)
Testosterone: 827 ng/dL (ref 264–916)

## 2018-09-23 LAB — CMP12+LP+TP+TSH+6AC+PSA+CBC?
ALT: 45 IU/L — ABNORMAL HIGH (ref 0–44)
Immature Granulocytes: 1 %
LDL Calculated: 105 mg/dL — ABNORMAL HIGH (ref 0–99)
Lymphocytes Absolute: 1.6 10*3/uL (ref 0.7–3.1)
TSH: 1.54 u[IU]/mL (ref 0.450–4.500)

## 2018-10-12 DIAGNOSIS — G4733 Obstructive sleep apnea (adult) (pediatric): Secondary | ICD-10-CM | POA: Diagnosis not present

## 2018-10-14 ENCOUNTER — Other Ambulatory Visit: Payer: Self-pay | Admitting: Internal Medicine

## 2018-10-26 ENCOUNTER — Ambulatory Visit (INDEPENDENT_AMBULATORY_CARE_PROVIDER_SITE_OTHER): Payer: BLUE CROSS/BLUE SHIELD | Admitting: Internal Medicine

## 2018-10-26 DIAGNOSIS — Z0000 Encounter for general adult medical examination without abnormal findings: Secondary | ICD-10-CM | POA: Diagnosis not present

## 2018-10-26 DIAGNOSIS — E291 Testicular hypofunction: Secondary | ICD-10-CM | POA: Diagnosis not present

## 2018-10-26 DIAGNOSIS — R739 Hyperglycemia, unspecified: Secondary | ICD-10-CM

## 2018-10-26 DIAGNOSIS — I10 Essential (primary) hypertension: Secondary | ICD-10-CM

## 2018-10-26 DIAGNOSIS — D751 Secondary polycythemia: Secondary | ICD-10-CM

## 2018-10-26 DIAGNOSIS — Z114 Encounter for screening for human immunodeficiency virus [HIV]: Secondary | ICD-10-CM | POA: Diagnosis not present

## 2018-10-26 MED ORDER — HYDROCHLOROTHIAZIDE 12.5 MG PO CAPS: 12.5000 mg | ORAL_CAPSULE | Freq: Every day | ORAL | 3 refills | Status: DC

## 2018-10-26 NOTE — Progress Notes (Signed)
Patient ID: Gabriel Moon, male   DOB: [birth year], 48 y.o.   MRN: [id number]  Virtual Visit via Video Note  I connected with Gabriel MallingPaul C Morlock on 10/26/18 at  1:20 PM EDT by a video enabled telemedicine application and verified that I am speaking with the correct person using two identifiers.  Location: Patient: at home Provider: at office   I discussed the limitations of evaluation and management by telemedicine and the availability of in person appointments. The patient expressed understanding and agreed to proceed.  History of Present Illness: Here for wellness and f/u;  Overall doing ok;  Pt denies Chest pain, worsening SOB, DOE, wheezing, orthopnea, PND, worsening LE edema, palpitations, dizziness or syncope.  Pt denies neurological change such as new headache, facial or extremity weakness.  Pt denies polydipsia, polyuria, or low sugar symptoms. Pt states overall good compliance with treatment and medications, good tolerability, and has been trying to follow appropriate diet.  Pt denies worsening depressive symptoms, suicidal ideation or panic. No fever, night sweats, wt loss, loss of appetite, or other constitutional symptoms.  Pt states good ability with ADL's, has low fall risk, home safety reviewed and adequate, no other significant changes in hearing or vision, and only occasionally active with exercise.  OSA symptoms much improved with CPAP and 25 lbs wt loss. Plans to f/u with allergist 6/25.  [facility] with recent worsening polycythemia and testosterone use for hypogonadism, had to decrease the testosterone dose.  Plans to f/u there Past Medical History:  Diagnosis Date  . ANXIETY 01/13/2007  . Arthritis    OSTEO, MIDFOOT  . Chronic LBP 04/19/2011  . Headache   . HYPERLIPIDEMIA 01/13/2007  . Hypertension    LOST WEIGHT, NOT ON MEDS   . NEPHROLITHIASIS, HX OF 04/26/2007  . Rhinitis, allergic   . Testosterone deficiency    MALE HYPOGONADISM   Past  Surgical History:  Procedure Laterality Date  . BONE EXOSTOSIS EXCISION Right 11/27/2014   Procedure: PARTIAL EXCISION OF TALUS;  Surgeon: Recardo EvangelistMatthew Troxler, DPM;  [facility];  Service: Podiatry;  Laterality: Right;    reports that he quit smoking about 16 years ago. His smoking use included cigarettes. He has a 15.00 pack-year smoking history. He has never used smokeless tobacco. He reports current alcohol use of about 2.0 standard drinks of alcohol per week. He reports that he does not use drugs. family history is not on file. Allergies  Allergen Reactions  . Lexapro [Escitalopram Oxalate]     BAD REACTION-SUICIDAL   Current Outpatient Medications on File Prior to Visit  Medication Sig Dispense Refill  . ALPRAZolam (XANAX) 0.5 MG tablet TAKE 1 TABLET(0.5 MG) BY MOUTH THREE TIMES DAILY AS NEEDED 90 tablet 2  . amLODipine (NORVASC) 5 MG tablet TAKE 1 TABLET(5 MG) BY MOUTH DAILY 90 tablet 0  . cetirizine (ZYRTEC) 10 MG tablet Take 1 tablet (10 mg total) by mouth daily. 90 tablet 3  . fluticasone (FLONASE) 50 MCG/ACT nasal spray INSTILL 2 SPRAYS IN EACH NOSTRIL ONCE DAILY 16 g 2  . testosterone cypionate (DEPOTESTOSTERONE CYPIONATE) 200 MG/ML injection Inject 1 mL (200 mg total) into the muscle once a week. 10 mL 0   No current facility-administered medications on file prior to visit.    Observations/Objective: Alert, NAD, appropriate mood and affect, resps normal, cn 2-12 intact, moves all 4s, no visible rash or swelling Lab Results  Component Value Date   WBC 7.5 09/21/2018  HGB 18.3 (H) 09/21/2018   HCT 52.2 (H) 09/21/2018   PLT 221 09/21/2018   GLUCOSE 82 09/21/2018   CHOL 169 09/21/2018   TRIG 177 (H) 09/21/2018   HDL 29 (L) 09/21/2018   LDLDIRECT 144.0 10/24/2017   LDLCALC 105 (H) 09/21/2018   ALT 45 (H) 09/21/2018   AST 18 09/21/2018   NA 142 09/21/2018   K 3.8 09/21/2018   CL 102 09/21/2018   CREATININE 1.00 09/21/2018   BUN 11 09/21/2018   CO2 32  10/24/2017   TSH 1.540 09/21/2018   PSA 0.78 10/24/2017   HGBA1C 5.8 10/24/2017   Assessment and Plan: See notes  Follow Up Instructions: See notes   I discussed the assessment and treatment plan with the patient. The patient was provided an opportunity to ask questions and all were answered. The patient agreed with the plan and demonstrated an understanding of the instructions.   The patient was advised to call back or seek an in-person evaluation if the symptoms worsen or if the condition fails to improve as anticipated.  [name], MD

## 2018-10-26 NOTE — Patient Instructions (Signed)
Ok to change the HCT tablet to capsules  Please continue all other medications as before, and refills have been done if requested.  Please have the pharmacy call with any other refills you may need.  Please continue your efforts at being more active, low cholesterol diet, and weight control.  You are otherwise up to date with prevention measures today.  Please keep your appointments with your specialists as you may have planned  Please return in 1 year for your yearly visit, or sooner if needed

## 2018-10-27 NOTE — Assessment & Plan Note (Signed)
stable overall by history and exam, recent data reviewed with pt, and pt to continue medical treatment as before,  to f/u any worsening symptoms or concerns  

## 2018-10-27 NOTE — Assessment & Plan Note (Signed)
Declines endo referral

## 2018-10-27 NOTE — Assessment & Plan Note (Signed)
Encouraged to f/u BP at home and next visit, goal < 140/90 

## 2018-10-27 NOTE — Assessment & Plan Note (Signed)

## 2018-10-27 NOTE — Assessment & Plan Note (Signed)
Most likely due to testosterone tx, declines heme onc referral

## 2018-11-07 ENCOUNTER — Other Ambulatory Visit: Payer: Self-pay

## 2018-11-07 ENCOUNTER — Encounter: Payer: Self-pay | Admitting: Internal Medicine

## 2018-11-07 DIAGNOSIS — N529 Male erectile dysfunction, unspecified: Secondary | ICD-10-CM

## 2018-11-07 DIAGNOSIS — E291 Testicular hypofunction: Secondary | ICD-10-CM

## 2018-11-08 LAB — CBC WITH DIFFERENTIAL/PLATELET
Basophils Absolute: 0.1 10*3/uL (ref 0.0–0.2)
Basos: 1 %
EOS (ABSOLUTE): 0.3 10*3/uL (ref 0.0–0.4)
Eos: 4 %
Hematocrit: 53.4 % — ABNORMAL HIGH (ref 37.5–51.0)
Hemoglobin: 18.6 g/dL — ABNORMAL HIGH (ref 13.0–17.7)
Immature Grans (Abs): 0.1 10*3/uL (ref 0.0–0.1)
Immature Granulocytes: 1 %
Lymphocytes Absolute: 1.7 10*3/uL (ref 0.7–3.1)
Lymphs: 23 %
MCH: 31.7 pg (ref 26.6–33.0)
MCHC: 34.8 g/dL (ref 31.5–35.7)
MCV: 91 fL (ref 79–97)
Monocytes Absolute: 0.8 10*3/uL (ref 0.1–0.9)
Monocytes: 10 %
Neutrophils Absolute: 4.5 10*3/uL (ref 1.4–7.0)
Neutrophils: 61 %
Platelets: 203 10*3/uL (ref 150–450)
RBC: 5.86 x10E6/uL — ABNORMAL HIGH (ref 4.14–5.80)
RDW: 13 % (ref 11.6–15.4)
WBC: 7.5 10*3/uL (ref 3.4–10.8)

## 2018-11-08 LAB — TESTOSTERONE,FREE AND TOTAL
Testosterone, Free: 9.2 pg/mL (ref 6.8–21.5)
Testosterone: 183 ng/dL — ABNORMAL LOW (ref 264–916)

## 2018-11-11 DIAGNOSIS — G4733 Obstructive sleep apnea (adult) (pediatric): Secondary | ICD-10-CM | POA: Diagnosis not present

## 2018-11-28 ENCOUNTER — Other Ambulatory Visit: Payer: Self-pay

## 2018-11-29 LAB — CBC WITH DIFFERENTIAL/PLATELET
Basophils Absolute: 0.1 10*3/uL (ref 0.0–0.2)
Basos: 1 %
EOS (ABSOLUTE): 0.5 10*3/uL — ABNORMAL HIGH (ref 0.0–0.4)
Eos: 5 %
Hematocrit: 47.5 % (ref 37.5–51.0)
Hemoglobin: 16.8 g/dL (ref 13.0–17.7)
Immature Grans (Abs): 0.1 10*3/uL (ref 0.0–0.1)
Immature Granulocytes: 2 %
Lymphocytes Absolute: 2 10*3/uL (ref 0.7–3.1)
Lymphs: 24 %
MCH: 32.7 pg (ref 26.6–33.0)
MCHC: 35.4 g/dL (ref 31.5–35.7)
MCV: 93 fL (ref 79–97)
Monocytes Absolute: 0.8 10*3/uL (ref 0.1–0.9)
Monocytes: 10 %
Neutrophils Absolute: 4.8 10*3/uL (ref 1.4–7.0)
Neutrophils: 58 %
Platelets: 246 10*3/uL (ref 150–450)
RBC: 5.13 x10E6/uL (ref 4.14–5.80)
RDW: 13.6 % (ref 11.6–15.4)
WBC: 8.3 10*3/uL (ref 3.4–10.8)

## 2018-12-05 ENCOUNTER — Telehealth: Payer: Self-pay | Admitting: Internal Medicine

## 2018-12-05 MED ORDER — ALPRAZOLAM 0.5 MG PO TABS: ORAL_TABLET | ORAL | 2 refills | Status: DC

## 2018-12-05 NOTE — Telephone Encounter (Signed)
Done erx 

## 2018-12-11 ENCOUNTER — Encounter: Payer: Self-pay | Admitting: Urology

## 2018-12-11 ENCOUNTER — Ambulatory Visit (INDEPENDENT_AMBULATORY_CARE_PROVIDER_SITE_OTHER): Payer: BC Managed Care – PPO | Admitting: Urology

## 2018-12-11 ENCOUNTER — Other Ambulatory Visit: Payer: Self-pay

## 2018-12-11 VITALS — BP 160/92 | HR 66 | Ht 68.5 in | Wt 251.2 lb

## 2018-12-11 DIAGNOSIS — E291 Testicular hypofunction: Secondary | ICD-10-CM | POA: Diagnosis not present

## 2018-12-11 NOTE — Patient Instructions (Signed)
Testosterone Replacement Therapy    Testosterone replacement therapy (TRT) is used to treat men who have a low testosterone level (hypogonadism). Testosterone is a male hormone that is produced in the testicles. It is responsible for typically male characteristics and for maintaining a man's sex drive and the ability to get an erection. Testosterone also supports bone and muscle health. TRT can be a gel, liquid, or patch that you put on your skin. It can also be in the form of a tablet or an injection. In some cases, your health care provider may insert long-acting pellets under your skin.  In most men, the level of testosterone starts to decline gradually after age 45. Low testosterone can also be caused by certain medical conditions, medicines, and obesity. Your health care provider can diagnose hypogonadism with at least two blood tests that are done early in the morning.  Low testosterone may not need to be treated. TRT is usually a choice that you make with your health care provider. Your health care provider may recommend TRT if you have low testosterone that is causing symptoms, such as:  · Low sex drive.  · Erection problems.  · Breast enlargement.  · Loss of body hair.  · Weak muscles or bones.  · Shrinking testicles.  · Increased body fat.  · Low energy.  · Hot flashes.  · Depression.  · Decreased work performance.  TRT is a lifetime treatment. If you stop treatment, your testosterone will drop, and your symptoms may return.  What are the risks?  Testosterone replacement therapy may have side effects, including:  · Lower sperm count.  · Skin irritation at the application or injection site.  · Mouth irritation if you take an oral tablet.  · Acne.  · Swelling of your legs or feet.  · Tender breasts.  · Dizziness.  · Sleep disturbance.  · Mood swings.  · Possible increased risk of stroke or heart attack.  Testosterone replacement therapy may also increase your risk for prostate cancer or male breast cancer.  You should not use TRT if you have either of those conditions. Your health care provider also may not recommend TRT if:  · You are suspected of having prostate cancer.  · You want to father a child.  · You have a high number of red blood cells.  · You have untreated sleep apnea.  · You have a very large prostate.  Supplies needed:  · Your health care provider will prescribe the testosterone gel, solution, or medicine that you need. If your health care provider teaches you to do self-injections at home, you will also need:  ? Your medicine vial.  ? Disposable needles and syringes.  ? Alcohol swabs.  ? A needle disposal container.  ? Adhesive bandages.  How to use testosterone replacement therapy  Your health care provider will help you find the TRT option that will work best for you based on your preference, the side effects, and the cost. You may:  · Rub testosterone gel on your upper arm or shoulder every day after a shower. This is the most common type of TRT. Do not let women or children come in contact with the gel.  · Apply a testosterone solution under your arms once each day.  · Place a testosterone patch on your skin once each day.  · Dissolve a testosterone tablet in your mouth twice each day.  · Have a testosterone pellet inserted under your skin by your health care   provider. This will be replaced every 3-6 months.  · Use testosterone nasal spray three times each day.  · Get testosterone injections. For some types of testosterone, your health care provider will give you this injection. With other types of testosterone, you may be taught to give injections to yourself. The frequency of injections may vary based on the type of testosterone that you receive.  Follow these instructions at home:  · Take over-the-counter and prescription medicines only as told by your health care provider.  · Lose weight if you are overweight. Ask your health care provider to help you start a healthy diet and exercise program to  reach and maintain a healthy weight.  · Work with your health care provider to treat other medical conditions that may lower your testosterone. These include obesity, high blood pressure, high cholesterol, diabetes, liver disease, kidney disease, and sleep apnea.  · Keep all follow-up visits as told by your health care provider. This is important.  General recommendations  · Discuss all risks and benefits with your health care provider before starting therapy.  · Work with your health care provider to check your prostate health and do blood testing before you start therapy.  · Do not use any testosterone replacement therapies that are not prescribed by your health care provider or not approved for use in the [place]  · Do not use TRT for bodybuilding or to improve sexual performance. TRT should be used only to treat symptoms of low testosterone.  · Return for all repeat prostate checks and blood tests during therapy, as told by your health care provider.  Where to find more information  Learn more about testosterone replacement therapy from:  · American U[employer] Foundation: www.urologyhealth.org/urologic-conditions/low-testosterone-(hypogonadism)  · Endocrine Society: www.hormone.org/diseases-and-conditions/mens-health/hypogonadism  Contact a health care provider if:  · You have side effects from your testosterone replacement therapy.  · You continue to have symptoms of low testosterone during treatment.  · You develop new symptoms during treatment.  Summary  · Testosterone replacement therapy is only for men who have low testosterone as determined by blood testing and who have symptoms of low testosterone.  · Testosterone replacement therapy should be prescribed only by a health care provider and should be used under the supervision of a health care provider.  · You may not be able to take testosterone if you have certain medical conditions, including prostate cancer, male breast cancer, or heart  disease.  · Testosterone replacement therapy may have side effects and may make some medical conditions worse.  · Talk with your health care provider about all the risks and benefits before you start therapy.  This information is not intended to replace advice given to you by your health care provider. Make sure you discuss any questions you have with your health care provider.  Document Released: 02/25/2016 Document Revised: 02/25/2016 Document Reviewed: 02/25/2016  Elsevier Interactive Patient Education © 2019 Elsevier Inc.

## 2018-12-11 NOTE — Progress Notes (Signed)
12/11/2018 8:53 AM   Dolores Lory Jul 27, 1970 [id number]  Referring provider: Biagio Borg, MD [address] [place],  [place] [address]  Chief Complaint  Patient presents with  . Hypogonadism    HPI: Gabriel Moon is a 48 year old male with a long history of hypogonadism on TRT.  He presents today for a second opinion regarding dosing and management.  Approximately 5 years ago he had significant tiredness, fatigue and decreased libido.  He was diagnosed with low testosterone and was started on Clomid and subsequently switched to injections.  Recently he has had erythrocytosis and states he undergoes phlebotomy every 2 months with the city.  His dose was decreased approximately 2 months ago to 100 mg every 2 weeks.  A testosterone level on 11/07/2018 was 183 with a free testosterone of 9.2 on 11/07/2018.  Hematocrit was 47.5  He does note with his current dose increased tiredness, fatigue and increased symptoms prior to his next injection.  He is due for an injection tomorrow.   PMH: Past Medical History:  Diagnosis Date  . ANXIETY 01/13/2007  . Arthritis    OSTEO, MIDFOOT  . Chronic LBP 04/19/2011  . Headache   . HYPERLIPIDEMIA 01/13/2007  . Hypertension    LOST WEIGHT, NOT ON MEDS   . NEPHROLITHIASIS, HX OF 04/26/2007  . Rhinitis, allergic   . Testosterone deficiency    MALE HYPOGONADISM    Surgical History: Past Surgical History:  Procedure Laterality Date  . BONE EXOSTOSIS EXCISION Right 11/27/2014   Procedure: PARTIAL EXCISION OF TALUS;  Surgeon: Albertine Patricia, DPM;  Location: [facility];  Service: Podiatry;  Laterality: Right;    Home Medications:  Allergies as of 12/11/2018      Reactions   Lexapro [escitalopram Oxalate]    BAD REACTION-SUICIDAL      Medication List       Accurate as of December 11, 2018  8:53 AM. If you have any questions, ask your nurse or doctor.        ALPRAZolam 0.5 MG tablet Commonly known as: XANAX TAKE 1  TABLET(0.5 MG) BY MOUTH THREE TIMES DAILY AS NEEDED   amLODipine 5 MG tablet Commonly known as: NORVASC TAKE 1 TABLET(5 MG) BY MOUTH DAILY   cetirizine 10 MG tablet Commonly known as: ZYRTEC Take 1 tablet (10 mg total) by mouth daily.   fluticasone 50 MCG/ACT nasal spray Commonly known as: FLONASE INSTILL 2 SPRAYS IN EACH NOSTRIL ONCE DAILY   hydrochlorothiazide 12.5 MG capsule Commonly known as: Microzide Take 1 capsule (12.5 mg total) by mouth daily.   meloxicam 15 MG tablet Commonly known as: MOBIC TK 1 T PO IN THE MORNING WF PRN.   testosterone cypionate 200 MG/ML injection Commonly known as: DEPOTESTOSTERONE CYPIONATE Inject 1 mL (200 mg total) into the muscle once a week. What changed: how much to take       Allergies:  Allergies  Allergen Reactions  . Lexapro [Escitalopram Oxalate]     BAD REACTION-SUICIDAL    Family History: No family history on file.  Social History:  reports that he quit smoking about 16 years ago. His smoking use included cigarettes. He has a 15.00 pack-year smoking history. He has never used smokeless tobacco. He reports current alcohol use of about 2.0 standard drinks of alcohol per week. He reports that he does not use drugs.  ROS: UROLOGY Frequent Urination?: No Hard to postpone urination?: No Burning/pain with urination?: No Get up at night to urinate?:  No Leakage of urine?: No Urine stream starts and stops?: No Trouble starting stream?: No Do you have to strain to urinate?: No Blood in urine?: No Urinary tract infection?: No Sexually transmitted disease?: No Injury to kidneys or bladder?: No Painful intercourse?: No Weak stream?: No Erection problems?: No Penile pain?: No  Gastrointestinal Nausea?: No Vomiting?: No Indigestion/heartburn?: No Diarrhea?: No Constipation?: No  Constitutional Fever: No Night sweats?: No Weight loss?: No Fatigue?: No  Skin Skin rash/lesions?: No Itching?: No  Eyes Blurred  vision?: No Double vision?: No  Ears/Nose/Throat Sore throat?: No Sinus problems?: No  Hematologic/Lymphatic Swollen glands?: No Easy bruising?: No  Cardiovascular Leg swelling?: No Chest pain?: No  Respiratory Cough?: No Shortness of breath?: No  Endocrine Excessive thirst?: No  Musculoskeletal Back pain?: No Joint pain?: No  Neurological Headaches?: No Dizziness?: No  Psychologic Depression?: No Anxiety?: No  Physical Exam: BP (!) 160/92 (BP Location: Left Arm, Patient Position: Sitting, Cuff Size: Large)   Pulse 66   Ht 5' 8.5" (1.74 m)   Wt 251 lb 3.2 oz (113.9 kg)   BMI 37.64 kg/m   Constitutional:  Alert and oriented, No acute distress. HEENT: [place] AT, moist mucus membranes.  Trachea midline, no masses. Cardiovascular: No clubbing, cyanosis, or edema. Respiratory: Normal respiratory effort, no increased work of breathing. Skin: No rashes, bruises or suspicious lesions. Neurologic: Grossly intact, no focal deficits, moving all 4 extremities. Psychiatric: Normal mood and affect.  Laboratory Data: Lab Results  Component Value Date   WBC 8.3 11/28/2018   HGB 16.8 11/28/2018   HCT 47.5 11/28/2018   MCV 93 11/28/2018   PLT 246 11/28/2018    Lab Results  Component Value Date   CREATININE 1.00 09/21/2018    Lab Results  Component Value Date   PSA 0.78 10/24/2017   PSA 0.73 10/20/2016   PSA 0.65 09/22/2015    Lab Results  Component Value Date   TESTOSTERONE 183 (L) 11/07/2018    Assessment & Plan:   48 year old male with symptomatic hypogonadism.  Current dose is 1 mg every 2 weeks which is low and would at least increase to a weekly dose.  Repeat testosterone and hematocrit today.  [facility] which may have less effect on his RBC count.  He was provided literature.  He does not want to transfer care of his testosterone to our practice at this time and was primarily interested in a second opinion.  Riki AltesScott C Stoioff,  MD  [facility] [address], [address] [place], [place] [id number] [phone]

## 2018-12-12 ENCOUNTER — Ambulatory Visit: Payer: Self-pay

## 2018-12-12 ENCOUNTER — Other Ambulatory Visit: Payer: Self-pay

## 2018-12-12 DIAGNOSIS — J310 Chronic rhinitis: Secondary | ICD-10-CM | POA: Diagnosis not present

## 2018-12-12 DIAGNOSIS — J329 Chronic sinusitis, unspecified: Secondary | ICD-10-CM | POA: Diagnosis not present

## 2018-12-12 DIAGNOSIS — G4733 Obstructive sleep apnea (adult) (pediatric): Secondary | ICD-10-CM | POA: Diagnosis not present

## 2018-12-12 DIAGNOSIS — R7989 Other specified abnormal findings of blood chemistry: Secondary | ICD-10-CM

## 2018-12-12 LAB — HEMATOCRIT: Hematocrit: 49.9 % (ref 37.5–51.0)

## 2018-12-12 LAB — TESTOSTERONE: Testosterone: 110 ng/dL — ABNORMAL LOW (ref 264–916)

## 2018-12-12 MED ORDER — TESTOSTERONE CYPIONATE 200 MG/ML IM SOLN
150.0000 mg | INTRAMUSCULAR | Status: DC
  Administered 2018-12-12 – 2018-12-26 (×2): 150 mg via INTRAMUSCULAR

## 2018-12-14 ENCOUNTER — Telehealth: Payer: Self-pay | Admitting: Family Medicine

## 2018-12-14 ENCOUNTER — Encounter: Payer: Self-pay | Admitting: Urology

## 2018-12-14 ENCOUNTER — Telehealth: Payer: Self-pay | Admitting: *Deleted

## 2018-12-14 NOTE — Telephone Encounter (Signed)
Patient wants the [facility] sent to [facility]

## 2018-12-14 NOTE — Telephone Encounter (Signed)
The problem with the chain pharmacies is they will not try to get the medication authorized.  He has a better chance of getting it approved if we go through [facility] in [place].  If the medication is approved they will ship it to him.

## 2018-12-14 NOTE — Telephone Encounter (Addendum)
Left patient a VM to return call  ----- Message from Abbie Sons, MD sent at 12/13/2018  7:25 AM EDT ----- Testosterone level low at 110.  Hematocrit normal at 49.9.  We had discussed seeing if insurance would approve Xyosted versus changing his testosterone cypionate dose.  He was going to think over these options.

## 2018-12-14 NOTE — Telephone Encounter (Signed)
Pt returned call and would like a call back at [phone].  He said Beca left him a message.

## 2018-12-17 NOTE — Telephone Encounter (Signed)
Patient notified and will try [facility]. What dose Berdine Addison would you like him to use? There are 3 in Epic

## 2018-12-18 MED ORDER — XYOSTED 75 MG/0.5ML ~~LOC~~ SOAJ: 75.0000 mg | SUBCUTANEOUS | 3 refills | Status: DC

## 2018-12-18 NOTE — Telephone Encounter (Signed)
rx sent

## 2018-12-26 ENCOUNTER — Other Ambulatory Visit: Payer: Self-pay

## 2018-12-26 ENCOUNTER — Ambulatory Visit: Payer: Managed Care, Other (non HMO)

## 2018-12-26 DIAGNOSIS — R7989 Other specified abnormal findings of blood chemistry: Secondary | ICD-10-CM

## 2019-02-26 ENCOUNTER — Other Ambulatory Visit: Payer: Self-pay | Admitting: Internal Medicine

## 2019-02-26 MED ORDER — AMLODIPINE BESYLATE 5 MG PO TABS: 5.0000 mg | ORAL_TABLET | Freq: Every day | ORAL | 1 refills | Status: DC

## 2019-02-27 ENCOUNTER — Telehealth: Payer: Self-pay

## 2019-02-27 NOTE — Telephone Encounter (Signed)
I would find out if he wants [place] to take over testosterone prescribing and monitoring.  If so I will send a refill however he would need a follow-up visit and a testosterone level.  If he wants his PCP to continue then would let them take over prescribing.

## 2019-02-27 NOTE — Telephone Encounter (Signed)
[facility] is requesting a refill on Xyosted for the patient, your last not says patient was only seen for a second opinion he has been getting injections w/ PCP. He has no f/u here. Do you want to continue refilling this or should PCP take over?

## 2019-02-27 NOTE — Telephone Encounter (Signed)
See my chart message

## 2019-03-01 ENCOUNTER — Other Ambulatory Visit: Payer: Self-pay | Admitting: Urology

## 2019-03-01 ENCOUNTER — Other Ambulatory Visit: Payer: Self-pay

## 2019-03-01 DIAGNOSIS — E291 Testicular hypofunction: Secondary | ICD-10-CM

## 2019-03-01 MED ORDER — XYOSTED 75 MG/0.5ML ~~LOC~~ SOAJ: 75.0000 mg | SUBCUTANEOUS | 2 refills | Status: DC

## 2019-03-15 ENCOUNTER — Encounter: Payer: Self-pay | Admitting: Internal Medicine

## 2019-03-15 MED ORDER — ALPRAZOLAM 0.5 MG PO TABS: ORAL_TABLET | ORAL | 2 refills | Status: DC

## 2019-03-15 NOTE — Telephone Encounter (Signed)
Done erx 

## 2019-03-29 ENCOUNTER — Other Ambulatory Visit: Payer: Self-pay

## 2019-03-29 ENCOUNTER — Other Ambulatory Visit: Payer: 59

## 2019-03-29 DIAGNOSIS — E291 Testicular hypofunction: Secondary | ICD-10-CM

## 2019-03-30 LAB — HEMOGLOBIN AND HEMATOCRIT, BLOOD
Hematocrit: 51.6 % — ABNORMAL HIGH (ref 37.5–51.0)
Hemoglobin: 17.9 g/dL — ABNORMAL HIGH (ref 13.0–17.7)

## 2019-03-30 LAB — TESTOSTERONE: Testosterone: 275 ng/dL (ref 264–916)

## 2019-03-30 LAB — PSA: Prostate Specific Ag, Serum: 0.6 ng/mL (ref 0.0–4.0)

## 2019-04-03 ENCOUNTER — Encounter: Payer: Self-pay | Admitting: Urology

## 2019-04-03 ENCOUNTER — Other Ambulatory Visit: Payer: Self-pay

## 2019-04-03 ENCOUNTER — Ambulatory Visit (INDEPENDENT_AMBULATORY_CARE_PROVIDER_SITE_OTHER): Payer: 59 | Admitting: Urology

## 2019-04-03 ENCOUNTER — Encounter: Payer: Self-pay | Admitting: Registered Nurse

## 2019-04-03 ENCOUNTER — Ambulatory Visit: Payer: Managed Care, Other (non HMO) | Admitting: Registered Nurse

## 2019-04-03 VITALS — BP 135/100 | HR 76 | Temp 98.4°F | Resp 12 | Ht 68.5 in | Wt 253.0 lb

## 2019-04-03 VITALS — BP 162/106 | HR 83 | Ht 68.5 in | Wt 253.0 lb

## 2019-04-03 DIAGNOSIS — E291 Testicular hypofunction: Secondary | ICD-10-CM | POA: Diagnosis not present

## 2019-04-03 DIAGNOSIS — J301 Allergic rhinitis due to pollen: Secondary | ICD-10-CM

## 2019-04-03 DIAGNOSIS — T63481A Toxic effect of venom of other arthropod, accidental (unintentional), initial encounter: Secondary | ICD-10-CM

## 2019-04-03 MED ORDER — PREDNISONE 10 MG (21) PO TBPK: ORAL_TABLET | ORAL | 0 refills | Status: DC

## 2019-04-03 MED ORDER — XYOSTED 100 MG/0.5ML ~~LOC~~ SOAJ: 100.0000 mg | SUBCUTANEOUS | 2 refills | Status: DC

## 2019-04-03 MED ORDER — IBUPROFEN 800 MG PO TABS: 800.0000 mg | ORAL_TABLET | Freq: Three times a day (TID) | ORAL | 0 refills | Status: DC

## 2019-04-03 MED ORDER — HYDROCORTISONE 1 % EX LOTN: 1.0000 "application " | TOPICAL_LOTION | Freq: Two times a day (BID) | CUTANEOUS | 0 refills | Status: AC

## 2019-04-03 MED ORDER — LORATADINE 10 MG PO TABS: 10.0000 mg | ORAL_TABLET | Freq: Two times a day (BID) | ORAL | 0 refills | Status: DC | PRN

## 2019-04-03 MED ORDER — MONTELUKAST SODIUM 10 MG PO TABS: 10.0000 mg | ORAL_TABLET | Freq: Every day | ORAL | 3 refills | Status: DC

## 2019-04-03 MED ORDER — IBUPROFEN 200 MG PO TABS
800.0000 mg | ORAL_TABLET | Freq: Once | ORAL | Status: AC
  Administered 2019-04-03: 800 mg via ORAL

## 2019-04-03 MED ORDER — LORATADINE 10 MG PO TABS
10.0000 mg | ORAL_TABLET | Freq: Once | ORAL | Status: AC
  Administered 2019-04-03: 10:00:00 10 mg via ORAL

## 2019-04-03 MED ORDER — METHYLPREDNISOLONE ACETATE 80 MG/ML IJ SUSP
80.0000 mg | Freq: Once | INTRAMUSCULAR | Status: AC
  Administered 2019-04-03: 80 mg via INTRAMUSCULAR

## 2019-04-03 MED ORDER — DIPHENHYDRAMINE HCL 2 % EX GEL: 1.0000 "application " | Freq: Two times a day (BID) | CUTANEOUS | Status: AC | PRN

## 2019-04-03 NOTE — Progress Notes (Signed)
04/03/2019 11:15 AM   Gabriel Moon Apr 27, 1971 [id number]  Referring provider: Corwin Levins, MD [address] [place],  [place] [address]  Chief Complaint  Patient presents with  . Hypogonadism    HPI: 48 y.o. male presents for follow-up of hypogonadism.  He was seen on 12/11/2018 for a second opinion regarding TRT.  At that visit his testosterone level was low at 110.  Symptoms included significant tiredness, fatigue and decreased libido.  He was only receiving 100 mg every 2 weeks.  He elected to switch to [facility] which he has been doing weekly and has noted marked improvement in his symptoms.  Blood work drawn 03/29/2019 remarkable for low normal testosterone at 275, hematocrit 51.6 and PSA 0.6.  He denies bothersome lower urinary tract symptoms, breast tenderness/enlargement or lower extremity edema.   PMH: Past Medical History:  Diagnosis Date  . ANXIETY 01/13/2007  . Arthritis    OSTEO, MIDFOOT  . Chronic LBP 04/19/2011  . Headache   . HYPERLIPIDEMIA 01/13/2007  . Hypertension    LOST WEIGHT, NOT ON MEDS   . NEPHROLITHIASIS, HX OF 04/26/2007  . Rhinitis, allergic   . Testosterone deficiency    MALE HYPOGONADISM    Surgical History: Past Surgical History:  Procedure Laterality Date  . BONE EXOSTOSIS EXCISION Right 11/27/2014   Procedure: PARTIAL EXCISION OF TALUS;  Surgeon: Recardo Evangelist, DPM;  [facility];  Service: Podiatry;  Laterality: Right;    Home Medications:  Allergies as of 04/03/2019      Reactions   Lexapro [escitalopram Oxalate]    BAD REACTION-SUICIDAL      Medication List       Accurate as of April 03, 2019 11:15 AM. If you have any questions, ask your nurse or doctor.        STOP taking these medications   cetirizine 10 MG tablet Commonly known as: ZYRTEC Stopped by: Barbaraann Barthel, NP     TAKE these medications   ALPRAZolam 0.5 MG tablet Commonly known as: XANAX TAKE 1 TABLET(0.5 MG) BY MOUTH  THREE TIMES DAILY AS NEEDED   amLODipine 5 MG tablet Commonly known as: NORVASC Take 1 tablet (5 mg total) by mouth daily. TAKE 1 TABLET(5 MG) BY MOUTH DAILY   azelastine 0.1 % nasal spray Commonly known as: ASTELIN PLACE 1 SPRAY INTO BOTH NOSTRILS 2 (TWO) TIMES DAILY   DIPHENHYDRAMINE HCL (TOPICAL) 2 % Gel Apply 1 application topically 2 (two) times daily as needed for up to 7 days. Started by: Barbaraann Barthel, NP   fluticasone 50 MCG/ACT nasal spray Commonly known as: FLONASE INSTILL 2 SPRAYS IN EACH NOSTRIL ONCE DAILY   hydrochlorothiazide 12.5 MG capsule Commonly known as: Microzide Take 1 capsule (12.5 mg total) by mouth daily.   hydrocortisone 1 % lotion Apply 1 application topically 2 (two) times daily for 7 days. Started by: Barbaraann Barthel, NP   ibuprofen 800 MG tablet Commonly known as: ADVIL Take 1 tablet (800 mg total) by mouth 3 (three) times daily. Started by: Barbaraann Barthel, NP   loratadine 10 MG tablet Commonly known as: CLARITIN Take 1 tablet (10 mg total) by mouth 2 (two) times daily as needed for up to 7 days for allergies. Started by: Barbaraann Barthel, NP   meloxicam 15 MG tablet Commonly known as: MOBIC TK 1 T PO IN THE MORNING WF PRN.   montelukast 10 MG tablet Commonly known as: SINGULAIR Take 1 tablet (10  mg total) by mouth at bedtime. Started by: Olen Cordial, NP   predniSONE 10 MG (21) Tbpk tablet Commonly known as: STERAPRED UNI-PAK 21 TAB Taper take 6 pills by mouth day 1; 5 pills day 2; 4 pills day 3; 3 pills day 4; 2 pills day 5 and 1 pill day 6 with breakfast daily Start taking on: April 05, 2019 Started by: Olen Cordial, NP   Berdine Addison 75 MG/0.5ML Soaj Generic drug: Testosterone Enanthate Inject 75 mg into the skin once a week.       Allergies:  Allergies  Allergen Reactions  . Lexapro [Escitalopram Oxalate]     BAD REACTION-SUICIDAL    Family History: No family history on file.  Social History:   reports that he quit smoking about 16 years ago. His smoking use included cigarettes. He has a 15.00 pack-year smoking history. He has never used smokeless tobacco. He reports current alcohol use of about 2.0 standard drinks of alcohol per week. He reports that he does not use drugs.  ROS: UROLOGY Frequent Urination?: No Hard to postpone urination?: No Burning/pain with urination?: No Get up at night to urinate?: No Leakage of urine?: No Urine stream starts and stops?: No Trouble starting stream?: No Do you have to strain to urinate?: No Blood in urine?: No Urinary tract infection?: No Sexually transmitted disease?: No Injury to kidneys or bladder?: No Painful intercourse?: No Weak stream?: No Erection problems?: No Penile pain?: No  Gastrointestinal Nausea?: No Vomiting?: No Indigestion/heartburn?: No Diarrhea?: No Constipation?: No  Constitutional Fever: No Night sweats?: No Weight loss?: No Fatigue?: No  Skin Skin rash/lesions?: No Itching?: No  Eyes Blurred vision?: No Double vision?: No  Ears/Nose/Throat Sore throat?: No Sinus problems?: No  Hematologic/Lymphatic Swollen glands?: No Easy bruising?: No  Cardiovascular Leg swelling?: No Chest pain?: No  Respiratory Cough?: No Shortness of breath?: No  Endocrine Excessive thirst?: No  Musculoskeletal Back pain?: No Joint pain?: No  Neurological Headaches?: No Dizziness?: No  Psychologic Depression?: No Anxiety?: No  Physical Exam: BP (!) 162/106 (BP Location: Left Arm, Patient Position: Sitting, Cuff Size: Large)   Pulse 83   Ht 5' 8.5" (1.74 m)   Wt 253 lb (114.8 kg)   BMI 37.91 kg/m   Constitutional:  Alert and oriented, No acute distress. HEENT: [place] AT, moist mucus membranes.  Trachea midline, no masses. Cardiovascular: No clubbing, cyanosis, or edema. Respiratory: Normal respiratory effort, no increased work of breathing. GI: Abdomen is soft, nontender, nondistended, no  abdominal masses GU: No CVA tenderness Lymph: No cervical or inguinal lymphadenopathy. Skin: No rashes, bruises or suspicious lesions. Neurologic: Grossly intact, no focal deficits, moving all 4 extremities. Psychiatric: Normal mood and affect.   Assessment & Plan:    - Hypogonadism Marked symptomatic improvement on Xyosted.  His testosterone level is low normal and will increase to 100 mg weekly.  Follow-up 6 months for monitoring blood work.  Will need to monitor hemoglobin and recommend phlebotomy if 54 or higher.   Abbie Sons, [place] [address] [place], [place] [address] [phone]

## 2019-04-03 NOTE — Progress Notes (Signed)
DOI:  04/02/2019 Yesterday was holding mixer box steady & a wasp/hornet stung on underside of right upper arm. Immediate swelling to site - 10 minutes later was golf ball sized.  By the time he went home at the end of day, swelling was the upper right arm & then yesterday evening noticed swelling in the forearm. States no changes since. Itches especially elbow area. Right arm feels tight.  Denies pain. Took Benadry 12:00pm & again at 7:00pm .  AMD

## 2019-04-03 NOTE — Progress Notes (Signed)
Subjective:    Patient ID: Gabriel Moon, male    DOB: 06/01/1971, 48 y.o.   MRN: [id number]  48y/o caucasian male established patient here for evaluation right arm swelling/itching after insect sting yesterday at work.  Tried benadryl 50mg  yesterday x 2 but red streaks and swelling worsening.  Benadryl makes him really drowsy.  Doesn't feel like he can perform work duties today due to arm swelling.  Denied fever/chills/nausea/vomiting/headache/purulent discharge/tongue swelling/wheezing/dyspnea.  Patient would also like to restart his singulair needs refill.     Review of Systems  Constitutional: Negative for activity change, appetite change, chills, diaphoresis, fatigue and fever.  HENT: Positive for congestion and postnasal drip. Negative for dental problem, drooling, ear discharge, ear pain, facial swelling, hearing loss, mouth sores, nosebleeds, trouble swallowing and voice change.   Eyes: Negative for photophobia, pain, discharge, redness, itching and visual disturbance.  Respiratory: Negative for cough, choking, chest tightness, shortness of breath, wheezing and stridor.   Cardiovascular: Negative for chest pain, palpitations and leg swelling.  Gastrointestinal: Negative for abdominal pain, constipation, diarrhea, nausea and vomiting.  Endocrine: Negative for cold intolerance and heat intolerance.  Genitourinary: Negative for difficulty urinating.  Musculoskeletal: Positive for myalgias. Negative for arthralgias, back pain, gait problem, joint swelling, neck pain and neck stiffness.  Skin: Positive for color change and rash. Negative for pallor and wound.  Allergic/Immunologic: Positive for environmental allergies. Negative for food allergies.  Neurological: Negative for dizziness, tremors, seizures, syncope, facial asymmetry, speech difficulty, weakness, light-headedness, numbness and headaches.  Hematological: Negative for adenopathy. Does not bruise/bleed easily.    Psychiatric/Behavioral: Negative for agitation, confusion and sleep disturbance.       Objective:   Physical Exam Vitals signs and nursing note reviewed.  Constitutional:      General: He is awake. He is not in acute distress.    Appearance: Normal appearance. He is well-developed and well-groomed. He is obese. He is not ill-appearing, toxic-appearing or diaphoretic.  HENT:     Head: Normocephalic and atraumatic.     Jaw: There is normal jaw occlusion.     Salivary Glands: Right salivary gland is not diffusely enlarged or tender. Left salivary gland is not diffusely enlarged or tender.     Right Ear: Hearing and external ear normal.     Left Ear: Hearing and external ear normal.     Nose: Nose normal.     Mouth/Throat:     Lips: Pink. No lesions.     Mouth: Mucous membranes are moist.     Tongue: No lesions. Tongue does not deviate from midline.     Palate: No mass and lesions.     Pharynx: Oropharynx is clear. Uvula midline.  Eyes:     General: Lids are normal. Vision grossly intact. Gaze aligned appropriately. No visual field deficit or scleral icterus.       Right eye: No discharge.        Left eye: No discharge.     Extraocular Movements: Extraocular movements intact.     Conjunctiva/sclera: Conjunctivae normal.     Pupils: Pupils are equal, round, and reactive to light.  Neck:     Musculoskeletal: Normal range of motion and neck supple. Normal range of motion. No edema, erythema, neck rigidity, crepitus, injury, pain with movement, torticollis, spinous process tenderness or muscular tenderness.     Thyroid: No thyromegaly or thyroid tenderness.     Trachea: Trachea and phonation normal.  Cardiovascular:     Rate and Rhythm: Normal  rate and regular rhythm.     Pulses:          Radial pulses are 2+ on the right side and 2+ on the left side.     Heart sounds: Normal heart sounds.  Pulmonary:     Effort: Pulmonary effort is normal. No respiratory distress.     Breath  sounds: Normal breath sounds and air entry. No stridor, decreased air movement or transmitted upper airway sounds. No decreased breath sounds, wheezing, rhonchi or rales.     Comments: No cough observed in exam room; spoke full sentences without difficulty; wearing cloth mask due to covid 19 pandemic Abdominal:     Palpations: Abdomen is soft.  Musculoskeletal:     Right shoulder: Normal.     Left shoulder: Normal.     Right elbow: He exhibits decreased range of motion, swelling and effusion. He exhibits no deformity and no laceration. Tenderness found. Medial epicondyle tenderness noted. No radial head, no lateral epicondyle and no olecranon process tenderness noted.     Left elbow: Normal.     Right wrist: Normal.     Left wrist: Normal.     Right hip: Normal.     Left hip: Normal.     Right knee: Normal.     Left knee: Normal.     Cervical back: Normal.     Thoracic back: Normal.     Lumbar back: Normal.     Right upper arm: He exhibits tenderness, swelling and edema. He exhibits no bony tenderness, no deformity and no laceration.     Left upper arm: Normal.     Right forearm: He exhibits tenderness, swelling and edema. He exhibits no bony tenderness, no deformity and no laceration.     Left forearm: Normal.       Arms:     Right hand: Normal.     Left hand: Normal.     Right lower leg: No edema.     Left lower leg: No edema.     Comments: Right elbow AROM limited flexion due to pain and swelling to 45 degrees can full extend to 180 no crepitus no locking no popping with AROM or palpation  Lymphadenopathy:     Head:     Right side of head: No submental, submandibular, tonsillar, preauricular, posterior auricular or occipital adenopathy.     Left side of head: No submental, submandibular, tonsillar, preauricular, posterior auricular or occipital adenopathy.     Cervical: No cervical adenopathy.     Right cervical: No superficial cervical adenopathy.    Left cervical: No  superficial cervical adenopathy.  Skin:    General: Skin is warm and dry.     Capillary Refill: Capillary refill takes less than 2 seconds.     Coloration: Skin is not ashen, cyanotic, jaundiced, mottled, pale or sallow.     Findings: Abrasion, erythema, signs of injury and rash present. No abscess, acne, bruising, burn, ecchymosis, laceration or petechiae. Rash is macular, papular and pustular. Rash is not crusting, nodular, purpuric, scaling, urticarial or vesicular.     Nails: There is no clubbing.           Comments: Right forearm 15 inches left forearm 13.5 inches at maximum diameter; erythema hot to touch anterior medial upper and lower arm starts with 2 pustules/papules noted upper arm proximal to axilla; nummular abrasion noted medial right upper arm 1cm diameter fine scale noted where epithelial layer disrupted  Neurological:     General: No  focal deficit present.     Mental Status: He is alert and oriented to person, place, and time. Mental status is at baseline.     GCS: GCS eye subscore is 4. GCS verbal subscore is 5. GCS motor subscore is 6.     Cranial Nerves: Cranial nerves are intact. No cranial nerve deficit, dysarthria or facial asymmetry.     Sensory: Sensation is intact. No sensory deficit.     Motor: Motor function is intact. No weakness, tremor, atrophy, abnormal muscle tone or seizure activity.     Coordination: Coordination is intact. Coordination normal.     Gait: Gait is intact. Gait normal.     Comments: Gait sure and steady in hallway; on/off exam table without difficulty  Psychiatric:        Attention and Perception: Attention and perception normal.        Mood and Affect: Mood and affect normal.        Speech: Speech normal.        Behavior: Behavior normal. Behavior is cooperative.        Thought Content: Thought content normal.        Cognition and Memory: Cognition and memory normal.        Judgment: Judgment normal.     RN Elna Breslow gave IM  depomedrol 80mg  x 1 right deltoid at 0933 and claritin 10mg  po x1 and motrin 800mg  po x 1.  Patient observed in clinic until 0955 injection site without bleeding/pain, respirations even and unlabored A&Ox3 discharged ambulatory in no apparent distress.      Assessment & Plan:  A-insect sting/bite initial visit, seasonal allergic rhinitis due to pollen  P-depomedrol 80mg  IM x 1 now, motrin 800mg  po x 1 now and claritin 10mg  po x 1 now;  OTC motrin 800mg  po TID x 7 days take with food and stop mobic/meloxicam this week as patient only taking prn moderate to severe foot pain, claritin 10mg  po BID x 7 days.  Benadryl makes him too sleepy needs to drive to another doctor appt.  24 hour work excuse doesn't feel able to perform duties today with arm swelling.  Call RN Raenette Rover tomorrow to update [place] on arm swelling and if not worsened but not improved will extend work excuse another 24 hours.  Patient to start prednisone 10mg  taper with breakfast in 48 hours (60/50/40/30/20/10) #21 RF0 electronic Rx to his pharmacy of choice.  Solitary lesion right upper arm swelling did not encroach axilla but did extend to forearm.  Consider bactrim DS po BID x 7 days #14 RF0 if signs and symptoms infection e.g. fever/chills/purulent discharge/worsening pain/swelling despite plan of care.  May apply benadryl gel topical 2% apply BID prn itching.  Encouraged daily washing with soap and water.  Avoid scratching.  May apply ice 15 minutes po TID prn pain/swelling.  Exitcare handout on insect bite printed and given to patient. RTC if worsening erythema, pain, purulent discharge, fever.  ER if fingers/hand blue/cold discussed uncontrolled swelling could compress blood vessels/nerves in arm to keep elevated above heart level and cryotherapy QID today then prn thereafter.   Wash towels, washcloths, sheets in hot water every couple of days until infection resolved.  Patient verbalized understanding, agreed with plan of care and had no  further questions at this time.   Continue azelastine nasal spray.  Follow up with allergist regarding allergy shots initiation.  Refilled singulair 10mg  po qhs #30 RF3 per patient request electronic Rx to his pharmacy of  choice.  Discussed black box warning with patient and he is to notify us if mood changes/HI/SI/insomnia.   Patient may use normal saline nasal spray 2 sprays each nostril q2h wa as needed. flonase 50mcg 1 spray each nostril BID.  Patient denied personal or family history of ENT cancer.  OTC antihistamine of choice claritin 10mg  po daily.  Avoid triggers if possible.  Shower prior to bedtime if exposed to triggers.  If allergic dust/dust mites recommend mattress/pillow covers/encasements; washing linens, vacuuming, sweeping, dusting weekly.  Call or return to clinic as needed if these symptoms worsen or fail to improve as anticipated.   Exitcare handout on allergic rhinitis and sinus rinse.  Patient verbalized understanding of instructions, agreed with plan of care and had no further questions at this time.  P2:  Avoidance and hand washing.

## 2019-04-03 NOTE — Patient Instructions (Addendum)
Hold meloxicam this week while taking advil 833m by mouth every 8 hours  Bee, Wasp, or [employer] Adult Bees, wasps, and hornets are part of a family of insects that can sting people. These stings can cause pain and inflammation, but they are usually not serious. However, some people may have an allergic reaction to a sting. This can cause the symptoms to be more severe. What increases the risk? You may be at a greater risk of getting stung if you:  Provoke a stinging insect by swatting or disturbing it.  Wear strong-smelling soaps, deodorants, or body sprays.  Spend time outdoors near gardens with flowers or fruit trees or in clothes that expose skin.  Eat or drink outside. What are the signs or symptoms? Common symptoms of this condition include:  A red lump in the skin that sometimes has a tiny hole in the center. In some cases, a stinger may be in the center of the wound.  Pain and itching at the sting site.  Redness and swelling around the sting site. If you have an allergic reaction (localized allergic reaction), the swelling and redness may spread out from the sting site. In some cases, this reaction can continue to develop over the next 24-48 hours. In rare cases, a person may have a severe allergic reaction (anaphylactic reaction) to a sting. Symptoms of an anaphylactic reaction may include:  Wheezing or difficulty breathing.  Raised, itchy, red patches on the skin (hives).  Nausea or vomiting.  Abdominal cramping.  Diarrhea.  Tightness in the chest or chest pain.  Dizziness or fainting.  Redness of the face (flushing).  Hoarse voice.  Swollen tongue, lips, or face. How is this diagnosed? This condition is usually diagnosed based on your symptoms and medical history as well as a physical exam. You may have an allergy test to determine if you are allergic to the substance that the insect injected during the sting (venom). How is this treated? If you were  stung by a bee, the stinger and a small sac of venom may be in the wound. It is important to remove the stinger as soon as possible. You can do this by brushing across the wound with gauze, a fingernail, or a flat card such as a credit card. Removing the stinger can help reduce the severity of your body's reaction to the sting. Most stings can be treated with:  Icing to reduce swelling in the area.  Medicines (antihistamines) to treat itching or an allergic reaction.  Medicines to help reduce pain. These may be medicines that you take by mouth, or medicated creams or lotions that you apply to your skin. Pay close attention to your symptoms after you have been stung. If possible, have someone stay with you to make sure you do not have an allergic reaction. If you have any signs of an allergic reaction, call your health care provider. If you have ever had a severe allergic reaction, your health care provider may give you an inhaler or injectable medicine (epinephrine auto-injector) to use if necessary. Follow these instructions at home:   Wash the sting site 2-3 times each day with soap and water as told by your health care provider.  Apply or take over-the-counter and prescription medicines only as told by your health care provider.  If directed, apply ice to the sting area. ? Put ice in a plastic bag. ? Place a towel between your skin and the bag. ? Leave the ice on for 20  minutes, 2-3 times a day.  Do not scratch the sting area.  If you had a severe allergic reaction to a sting, you may need: ? To wear a medical bracelet or necklace that lists the allergy. ? To learn when and how to use an anaphylaxis kit or epinephrine injection. Your family members and coworkers may also need to learn this. ? To carry an anaphylaxis kit or epinephrine injection with you at all times. How is this prevented?  Avoid swatting at stinging insects and disturbing insect nests.  Do not use fragrant soaps or  lotions.  Wear shoes, pants, and long sleeves when spending time outdoors, especially in grassy areas where stinging insects are common.  Keep outdoor areas free from nests or hives.  Keep food and drink containers covered when eating outdoors.  Avoid working or sitting near [employer], if possible.  Wear gloves if you are gardening or working outdoors.  If an attack by a stinging insect or a swarm seems likely in the moment, move away from the area or find a barrier between you and the insect(s), such as a door. Contact a health care provider if:  Your symptoms do not get better in 2-3 days.  You have redness, swelling, or pain that spreads beyond the area of the sting.  You have a fever. Get help right away if: You have symptoms of a severe allergic reaction. These include:  Wheezing or difficulty breathing.  Tightness in the chest or chest pain.  Light-headedness or fainting.  Itchy, raised, red patches on the skin.  Nausea or vomiting.  Abdominal cramping.  Diarrhea.  A swollen tongue or lips, or trouble swallowing.  Dizziness or fainting. Summary  Stings from bees, wasps, and hornets can cause pain and inflammation, but they are usually not serious. However, some people may have an allergic reaction to a sting. This can cause the symptoms to be more severe.  Pay close attention to your symptoms after you have been stung. If possible, have someone stay with you to make sure you do not have an allergic reaction.  Call your health care provider if you have any signs of an allergic reaction. This information is not intended to replace advice given to you by your health care provider. Make sure you discuss any questions you have with your health care provider. Document Released: 06/06/2005 Document Revised: 06/01/2017 Document Reviewed: 08/11/2016 Elsevier Patient Education  2020 [employer].  How to Perform a Sinus Rinse A sinus rinse is a home treatment  that is used to rinse your sinuses with a sterile mixture of salt and water (saline solution). Sinuses are air-filled spaces in your skull behind the bones of your face and forehead that open into your nasal cavity. A sinus rinse can help to clear mucus, dirt, dust, or pollen from your nasal cavity. You may do a sinus rinse when you have a cold, a virus, nasal allergy symptoms, a sinus infection, or stuffiness in your nose or sinuses. Talk with your health care provider about whether a sinus rinse might help you. What are the risks? A sinus rinse is generally safe and effective. However, there are a few risks, which include:  A burning sensation in your sinuses. This may happen if you do not make the saline solution as directed. Be sure to follow all directions when making the saline solution.  Nasal irritation.  Infection from contaminated water. This is rare, but possible. Do not do a sinus rinse if you  have had ear or nasal surgery, ear infection, or blocked ears. Supplies needed:  Saline solution or powder.  Distilled or sterile water may be needed to mix with saline powder. ? You may use boiled and cooled tap water. Boil tap water for 5 minutes; cool until it is lukewarm. Use within 24 hours. ? Do not use regular tap water to mix with the saline solution.  Neti pot or nasal rinse bottle. These supplies release the saline solution into your nose and through your sinuses. Neti pots and nasal rinse bottles can be purchased at [employer], a health food store, or online. How to perform a sinus rinse  1. Wash your hands with soap and water. 2. Wash your device according to the directions that came with the product and then dry it. 3. Use the solution that comes with your product or one that is sold separately in stores. Follow the mixing directions on the package if you need to mix with sterile or distilled water. 4. Fill the device with the amount of saline solution noted in the  device instructions. 5. Stand over a sink and tilt your head sideways over the sink. 6. Place the spout of the device in your upper nostril (the one closer to the ceiling). 7. Gently pour or squeeze the saline solution into your nasal cavity. The liquid should drain out from the lower nostril if you are not too congested. 8. While rinsing, breathe through your open mouth. 9. Gently blow your nose to clear any mucus and rinse solution. Blowing too hard may cause ear pain. 10. Repeat in your other nostril. 11. Clean and rinse your device with clean water and then air-dry it. Talk with your health care provider or pharmacist if you have questions about how to do a sinus rinse. Summary  A sinus rinse is a home treatment that is used to rinse your sinuses with a sterile mixture of salt and water (saline solution).  A sinus rinse is generally safe and effective. Follow all instructions carefully.  Before doing a sinus rinse, talk with your health care provider about whether it would be helpful for you. This information is not intended to replace advice given to you by your health care provider. Make sure you discuss any questions you have with your health care provider. Document Released: 01/01/2014 Document Revised: 04/03/2017 Document Reviewed: 04/03/2017 Elsevier Patient Education  2020 [employer]. Allergic Rhinitis, Adult Allergic rhinitis is an allergic reaction that affects the mucous membrane inside the nose. It causes sneezing, a runny or stuffy nose, and the feeling of mucus going down the back of the throat (postnasal drip). Allergic rhinitis can be mild to severe. There are two types of allergic rhinitis:  Seasonal. This type is also called hay fever. It happens only during certain seasons.  Perennial. This type can happen at any time of the year. What are the causes? This condition happens when the body's defense system (immune system) responds to certain harmless substances  called allergens as though they were germs.  Seasonal allergic rhinitis is triggered by pollen, which can come from grasses, trees, and weeds. Perennial allergic rhinitis may be caused by:  House dust mites.  Pet dander.  Mold spores. What are the signs or symptoms? Symptoms of this condition include:  Sneezing.  Runny or stuffy nose (nasal congestion).  Postnasal drip.  Itchy nose.  Tearing of the eyes.  Trouble sleeping.  Daytime sleepiness. How is this diagnosed? This condition may be  diagnosed based on:  Your medical history.  A physical exam.  Tests to check for related conditions, such as: ? Asthma. ? Pink eye. ? Ear infection. ? Upper respiratory infection.  Tests to find out which allergens trigger your symptoms. These may include skin or blood tests. How is this treated? There is no cure for this condition, but treatment can help control symptoms. Treatment may include:  Taking medicines that block allergy symptoms, such as antihistamines. Medicine may be given as a shot, nasal spray, or pill.  Avoiding the allergen.  Desensitization. This treatment involves getting ongoing shots until your body becomes less sensitive to the allergen. This treatment may be done if other treatments do not help.  If taking medicine and avoiding the allergen does not work, new, stronger medicines may be prescribed. Follow these instructions at home:  Find out what you are allergic to. Common allergens include smoke, dust, and pollen.  Avoid the things you are allergic to. These are some things you can do to help avoid allergens: ? Replace carpet with wood, tile, or vinyl flooring. Carpet can trap dander and dust. ? Do not smoke. Do not allow smoking in your home. ? Change your heating and air conditioning filter at least once a month. ? During allergy season:  Keep windows closed as much as possible.  Plan outdoor activities when pollen counts are lowest. This is  usually during the evening hours.  When coming indoors, change clothing and shower before sitting on furniture or bedding.  Take over-the-counter and prescription medicines only as told by your health care provider.  Keep all follow-up visits as told by your health care provider. This is important. Contact a health care provider if:  You have a fever.  You develop a persistent cough.  You make whistling sounds when you breathe (you wheeze).  Your symptoms interfere with your normal daily activities. Get help right away if:  You have shortness of breath. Summary  This condition can be managed by taking medicines as directed and avoiding allergens.  Contact your health care provider if you develop a persistent cough or fever.  During allergy season, keep windows closed as much as possible. This information is not intended to replace advice given to you by your health care provider. Make sure you discuss any questions you have with your health care provider. Document Released: 03/01/2001 Document Revised: 05/19/2017 Document Reviewed: 07/14/2016 Elsevier Patient Education  2020 [employer].

## 2019-04-04 ENCOUNTER — Encounter: Payer: Self-pay | Admitting: Urology

## 2019-04-04 ENCOUNTER — Telehealth: Payer: Self-pay

## 2019-04-04 NOTE — Telephone Encounter (Signed)
Reviewed RN Raenette Rover note.  Contacted patient via telephone.  He stated range of motion almost back to normal right elbow.  Yesterday he kept arm elevated and iced QID.  Redness and swelling never went into armpit or to wrist right upper extremity.  Still red from proximal forearm to sting/bite area but less swollen.  Still itchy.  Denied purulent discharge, fever, chills, tingling, numbness, blue fingers/hand, headache, dyspnea.  Patient to continue with plan of care as previously discussed.  Extended work excuse 24 hours.  Patient stated he will walk into clinic tomorrow for RN visit/re-evaluation.  Discussed with patient if swelling/redness/AROM/pain continue to improve as expected I do not anticipate him having any work restrictions Monday 19 Oct and back to full duty.  Patient stated he had taken sick day today.  Discussed with patient I would have RN [employer] fax supervisor work excuse extension.  Patient verbalized understanding information/instructions, agreed with plan of care and had no further questions at this time.

## 2019-04-04 NOTE — Telephone Encounter (Signed)
Work note sent to Librarian, academic at the request of Gerarda Fraction, NP-C.  AMD

## 2019-04-04 NOTE — Telephone Encounter (Signed)
Gabriel Moon called stating you told him to call us today with an update on his right arm - wasp/hornet sting.  Said you needed info to determine if he needs to stay out of work today.  States swelling & redness have gone down some.  Said redness has changed more to a pink.  Still has some swelling & tightness in forearm & underarm.  AMD

## 2019-04-05 ENCOUNTER — Telehealth: Payer: Self-pay | Admitting: Urology

## 2019-04-05 NOTE — Telephone Encounter (Signed)
-----   Message from Abbie Sons, MD sent at 04/04/2019  9:08 PM EDT ----- Since hct was elevated recc a lab visit with HCT in 3 months

## 2019-04-05 NOTE — Telephone Encounter (Signed)
App made and mailed ° °Michelle ° °

## 2019-05-21 ENCOUNTER — Other Ambulatory Visit: Payer: Self-pay

## 2019-05-21 DIAGNOSIS — M19079 Primary osteoarthritis, unspecified ankle and foot: Secondary | ICD-10-CM

## 2019-05-21 MED ORDER — MELOXICAM 15 MG PO TABS: 15.0000 mg | ORAL_TABLET | Freq: Every day | ORAL | 0 refills | Status: DC

## 2019-05-21 NOTE — Telephone Encounter (Signed)
Office visit 04/08/2019 for insect stings with me.  Last PCM appt .  Last exec panel 09/21/2018 ALT slightly elevated 45 otherwise normal renal/liver function.  PMHx polycythemia on testosterone with urology for hypogonadism.  BP elevated previous acute office visit and with urology.  Please have patient come for RN visit BP checks twice over the next month.  Mobic 15mg  po daily prn pain #30 RF0 refill 30 day supply only due to elevated BP urology and [facility] office visits previous 60 days.  Mobic counteracts his blood pressure medication may need to switch NSAID or increase BP med dose if continues above goal 120/70s.

## 2019-05-22 NOTE — Telephone Encounter (Signed)
Sent message to Admin Asst Roseanne Reno) to contact Breylin to come to [facility] 2x this month (December 2020) for Nurse BP checks.  AMD

## 2019-05-29 ENCOUNTER — Ambulatory Visit: Payer: Managed Care, Other (non HMO)

## 2019-05-29 ENCOUNTER — Telehealth: Payer: Self-pay

## 2019-05-29 ENCOUNTER — Other Ambulatory Visit: Payer: Self-pay

## 2019-05-29 VITALS — BP 140/80 | HR 76

## 2019-05-29 DIAGNOSIS — Z0130 Encounter for examination of blood pressure without abnormal findings: Secondary | ICD-10-CM

## 2019-05-29 NOTE — Progress Notes (Signed)
Gerarda Fraction, NP-C (Interim Provider) requested patient come to clinic 2x during the month of December 2020 for BP check. Michela Pitcher he will return to clinic in 2 wks for 2nd check.  AMD

## 2019-05-29 NOTE — Telephone Encounter (Signed)
BP = 140/80 P = 76 O2 = 96  05/22/2019, you refilled Rx for [facility] & requested he come in to office 2x during the month of December for BP check.  States he will return to clinic in 2 wks for 2nd check.  AMD

## 2019-05-29 NOTE — Telephone Encounter (Signed)
noted 

## 2019-05-30 NOTE — Telephone Encounter (Signed)
BP improved from 140/100 Nov 2020 appt.  Awaiting second reading to determine if medication dose needs adjustment as patient had finished prednisone taper due to hornet stings.

## 2019-06-04 ENCOUNTER — Encounter: Payer: Self-pay | Admitting: Internal Medicine

## 2019-06-05 ENCOUNTER — Encounter: Payer: Self-pay | Admitting: Urology

## 2019-06-05 MED ORDER — FLUTICASONE PROPIONATE 50 MCG/ACT NA SUSP: NASAL | 2 refills | Status: DC

## 2019-06-07 ENCOUNTER — Other Ambulatory Visit: Payer: Self-pay

## 2019-06-07 DIAGNOSIS — R1032 Left lower quadrant pain: Secondary | ICD-10-CM | POA: Diagnosis not present

## 2019-06-07 DIAGNOSIS — R310 Gross hematuria: Secondary | ICD-10-CM | POA: Diagnosis not present

## 2019-06-07 DIAGNOSIS — R109 Unspecified abdominal pain: Secondary | ICD-10-CM | POA: Diagnosis not present

## 2019-06-07 DIAGNOSIS — Z87442 Personal history of urinary calculi: Secondary | ICD-10-CM | POA: Diagnosis not present

## 2019-06-07 DIAGNOSIS — R319 Hematuria, unspecified: Secondary | ICD-10-CM | POA: Diagnosis not present

## 2019-06-07 DIAGNOSIS — N200 Calculus of kidney: Secondary | ICD-10-CM | POA: Diagnosis not present

## 2019-06-07 NOTE — Telephone Encounter (Signed)
Pt called back and I spoke with [place] and Dr Bernardo Heater recommends pt go to the ER or acute care.

## 2019-06-12 ENCOUNTER — Ambulatory Visit: Payer: Self-pay

## 2019-06-17 ENCOUNTER — Other Ambulatory Visit: Payer: Self-pay | Admitting: *Deleted

## 2019-06-17 MED ORDER — XYOSTED 100 MG/0.5ML ~~LOC~~ SOAJ: 100.0000 mg | SUBCUTANEOUS | 2 refills | Status: DC

## 2019-06-17 NOTE — Telephone Encounter (Signed)
Patient scheduled to return this week for BP check

## 2019-06-17 NOTE — Progress Notes (Signed)
Error

## 2019-06-19 NOTE — Telephone Encounter (Signed)
noted 

## 2019-06-19 NOTE — Telephone Encounter (Signed)
Roseanne Reno (Admin Asst) spoke with Eddie Dibbles.  He's on vacation this week & unable to come into clinic for a BP check.  States he will come in one day next week when he's back to work.  AMD

## 2019-07-02 ENCOUNTER — Other Ambulatory Visit: Payer: Self-pay | Admitting: *Deleted

## 2019-07-02 DIAGNOSIS — E291 Testicular hypofunction: Secondary | ICD-10-CM

## 2019-07-03 ENCOUNTER — Encounter: Payer: Self-pay | Admitting: Internal Medicine

## 2019-07-03 MED ORDER — ALPRAZOLAM 0.5 MG PO TABS: ORAL_TABLET | ORAL | 2 refills | Status: DC

## 2019-07-08 ENCOUNTER — Other Ambulatory Visit: Payer: Self-pay | Admitting: *Deleted

## 2019-07-08 DIAGNOSIS — E291 Testicular hypofunction: Secondary | ICD-10-CM

## 2019-07-09 ENCOUNTER — Other Ambulatory Visit: Payer: Self-pay

## 2019-07-09 ENCOUNTER — Other Ambulatory Visit: Payer: Managed Care, Other (non HMO)

## 2019-07-09 DIAGNOSIS — E291 Testicular hypofunction: Secondary | ICD-10-CM | POA: Diagnosis not present

## 2019-07-10 ENCOUNTER — Telehealth: Payer: Self-pay | Admitting: Urology

## 2019-07-10 LAB — HEMOGLOBIN AND HEMATOCRIT, BLOOD
Hematocrit: 52.5 % — ABNORMAL HIGH (ref 37.5–51.0)
Hemoglobin: 18.7 g/dL — ABNORMAL HIGH (ref 13.0–17.7)

## 2019-07-10 NOTE — Telephone Encounter (Signed)
Notified patient as instructed, patient pleased. Discussed follow-up appointments, patient agrees  

## 2019-07-10 NOTE — Telephone Encounter (Signed)
Hematocrit was slightly higher at 52.5.  Will recheck at his follow-up in April.  If it gets to 54 we will need to refer to hematology for phlebotomy.  If he is able to donate blood would recommend he do so

## 2019-07-11 ENCOUNTER — Other Ambulatory Visit: Payer: Self-pay | Admitting: Internal Medicine

## 2019-07-11 NOTE — Telephone Encounter (Signed)
Please refill as per office routine med refill policy (all routine meds refilled for 3 mo or monthly per pt preference up to one year from last visit, then month to month grace period for 3 mo, then further med refills will have to be denied)  

## 2019-07-29 NOTE — Telephone Encounter (Signed)
Patient still needs second blood pressure check.  Hasn't returned from what I can see in Epic.

## 2019-07-30 ENCOUNTER — Ambulatory Visit: Payer: Self-pay

## 2019-07-30 ENCOUNTER — Other Ambulatory Visit: Payer: Self-pay

## 2019-07-30 VITALS — BP 140/100

## 2019-07-30 DIAGNOSIS — Z0130 Encounter for examination of blood pressure without abnormal findings: Secondary | ICD-10-CM

## 2019-07-30 NOTE — Progress Notes (Signed)
Patient states he only had robitussin. Notified patient we will recheck at visit next week.

## 2019-07-30 NOTE — Progress Notes (Signed)
Patient comes in today for a nurse blood pressure check. Today his blood pressure is 140/100 in left arm. Patient states he is scheduled to have blood drawn tomorrow to lower his hematocrit.

## 2019-07-30 NOTE — Telephone Encounter (Signed)
Please schedule second BP check

## 2019-07-30 NOTE — Progress Notes (Signed)
Noted patient on robitussin, elevated blood pressure.

## 2019-07-30 NOTE — Progress Notes (Signed)
Please repeat BP at blood draw appt and ask patient if he has had any caffeine/stimulants/nicotine in previous 8 hours.

## 2019-08-07 ENCOUNTER — Ambulatory Visit: Payer: Self-pay | Admitting: Registered Nurse

## 2019-08-07 ENCOUNTER — Other Ambulatory Visit: Payer: Self-pay

## 2019-08-07 VITALS — BP 140/96 | HR 92 | Temp 98.1°F | Ht 69.0 in | Wt 254.6 lb

## 2019-08-07 DIAGNOSIS — D751 Secondary polycythemia: Secondary | ICD-10-CM

## 2019-08-07 DIAGNOSIS — B079 Viral wart, unspecified: Secondary | ICD-10-CM

## 2019-08-07 DIAGNOSIS — E291 Testicular hypofunction: Secondary | ICD-10-CM

## 2019-08-07 DIAGNOSIS — E785 Hyperlipidemia, unspecified: Secondary | ICD-10-CM

## 2019-08-07 DIAGNOSIS — Z6838 Body mass index (BMI) 38.0-38.9, adult: Secondary | ICD-10-CM

## 2019-08-07 DIAGNOSIS — I10 Essential (primary) hypertension: Secondary | ICD-10-CM

## 2019-08-07 MED ORDER — AMLODIPINE BESYLATE 5 MG PO TABS: 10.0000 mg | ORAL_TABLET | Freq: Every day | ORAL | 0 refills | Status: DC

## 2019-08-07 NOTE — Patient Instructions (Addendum)
Managing Your Hypertension Hypertension is commonly called high blood pressure. This is when the force of your blood pressing against the walls of your arteries is too strong. Arteries are blood vessels that carry blood from your heart throughout your body. Hypertension forces the heart to work harder to pump blood, and may cause the arteries to become narrow or stiff. Having untreated or uncontrolled hypertension can cause heart attack, stroke, kidney disease, and other problems. What are blood pressure readings? A blood pressure reading consists of a higher number over a lower number. Ideally, your blood pressure should be below 120/80. The first ("top") number is called the systolic pressure. It is a measure of the pressure in your arteries as your heart beats. The second ("bottom") number is called the diastolic pressure. It is a measure of the pressure in your arteries as the heart relaxes. What does my blood pressure reading mean? Blood pressure is classified into four stages. Based on your blood pressure reading, your health care provider may use the following stages to determine what type of treatment you need, if any. Systolic pressure and diastolic pressure are measured in a unit called mm Hg. Normal  Systolic pressure: below 254.  Diastolic pressure: below 80. Elevated  Systolic pressure: 982-641.  Diastolic pressure: below 80. Hypertension stage 1  Systolic pressure: 583-094.  Diastolic pressure: 07-68. Hypertension stage 2  Systolic pressure: 088 or above.  Diastolic pressure: 90 or above. What health risks are associated with hypertension? Managing your hypertension is an important responsibility. Uncontrolled hypertension can lead to:  A heart attack.  A stroke.  A weakened blood vessel (aneurysm).  Heart failure.  Kidney damage.  Eye damage.  Metabolic syndrome.  Memory and concentration problems. What changes can I make to manage my hypertension?  Hypertension can be managed by making lifestyle changes and possibly by taking medicines. Your health care provider will help you make a plan to bring your blood pressure within a normal range. Eating and drinking   Eat a diet that is high in fiber and potassium, and low in salt (sodium), added sugar, and fat. An example eating plan is called the DASH (Dietary Approaches to Stop Hypertension) diet. To eat this way: ? Eat plenty of fresh fruits and vegetables. Try to fill half of your plate at each meal with fruits and vegetables. ? Eat whole grains, such as whole wheat pasta, brown rice, or whole grain bread. Fill about one quarter of your plate with whole grains. ? Eat low-fat diary products. ? Avoid fatty cuts of meat, processed or cured meats, and poultry with skin. Fill about one quarter of your plate with lean proteins such as fish, chicken without skin, beans, eggs, and tofu. ? Avoid premade and processed foods. These tend to be higher in sodium, added sugar, and fat.  Reduce your daily sodium intake. Most people with hypertension should eat less than 1,500 mg of sodium a day.  Limit alcohol intake to no more than 1 drink a day for nonpregnant women and 2 drinks a day for men. One drink equals 12 oz of beer, 5 oz of wine, or 1 oz of hard liquor. Lifestyle  Work with your health care provider to maintain a healthy body weight, or to lose weight. Ask what an ideal weight is for you.  Get at least 30 minutes of exercise that causes your heart to beat faster (aerobic exercise) most days of the week. Activities may include walking, swimming, or biking.  Include  exercise to strengthen your muscles (resistance exercise), such as weight lifting, as part of your weekly exercise routine. Try to do these types of exercises for 30 minutes at least 3 days a week.  Do not use any products that contain nicotine or tobacco, such as cigarettes and e-cigarettes. If you need help quitting, ask your health  care provider.  Control any long-term (chronic) conditions you have, such as high cholesterol or diabetes. Monitoring  Monitor your blood pressure at home as told by your health care provider. Your personal target blood pressure may vary depending on your medical conditions, your age, and other factors.  Have your blood pressure checked regularly, as often as told by your health care provider. Working with your health care provider  Review all the medicines you take with your health care provider because there may be side effects or interactions.  Talk with your health care provider about your diet, exercise habits, and other lifestyle factors that may be contributing to hypertension.  Visit your health care provider regularly. Your health care provider can help you create and adjust your plan for managing hypertension. Will I need medicine to control my blood pressure? Your health care provider may prescribe medicine if lifestyle changes are not enough to get your blood pressure under control, and if:  Your systolic blood pressure is 130 or higher.  Your diastolic blood pressure is 80 or higher. Take medicines only as told by your health care provider. Follow the directions carefully. Blood pressure medicines must be taken as prescribed. The medicine does not work as well when you skip doses. Skipping doses also puts you at risk for problems. Contact a health care provider if:  You think you are having a reaction to medicines you have taken.  You have repeated (recurrent) headaches.  You feel dizzy.  You have swelling in your ankles.  You have trouble with your vision. Get help right away if:  You develop a severe headache or confusion.  You have unusual weakness or numbness, or you feel faint.  You have severe pain in your chest or abdomen.  You vomit repeatedly.  You have trouble breathing. Summary  Hypertension is when the force of blood pumping through your arteries  is too strong. If this condition is not controlled, it may put you at risk for serious complications.  Your personal target blood pressure may vary depending on your medical conditions, your age, and other factors. For most people, a normal blood pressure is less than 120/80.  Hypertension is managed by lifestyle changes, medicines, or both. Lifestyle changes include weight loss, eating a healthy, low-sodium diet, exercising more, and limiting alcohol. This information is not intended to replace advice given to you by your health care provider. Make sure you discuss any questions you have with your health care provider. Document Revised: 09/28/2018 Document Reviewed: 05/04/2016 Elsevier Patient Education  2020 [employer]   DASH Eating Plan DASH stands for "Dietary Approaches to Stop Hypertension." The DASH eating plan is a healthy eating plan that has been shown to reduce high blood pressure (hypertension). It may also reduce your risk for type 2 diabetes, heart disease, and stroke. The DASH eating plan may also help with weight loss. What are tips for following this plan?  General guidelines  Avoid eating more than 2,300 mg (milligrams) of salt (sodium) a day. If you have hypertension, you may need to reduce your sodium intake to 1,500 mg a day.  Limit alcohol intake to no  more than 1 drink a day for nonpregnant women and 2 drinks a day for men. One drink equals 12 oz of beer, 5 oz of wine, or 1 oz of hard liquor.  Work with your health care provider to maintain a healthy body weight or to lose weight. Ask what an ideal weight is for you.  Get at least 30 minutes of exercise that causes your heart to beat faster (aerobic exercise) most days of the week. Activities may include walking, swimming, or biking.  Work with your health care provider or diet and nutrition specialist (dietitian) to adjust your eating plan to your individual calorie needs. Reading food labels   Check food  labels for the amount of sodium per serving. Choose foods with less than 5 percent of the Daily Value of sodium. Generally, foods with less than 300 mg of sodium per serving fit into this eating plan.  To find whole grains, look for the word "whole" as the first word in the ingredient list. Shopping  Buy products labeled as "low-sodium" or "no salt added."  Buy fresh foods. Avoid canned foods and premade or frozen meals. Cooking  Avoid adding salt when cooking. Use salt-free seasonings or herbs instead of table salt or sea salt. Check with your health care provider or pharmacist before using salt substitutes.  Do not fry foods. Cook foods using healthy methods such as baking, boiling, grilling, and broiling instead.  Cook with heart-healthy oils, such as olive, canola, soybean, or sunflower oil. Meal planning  Eat a balanced diet that includes: ? 5 or more servings of fruits and vegetables each day. At each meal, try to fill half of your plate with fruits and vegetables. ? Up to 6-8 servings of whole grains each day. ? Less than 6 oz of lean meat, poultry, or fish each day. A 3-oz serving of meat is about the same size as a deck of cards. One egg equals 1 oz. ? 2 servings of low-fat dairy each day. ? A serving of nuts, seeds, or beans 5 times each week. ? Heart-healthy fats. Healthy fats called Omega-3 fatty acids are found in foods such as flaxseeds and coldwater fish, like sardines, salmon, and mackerel.  Limit how much you eat of the following: ? Canned or prepackaged foods. ? Food that is high in trans fat, such as fried foods. ? Food that is high in saturated fat, such as fatty meat. ? Sweets, desserts, sugary drinks, and other foods with added sugar. ? Full-fat dairy products.  Do not salt foods before eating.  Try to eat at least 2 vegetarian meals each week.  Eat more home-cooked food and less restaurant, buffet, and fast food.  When eating at a restaurant, ask that your  food be prepared with less salt or no salt, if possible. What foods are recommended? The items listed may not be a complete list. Talk with your dietitian about what dietary choices are best for you. Grains Whole-grain or whole-wheat bread. Whole-grain or whole-wheat pasta. Brown rice. Orpah Cobb. Bulgur. Whole-grain and low-sodium cereals. Pita bread. Low-fat, low-sodium crackers. Whole-wheat flour tortillas. Vegetables Fresh or frozen vegetables (raw, steamed, roasted, or grilled). Low-sodium or reduced-sodium tomato and vegetable juice. Low-sodium or reduced-sodium tomato sauce and tomato paste. Low-sodium or reduced-sodium canned vegetables. Fruits All fresh, dried, or frozen fruit. Canned fruit in natural juice (without added sugar). Meat and other protein foods Skinless chicken or [place]. Ground chicken or [place]. Pork with fat trimmed off. Fish and seafood.  Egg whites. Dried beans, peas, or lentils. Unsalted nuts, nut butters, and seeds. Unsalted canned beans. Lean cuts of beef with fat trimmed off. Low-sodium, lean deli meat. Dairy Low-fat (1%) or fat-free (skim) milk. Fat-free, low-fat, or reduced-fat cheeses. Nonfat, low-sodium ricotta or cottage cheese. Low-fat or nonfat yogurt. Low-fat, low-sodium cheese. Fats and oils Soft margarine without trans fats. Vegetable oil. Low-fat, reduced-fat, or light mayonnaise and salad dressings (reduced-sodium). Canola, safflower, olive, soybean, and sunflower oils. Avocado. Seasoning and other foods Herbs. Spices. Seasoning mixes without salt. Unsalted popcorn and pretzels. Fat-free sweets. What foods are not recommended? The items listed may not be a complete list. Talk with your dietitian about what dietary choices are best for you. Grains Baked goods made with fat, such as croissants, muffins, or some breads. Dry pasta or rice meal packs. Vegetables Creamed or fried vegetables. Vegetables in a cheese sauce. Regular canned vegetables (not  low-sodium or reduced-sodium). Regular canned tomato sauce and paste (not low-sodium or reduced-sodium). Regular tomato and vegetable juice (not low-sodium or reduced-sodium). Rosita Fire. Olives. Fruits Canned fruit in a light or heavy syrup. Fried fruit. Fruit in cream or butter sauce. Meat and other protein foods Fatty cuts of meat. Ribs. Fried meat. Tomasa Blase. Sausage. Bologna and other processed lunch meats. Salami. Fatback. Hotdogs. Bratwurst. Salted nuts and seeds. Canned beans with added salt. Canned or smoked fish. Whole eggs or egg yolks. Chicken or [place] with skin. Dairy Whole or 2% milk, cream, and half-and-half. Whole or full-fat cream cheese. Whole-fat or sweetened yogurt. Full-fat cheese. Nondairy creamers. Whipped toppings. Processed cheese and cheese spreads. Fats and oils Butter. Stick margarine. Lard. Shortening. Ghee. Bacon fat. Tropical oils, such as coconut, palm kernel, or palm oil. Seasoning and other foods Salted popcorn and pretzels. Onion salt, garlic salt, seasoned salt, table salt, and sea salt. Worcestershire sauce. Tartar sauce. Barbecue sauce. Teriyaki sauce. Soy sauce, including reduced-sodium. Steak sauce. Canned and packaged gravies. Fish sauce. Oyster sauce. Cocktail sauce. Horseradish that you find on the shelf. Ketchup. Mustard. Meat flavorings and tenderizers. Bouillon cubes. Hot sauce and Tabasco sauce. Premade or packaged marinades. Premade or packaged taco seasonings. Relishes. Regular salad dressings. Where to find more information:  National Heart, Lung, and Blood Institute: PopSteam.is  American Heart Association: www.heart.org Summary  The DASH eating plan is a healthy eating plan that has been shown to reduce high blood pressure (hypertension). It may also reduce your risk for type 2 diabetes, heart disease, and stroke.  With the DASH eating plan, you should limit salt (sodium) intake to 2,300 mg a day. If you have hypertension, you may need to reduce  your sodium intake to 1,500 mg a day.  When on the DASH eating plan, aim to eat more fresh fruits and vegetables, whole grains, lean proteins, low-fat dairy, and heart-healthy fats.  Work with your health care provider or diet and nutrition specialist (dietitian) to adjust your eating plan to your individual calorie needs. This information is not intended to replace advice given to you by your health care provider. Make sure you discuss any questions you have with your health care provider. Document Revised: 05/19/2017 Document Reviewed: 05/30/2016 Elsevier Patient Education  2020 Else[employer] Inc.  Testosterone Replacement Therapy  Testosterone replacement therapy (TRT) is used to treat men who have a low testosterone level (hypogonadism). Testosterone is a male hormone that is produced in the testicles. It is responsible for typically male characteristics and for maintaining a man's sex drive and the ability to get an erection. Testosterone  also supports bone and muscle health. TRT can be a gel, liquid, or patch that you put on your skin. It can also be in the form of a tablet or an injection. In some cases, your health care provider may insert long-acting pellets under your skin. In most men, the level of testosterone starts to decline gradually after age 3. Low testosterone can also be caused by certain medical conditions, medicines, and obesity. Your health care provider can diagnose hypogonadism with at least two blood tests that are done early in the morning. Low testosterone may not need to be treated. TRT is usually a choice that you make with your health care provider. Your health care provider may recommend TRT if you have low testosterone that is causing symptoms, such as:  Low sex drive.  Erection problems.  Breast enlargement.  Loss of body hair.  Weak muscles or bones.  Shrinking testicles.  Increased body fat.  Low energy.  Hot flashes.  Depression.  Decreased work  International aid/development worker. TRT is a lifetime treatment. If you stop treatment, your testosterone will drop, and your symptoms may return. What are the risks? Testosterone replacement therapy may have side effects, including:  Lower sperm count.  Skin irritation at the application or injection site.  Mouth irritation if you take an oral tablet.  Acne.  Swelling of your legs or feet.  Tender breasts.  Dizziness.  Sleep disturbance.  Mood swings.  Possible increased risk of stroke or heart attack. Testosterone replacement therapy may also increase your risk for prostate cancer or male breast cancer. You should not use TRT if you have either of those conditions. Your health care provider also may not recommend TRT if:  You are suspected of having prostate cancer.  You want to father a child.  You have a high number of red blood cells.  You have untreated sleep apnea.  You have a very large prostate. Supplies needed:  Your health care provider will prescribe the testosterone gel, solution, or medicine that you need. If your health care provider teaches you to do self-injections at home, you will also need: ? Your medicine vial. ? Disposable needles and syringes. ? Alcohol swabs. ? A needle disposal container. ? Adhesive bandages. How to use testosterone replacement therapy Your health care provider will help you find the TRT option that will work best for you based on your preference, the side effects, and the cost. You may:  Rub testosterone gel on your upper arm or shoulder every day after a shower. This is the most common type of TRT. Do not let women or children come in contact with the gel.  Apply a testosterone solution under your arms once each day.  Place a testosterone patch on your skin once each day.  Dissolve a testosterone tablet in your mouth twice each day.  Have a testosterone pellet inserted under your skin by your health care provider. This will be replaced every  3-6 months.  Use testosterone nasal spray three times each day.  Get testosterone injections. For some types of testosterone, your health care provider will give you this injection. With other types of testosterone, you may be taught to give injections to yourself. The frequency of injections may vary based on the type of testosterone that you receive. Follow these instructions at home:  Take over-the-counter and prescription medicines only as told by your health care provider.  Lose weight if you are overweight. Ask your health care provider to help you start a  healthy diet and exercise program to reach and maintain a healthy weight.  Work with your health care provider to treat other medical conditions that may lower your testosterone. These include obesity, high blood pressure, high cholesterol, diabetes, liver disease, kidney disease, and sleep apnea.  Keep all follow-up visits as told by your health care provider. This is important. General recommendations  Discuss all risks and benefits with your health care provider before starting therapy.  Work with your health care provider to check your prostate health and do blood testing before you start therapy.  Do not use any testosterone replacement therapies that are not prescribed by your health care provider or not approved for use in the [place]  Do not use TRT for bodybuilding or to improve sexual performance. TRT should be used only to treat symptoms of low testosterone.  Return for all repeat prostate checks and blood tests during therapy, as told by your health care provider. Where to find more information Learn more about testosterone replacement therapy from:  [facility]: www.urologyhealth.org/urologic-conditions/low-testosterone-(hypogonadism)  Endocrine Society: www.hormone.org/diseases-and-conditions/mens-health/hypogonadism Contact a health care provider if:  You have side effects from your testosterone  replacement therapy.  You continue to have symptoms of low testosterone during treatment.  You develop new symptoms during treatment. Summary  Testosterone replacement therapy is only for men who have low testosterone as determined by blood testing and who have symptoms of low testosterone.  Testosterone replacement therapy should be prescribed only by a health care provider and should be used under the supervision of a health care provider.  You may not be able to take testosterone if you have certain medical conditions, including prostate cancer, male breast cancer, or heart disease.  Testosterone replacement therapy may have side effects and may make some medical conditions worse.  Talk with your health care provider about all the risks and benefits before you start therapy. This information is not intended to replace advice given to you by your health care provider. Make sure you discuss any questions you have with your health care provider. Document Revised: 06/19/2016 Document Reviewed: 02/25/2016 Elsevier Patient Education  2020 [employer].

## 2019-08-07 NOTE — Progress Notes (Signed)
Subjective:    Patient ID: Gabriel Moon, male    DOB: September 17, 1970, 50 y.o.   MRN: [id number]  48y/o established caucasian male here for wart removal right arm and blood pressure follow up.  Taking amlodipine 5mg  po daily and hydrochlorothiazide 12.5mg  po daily.  Denied side effects of either or having problems getting to bathroom during workday.  Walks trails around his home when weather nice.  Small home no garage for workout space.  No access to weights in a space he feels safe in/covid free at this time.  Denied problems with right foot bone spur previously seen by podiatry Dr Elvina Mattes.  Denied any changes with umbilical hernia would like to schedule surgery as has sick time built up now.  Denied any kidney stones symptoms or urinary problems noted in chart history of calcium oxalate.  Seasonal allergies controlled with medication.  Polycythemia due to testosterone therapy for hypogonadism.  Gave blood recently and reported he felt better.  Dr [facility] now managing his testosterone.  Denied back pain flare/worsening weight 256lbs last physical Mar 2020 Dr Roxan Hockey  Cut out alcohol.  Hasn't been exercising used to be Airline pilot and knows how to improve diet and exercise but hasn't been working out due to covid also.  Stuck in rut  Testosterone SQ injections working well for him weekly; donated blood this month.  Checking blood pressure at home not running his 09X diastolic any more.  Trying to do whole foods typically brussel sprouts/cabbage/steak/baked potato his favorite  Urology follow up scheduled in April Dr Bernardo Heater.  Sees Dr Jenny Reichmann as second PCM annually next scheduled physical 10/31/2019.  More convenient for him to be seen for acute needs at [facility] clinic so maintains 2 PCMs.   Review of Systems  Constitutional: Positive for activity change. Negative for appetite change, chills, diaphoresis, fatigue and fever.  HENT: Negative for congestion, dental problem, drooling, ear  discharge, ear pain, facial swelling, hearing loss, mouth sores, nosebleeds, postnasal drip, rhinorrhea, sinus pressure, sinus pain, sneezing, sore throat, tinnitus, trouble swallowing and voice change.   Eyes: Negative for photophobia, pain, discharge, redness, itching and visual disturbance.  Respiratory: Negative for cough, shortness of breath, wheezing and stridor.   Cardiovascular: Negative for chest pain, palpitations and leg swelling.  Gastrointestinal: Negative for abdominal distention, abdominal pain, diarrhea, nausea and vomiting.  Endocrine: Negative for cold intolerance and heat intolerance.  Genitourinary: Negative for difficulty urinating.  Musculoskeletal: Negative for arthralgias, back pain, gait problem, joint swelling, myalgias, neck pain and neck stiffness.  Skin: Positive for rash. Negative for color change, pallor and wound.  Allergic/Immunologic: Positive for environmental allergies. Negative for food allergies.  Neurological: Negative for dizziness, tremors, seizures, syncope, facial asymmetry, speech difficulty, weakness, light-headedness, numbness and headaches.  Hematological: Negative for adenopathy. Does not bruise/bleed easily.  Psychiatric/Behavioral: Negative for agitation, confusion and sleep disturbance.       Objective:   Physical Exam Vitals and nursing note reviewed.  Constitutional:      General: He is awake. He is not in acute distress.    Appearance: Normal appearance. He is well-developed and well-groomed. He is obese. He is not ill-appearing, toxic-appearing or diaphoretic.  HENT:     Head: Normocephalic and atraumatic.     Jaw: There is normal jaw occlusion. No trismus.     Salivary Glands: Right salivary gland is not diffusely enlarged or tender. Left salivary gland is not diffusely enlarged or tender.     Right Ear:  Ear canal and external ear normal. A middle ear effusion is present.     Left Ear: Hearing, ear canal and external ear normal. A  middle ear effusion is present.     Nose: Mucosal edema and rhinorrhea present. No nasal deformity, septal deviation or laceration.     Right Sinus: Maxillary sinus tenderness present. No frontal sinus tenderness.     Left Sinus: Maxillary sinus tenderness present. No frontal sinus tenderness.     Mouth/Throat:     Mouth: Mucous membranes are not pale, not dry and not cyanotic. No lacerations or oral lesions.     Dentition: Normal dentition. Does not have dentures. No dental caries or dental abscesses.     Pharynx: Uvula midline. Posterior oropharyngeal erythema present. No oropharyngeal exudate or uvula swelling.     Tonsils: No tonsillar abscesses.  Eyes:     General: Lids are normal. Gaze aligned appropriately. Allergic shiner present. No visual field deficit or scleral icterus.       Right eye: No foreign body, discharge or hordeolum.        Left eye: No foreign body, discharge or hordeolum.     Extraocular Movements:     Right eye: Normal extraocular motion and no nystagmus.     Left eye: Normal extraocular motion and no nystagmus.     Conjunctiva/sclera: Conjunctivae normal.     Right eye: Right conjunctiva is not injected. No chemosis, exudate or hemorrhage.    Left eye: Left conjunctiva is not injected. No chemosis, exudate or hemorrhage.    Pupils: Pupils are equal, round, and reactive to light. Pupils are equal.     Right eye: Pupil is round and reactive.     Left eye: Pupil is round and reactive.  Neck:     Thyroid: No thyroid mass, thyromegaly or thyroid tenderness.     Trachea: Trachea and phonation normal. No tracheal tenderness or tracheal deviation.  Cardiovascular:     Rate and Rhythm: Normal rate and regular rhythm.     Chest Wall: PMI is not displaced.     Pulses: Normal pulses.          Radial pulses are 2+ on the right side and 2+ on the left side.     Heart sounds: Normal heart sounds, S1 normal and S2 normal. Heart sounds not distant. No murmur. No friction rub.  No gallop.   Pulmonary:     Effort: Pulmonary effort is normal. No respiratory distress.     Breath sounds: Normal breath sounds and air entry. No stridor, decreased air movement or transmitted upper airway sounds. No decreased breath sounds, wheezing, rhonchi or rales.     Comments: Spoke full sentences without difficulty; no cough observed in exam room; wearing cloth mask due to covid 19 pandemic Abdominal:     General: Abdomen is flat. Bowel sounds are normal. There is no distension.     Palpations: Abdomen is soft. There is no shifting dullness, fluid wave, hepatomegaly, splenomegaly or mass.     Tenderness: There is no abdominal tenderness. There is no right CVA tenderness, left CVA tenderness, guarding or rebound. Negative signs include Murphy's sign.     Comments: Dull to percussion x 4 quads; normoactive bowel sounds x 4 quads  Musculoskeletal:        General: No swelling, tenderness, deformity or signs of injury. Normal range of motion.     Right shoulder: Normal.     Left shoulder: Normal.     Right  elbow: Normal.     Left elbow: Normal.     Right hand: Normal.     Left hand: Normal.     Cervical back: Normal, normal range of motion and neck supple. No swelling, edema, deformity, erythema, signs of trauma, lacerations, rigidity, spasms, torticollis, tenderness, bony tenderness or crepitus. No pain with movement, spinous process tenderness or muscular tenderness. Normal range of motion.     Thoracic back: Normal.     Lumbar back: Normal.     Right hip: Normal.     Left hip: Normal.     Right knee: Normal.     Left knee: Normal.     Right lower leg: No edema.     Left lower leg: No edema.  Lymphadenopathy:     Head:     Right side of head: No submental, submandibular, tonsillar, preauricular, posterior auricular or occipital adenopathy.     Left side of head: No submental, submandibular, tonsillar, preauricular, posterior auricular or occipital adenopathy.     Cervical: No  cervical adenopathy.     Right cervical: No superficial, deep or posterior cervical adenopathy.    Left cervical: No superficial, deep or posterior cervical adenopathy.  Skin:    General: Skin is warm and dry.     Capillary Refill: Capillary refill takes less than 2 seconds.     Coloration: Skin is not ashen, cyanotic, jaundiced, mottled, pale or sallow.     Findings: Lesion and rash present. No abrasion, abscess, acne, bruising, burn, ecchymosis, erythema, signs of injury, laceration, petechiae or wound. Rash is papular and scaling. Rash is not crusting, macular, nodular, purpuric, pustular, urticarial or vesicular.     Nails: There is no clubbing.          Comments: 41mm and 13mm anterior medial distal right arm scaly papule dry nonbleeding with punctates visible and proximal to elbow scaly papule dry nonbleeding  Neurological:     General: No focal deficit present.     Mental Status: He is alert and oriented to person, place, and time.     GCS: GCS eye subscore is 4. GCS verbal subscore is 5. GCS motor subscore is 6.     Cranial Nerves: Cranial nerves are intact. No cranial nerve deficit, dysarthria or facial asymmetry.     Sensory: Sensation is intact. No sensory deficit.     Motor: Motor function is intact. No weakness, tremor, atrophy, abnormal muscle tone or seizure activity.     Coordination: Coordination is intact. Coordination normal.     Gait: Gait is intact. Gait normal.     Comments: In/out of chair and on/off exam table without difficulty; gait sure and steady in clinic; bilateral hand grasp/upper and lower extremity strength equal 5/5  Psychiatric:        Attention and Perception: Attention and perception normal.        Mood and Affect: Mood and affect normal.        Speech: Speech normal.        Behavior: Behavior normal. Behavior is cooperative.        Thought Content: Thought content normal.        Cognition and Memory: Cognition and memory normal.        Judgment:  Judgment normal.   -Treated with shaving callous with #10 blade until pinpoint of blood noted and slight tenderness.  Liquid nitrogen application x 3 five seconds each until white frost seen and lasts 3 seconds. Band-Aid and neosporin applied from clinic  stock.  dressing clean dry and intact on ambulatory discharge.  Minimal controlled bleeding noted on pad of bandaid.    Results for Gabriel, Moon (MRN [id number]) as of 08/08/2019 09:49  Ref. Range 03/29/2019 09:48 07/09/2019 10:32  Hemoglobin Latest Ref Range: 13.0 - 17.7 g/dL 54.6 (H) 56.8 (H)  HCT Latest Ref Range: 37.5 - 51.0 % 51.6 (H) 52.5 (H)  H/H June 2020 16.8/47.5 Noted 11/14/2018 dose  Testosterone cypionate 200mg /62ml 0.31ml every other week; phlebotomy 450cc and CBC 4-5 weeks after phlebotomy (noted in paper chart and Epic implemented Jun 2020) H/H Mar 2020 19.8/56.6 phlebotomy scheduled 450cc and decreased testosterone dose to 0.75 considering decrease to 0.5 dose per Dr Dorris Fetch  Order was made to decrease to 0.53ml on 09/10/2018  New order to decrease dose to 0.37ml every other week on 09/24/2018. H/H Mar 2019 18.3/55.5 testosterone 1301 2019 Dr Despina Arias scheduled phlebotomy every 6 weeks 450cc with CBC 2 weeks after phlebotomy  Results for Gabriel, Moon (MRN [id number]) as of 08/08/2019 09:49  Ref. Range 11/07/2018 08:30 11/28/2018 08:30 12/11/2018 10:21 03/29/2019 09:48  Testosterone Latest Ref Range: 264 - 916 ng/dL 749 (L)  449 (L) 675     Results for Gabriel, Moon (MRN [id number]) as of 08/08/2019 09:49  Ref. Range 03/29/2019 09:48  Prostate Specific Ag, Serum Latest Ref Range: 0.0 - 4.0 ng/mL 0.6   Assessment & Plan:  A-essential hypertension, BMI 38, hypogonadism, polycythemia, viral warts right arm, dyslipidemia  P-increase amlodipine 10mg  po daily just recently filled 5mg  will start taking 2 daily and will keep BP log at home x 2 weeks then bring into office for review and BP check with RN Paschal Dopp.  If amlodipine  10mg  working well will send in new Rx for patient.  If BP still not at goal will increase hydrochlorothiazide to 25mg  po daily.  Patient attempting weight loss through diet modification and increased activity also.  Consider DASH diet and exercise program 150 minutes exercise/activity per week.  Discussed testosterone, obesity can worsen blood pressure.   Recommended weight loss/weight maintenance to BMI 20-25. Return to the clinic if any new symptoms/notify clinic staff if visual changes, frequent headache, chest pain or dyspnea on mild or  minimal exertion. Exitcare handout on managing hypertension.  Patient verbalized agreement and understanding of treatment plan and had no further questions at this time. P2: Diet and Exercise specific for HTN  Annual physical/labs due June 2021.  Lost 2 lbs over the past year comparing weights in paper chart to today.  Increase activity; start pedometer on phone  [employer] phone didn't have pedometer automatically installed.  Patient going to pick an application and initiate use.  Discussed for one week see what baseline is then increase 100 steps per day (1000 per week total increase) until he reaches [place] 99357 steps per day over the next year.  Discussed parking at end of parking lot or even on bad weather days walking in house hourly 250 steps.  Patient wants to restart weightlifting but no equipment at home and not planning to join gym during covid.  Encouraged walking/lifting items he already has at home/fill gallon of milk container water/dirt/sand.  Patient verbalized understanding information/instructions, agreed with plan of care and had no further questions at this time.  Discuss blood donations with Dr Lonna Cobb. He has labs pending first week of April for urology.  Just donated blood and feels better "I tend to feel sluggish when H/H on high end" Patient planning on every  3 month donations as phlebotomy previously with Dr Montez Morita every 2-4 months  2018/2019.  2020 wasn't his usual schedule due to covid pandemic/trying to maintain social distancing/clinic closed/decreased appt schedule.  Keep follow up with urology for testosterone maintenance/monitoring.  Patient verbalized understanding information/instructions, agreed with plan of care and had no further questions at this time.    May buy salicylic acid/compound W OTC and apply daily this week or use duct tape apply strip completely occluding affected area and leave on for one week.  Patient reported minimal pain after procedure. Discussed with patient to keep clean and dry and follow up in 7 days for reshave/liquid nitrogen therapy.  Discussed with patient 2-3  treatments may be required to eradicate wart. Patient notified to expect scab at treated area but if erythema, purulent drainage, red streak radiating up hand/arm to return to clinic for reevaluation. Patient agreed with plan of care, verbalized understanding of instructions/information and had no further questions at this time.  Consider statin for dyslipidemia.  Patient wants to try diet and exercise.  Labs due Jun 2021.  Patient verbalized understanding information/instructions, agreed with plan of care and had no further questions at this time.

## 2019-08-08 DIAGNOSIS — Z6838 Body mass index (BMI) 38.0-38.9, adult: Secondary | ICD-10-CM | POA: Insufficient documentation

## 2019-08-08 DIAGNOSIS — B079 Viral wart, unspecified: Secondary | ICD-10-CM | POA: Insufficient documentation

## 2019-08-09 NOTE — Progress Notes (Signed)
Patient had office visit 08/07/2019 see note

## 2019-08-19 ENCOUNTER — Other Ambulatory Visit: Payer: Self-pay

## 2019-08-19 DIAGNOSIS — M19079 Primary osteoarthritis, unspecified ankle and foot: Secondary | ICD-10-CM

## 2019-08-21 MED ORDER — MELOXICAM 15 MG PO TABS: 15.0000 mg | ORAL_TABLET | Freq: Every day | ORAL | 0 refills | Status: DC

## 2019-08-21 NOTE — Telephone Encounter (Signed)
BP elevated office visit 08/07/2019 140/96.  Amlodipine increased to 10mg  po daily from 5mg .  1 month refill for mobic 15mg  po daily #30 RF0 sent to his pharmacy of choice.  Needs BP recheck with RN.  Renal and liver function stable but labs due Jun 2021.  Remind patient to avoid other NSAIDS e.g. motrin/ibuprofen/advil/aleve/naproxen/naprosyn when taking mobic.  May use tylenol 1000mg  po every 6 hours prn pain.  Avoid dehdyration.

## 2019-08-30 ENCOUNTER — Other Ambulatory Visit: Payer: Self-pay | Admitting: Internal Medicine

## 2019-09-24 ENCOUNTER — Other Ambulatory Visit: Payer: Self-pay

## 2019-09-24 DIAGNOSIS — E291 Testicular hypofunction: Secondary | ICD-10-CM

## 2019-09-25 ENCOUNTER — Other Ambulatory Visit: Payer: Self-pay

## 2019-09-25 ENCOUNTER — Other Ambulatory Visit: Payer: 59

## 2019-09-25 DIAGNOSIS — E291 Testicular hypofunction: Secondary | ICD-10-CM | POA: Diagnosis not present

## 2019-09-26 ENCOUNTER — Other Ambulatory Visit: Payer: Self-pay | Admitting: Registered Nurse

## 2019-09-26 ENCOUNTER — Other Ambulatory Visit: Payer: Self-pay | Admitting: Internal Medicine

## 2019-09-26 DIAGNOSIS — M19079 Primary osteoarthritis, unspecified ankle and foot: Secondary | ICD-10-CM

## 2019-09-26 LAB — PSA: Prostate Specific Ag, Serum: 0.7 ng/mL (ref 0.0–4.0)

## 2019-09-26 LAB — TESTOSTERONE: Testosterone: 489 ng/dL (ref 264–916)

## 2019-09-26 LAB — HEMATOCRIT: Hematocrit: 52 % — ABNORMAL HIGH (ref 37.5–51.0)

## 2019-09-26 NOTE — Telephone Encounter (Signed)
Done erx 

## 2019-09-26 NOTE — Telephone Encounter (Signed)
COB pt

## 2019-10-01 ENCOUNTER — Encounter: Payer: Self-pay | Admitting: General Practice

## 2019-10-01 ENCOUNTER — Other Ambulatory Visit: Payer: Self-pay | Admitting: Internal Medicine

## 2019-10-01 NOTE — Telephone Encounter (Signed)
error 

## 2019-10-02 ENCOUNTER — Ambulatory Visit: Payer: 59 | Admitting: Urology

## 2019-10-06 NOTE — Progress Notes (Signed)
10/07/19 10:10 AM   [name] 04/05/1971 [id number]  Referring provider: Corwin Levins, MD [address] [place],  [place] [address]  Chief Complaint  Patient presents with  . Hypogonadism    HPI: DRAYCE TAWIL is a 49 y.o. M who returns today for a 6 month f/u.   -Tried TRT 100 mg q 2 weeks w/o effectiveness in past  -Switched to [employer] weekly w/ relief -No issues with Barbaraann Cao however sore knot recently present at injection sites  -Sore knot appears and resolves -good libido, energy stable  -No bothersome urinary symptoms or breast issues  -Blood work 09/25/19, PSA 0.7, Testosterone 489, Hematocrit 52  PMH: Past Medical History:  Diagnosis Date  . ANXIETY 01/13/2007  . Arthritis    OSTEO, MIDFOOT  . Chronic LBP 04/19/2011  . Headache   . HYPERLIPIDEMIA 01/13/2007  . Hypertension    LOST WEIGHT, NOT ON MEDS   . NEPHROLITHIASIS, HX OF 04/26/2007  . Rhinitis, allergic   . Testosterone deficiency    MALE HYPOGONADISM    Surgical History: Past Surgical History:  Procedure Laterality Date  . BONE EXOSTOSIS EXCISION Right 11/27/2014   Procedure: PARTIAL EXCISION OF TALUS;  Surgeon: Recardo Evangelist, DPM;  Location: [facility];  Service: Podiatry;  Laterality: Right;    Home Medications:  Allergies as of 10/07/2019      Reactions   Lexapro [escitalopram Oxalate]    BAD REACTION-SUICIDAL      Medication List       Accurate as of October 07, 2019 10:10 AM. If you have any questions, ask your nurse or doctor.        ALPRAZolam 0.5 MG tablet Commonly known as: XANAX TAKE 1 TABLET(0.5 MG) BY MOUTH THREE TIMES DAILY AS NEEDED   amLODipine 5 MG tablet Commonly known as: NORVASC Take 1 tablet (5 mg total) by mouth daily. Annual appt due in MAY must see provider for future refills   azelastine 0.1 % nasal spray Commonly known as: ASTELIN PLACE 1 SPRAY INTO BOTH NOSTRILS 2 (TWO) TIMES DAILY   fluticasone 50 MCG/ACT nasal spray Commonly known as:  FLONASE SHAKE LIQUID AND USE 2 SPRAYS IN EACH NOSTRIL EVERY DAY   hydrochlorothiazide 12.5 MG capsule Commonly known as: Microzide Take 1 capsule (12.5 mg total) by mouth daily.   loratadine 10 MG tablet Commonly known as: CLARITIN Take 1 tablet (10 mg total) by mouth 2 (two) times daily as needed for up to 7 days for allergies.   meloxicam 15 MG tablet Commonly known as: MOBIC TAKE 1 TABLET(15 MG) BY MOUTH DAILY   montelukast 10 MG tablet Commonly known as: SINGULAIR Take 1 tablet (10 mg total) by mouth at bedtime.   Xyosted 100 MG/0.5ML Soaj Generic drug: Testosterone Enanthate Inject 100 mg into the skin once a week.       Allergies:  Allergies  Allergen Reactions  . Lexapro [Escitalopram Oxalate]     BAD REACTION-SUICIDAL    Family History: No family history on file.  Social History:  reports that he quit smoking about 17 years ago. His smoking use included cigarettes. He has a 15.00 pack-year smoking history. He has never used smokeless tobacco. He reports current alcohol use of about 2.0 standard drinks of alcohol per week. He reports that he does not use drugs.   Physical Exam: BP (!) 156/94   Pulse 77   Ht 5\' 8"  (1.727 m)   Wt 240 lb (108.9 kg)   BMI  36.49 kg/m   Constitutional:  Alert and oriented, No acute distress. HEENT: [place] AT, moist mucus membranes.  Trachea midline, no masses. Cardiovascular: No clubbing, cyanosis, or edema. Respiratory: Normal respiratory effort, no increased work of breathing. Skin: No rashes, bruises or suspicious lesions. GU: Mild induration at 2 previous injection sites, no ecchymosis or mass present.  Scheduled for physical later this month and states PCP will perform DRE Neurologic: Grossly intact, no focal deficits, moving all 4 extremities. Psychiatric: Normal mood and affect.  Laboratory Data:  Lab Results  Component Value Date   CREATININE 1.00 09/21/2018   Lab Results  Component Value Date   TESTOSTERONE 489  09/25/2019   Assessment & Plan:    1. Hypogonadism  Blood work 09/25/19, PSA 0.7, Testosterone 489, Hematocrit 52, reassuring  Effectively managed with Berdine Addison  DRE w/ PCP next month  Return for lab visit in 6 months for PSA, testosterone, hematocrit and 1 year for MD visit   [facility] [address] [place], [place] [address] [phone]  I, Lucas Mallow, am acting as a scribe for Dr. Nicki Reaper C. Geeta Dworkin,  I have reviewed the above documentation for accuracy and completeness, and I agree with the above.    Abbie Sons, MD

## 2019-10-07 ENCOUNTER — Other Ambulatory Visit: Payer: Self-pay

## 2019-10-07 ENCOUNTER — Encounter: Payer: Self-pay | Admitting: Urology

## 2019-10-07 ENCOUNTER — Ambulatory Visit (INDEPENDENT_AMBULATORY_CARE_PROVIDER_SITE_OTHER): Payer: 59 | Admitting: Urology

## 2019-10-07 VITALS — BP 156/94 | HR 77 | Ht 68.0 in | Wt 240.0 lb

## 2019-10-07 DIAGNOSIS — E291 Testicular hypofunction: Secondary | ICD-10-CM | POA: Diagnosis not present

## 2019-10-31 ENCOUNTER — Encounter: Payer: Self-pay | Admitting: Internal Medicine

## 2019-10-31 ENCOUNTER — Other Ambulatory Visit: Payer: Self-pay | Admitting: Internal Medicine

## 2019-10-31 ENCOUNTER — Ambulatory Visit (INDEPENDENT_AMBULATORY_CARE_PROVIDER_SITE_OTHER): Payer: 59 | Admitting: Internal Medicine

## 2019-10-31 ENCOUNTER — Other Ambulatory Visit: Payer: Self-pay

## 2019-10-31 VITALS — BP 140/96 | HR 76 | Temp 98.7°F | Ht 68.0 in | Wt 260.0 lb

## 2019-10-31 DIAGNOSIS — I10 Essential (primary) hypertension: Secondary | ICD-10-CM | POA: Diagnosis not present

## 2019-10-31 DIAGNOSIS — E559 Vitamin D deficiency, unspecified: Secondary | ICD-10-CM | POA: Diagnosis not present

## 2019-10-31 DIAGNOSIS — E538 Deficiency of other specified B group vitamins: Secondary | ICD-10-CM | POA: Diagnosis not present

## 2019-10-31 DIAGNOSIS — M25562 Pain in left knee: Secondary | ICD-10-CM | POA: Diagnosis not present

## 2019-10-31 DIAGNOSIS — Z0000 Encounter for general adult medical examination without abnormal findings: Secondary | ICD-10-CM | POA: Diagnosis not present

## 2019-10-31 DIAGNOSIS — R739 Hyperglycemia, unspecified: Secondary | ICD-10-CM | POA: Diagnosis not present

## 2019-10-31 DIAGNOSIS — M79671 Pain in right foot: Secondary | ICD-10-CM | POA: Diagnosis not present

## 2019-10-31 DIAGNOSIS — M25561 Pain in right knee: Secondary | ICD-10-CM | POA: Diagnosis not present

## 2019-10-31 DIAGNOSIS — R69 Illness, unspecified: Secondary | ICD-10-CM | POA: Diagnosis not present

## 2019-10-31 DIAGNOSIS — Z114 Encounter for screening for human immunodeficiency virus [HIV]: Secondary | ICD-10-CM

## 2019-10-31 DIAGNOSIS — Z0001 Encounter for general adult medical examination with abnormal findings: Secondary | ICD-10-CM

## 2019-10-31 DIAGNOSIS — M79672 Pain in left foot: Secondary | ICD-10-CM

## 2019-10-31 LAB — URINALYSIS, ROUTINE W REFLEX MICROSCOPIC
Bilirubin Urine: NEGATIVE
Hgb urine dipstick: NEGATIVE
Ketones, ur: NEGATIVE
Leukocytes,Ua: NEGATIVE
Nitrite: NEGATIVE
RBC / HPF: NONE SEEN (ref 0–?)
Specific Gravity, Urine: 1.02 (ref 1.000–1.030)
Total Protein, Urine: NEGATIVE
Urine Glucose: NEGATIVE
Urobilinogen, UA: 0.2 (ref 0.0–1.0)
pH: 7 (ref 5.0–8.0)

## 2019-10-31 LAB — HEPATIC FUNCTION PANEL
ALT: 45 U/L (ref 0–53)
AST: 22 U/L (ref 0–37)
Albumin: 4.2 g/dL (ref 3.5–5.2)
Alkaline Phosphatase: 59 U/L (ref 39–117)
Bilirubin, Direct: 0.1 mg/dL (ref 0.0–0.3)
Total Bilirubin: 0.6 mg/dL (ref 0.2–1.2)
Total Protein: 6.9 g/dL (ref 6.0–8.3)

## 2019-10-31 LAB — TSH: TSH: 1.87 u[IU]/mL (ref 0.35–4.50)

## 2019-10-31 LAB — BASIC METABOLIC PANEL
BUN: 17 mg/dL (ref 6–23)
CO2: 29 mEq/L (ref 19–32)
Calcium: 8.9 mg/dL (ref 8.4–10.5)
Chloride: 101 mEq/L (ref 96–112)
Creatinine, Ser: 0.94 mg/dL (ref 0.40–1.50)
GFR: 85.69 mL/min (ref >60.00)
Glucose, Bld: 103 mg/dL — ABNORMAL HIGH (ref 70–99)
Potassium: 3.6 mEq/L (ref 3.5–5.1)
Sodium: 137 mEq/L (ref 135–145)

## 2019-10-31 LAB — HEMOGLOBIN A1C: Hgb A1c MFr Bld: 5.5 % (ref 4.6–6.5)

## 2019-10-31 LAB — LIPID PANEL
Cholesterol: 204 mg/dL — ABNORMAL HIGH (ref 0–200)
HDL: 30.9 mg/dL — ABNORMAL LOW (ref >39.00)
NonHDL: 173.55
Total CHOL/HDL Ratio: 7
Triglycerides: 233 mg/dL — ABNORMAL HIGH (ref 0.0–149.0)
VLDL: 46.6 mg/dL — ABNORMAL HIGH (ref 0.0–40.0)

## 2019-10-31 LAB — LDL CHOLESTEROL, DIRECT: Direct LDL: 137 mg/dL

## 2019-10-31 LAB — VITAMIN D 25 HYDROXY (VIT D DEFICIENCY, FRACTURES): VITD: 22.73 ng/mL — ABNORMAL LOW (ref 30.00–100.00)

## 2019-10-31 LAB — VITAMIN B12: Vitamin B-12: 325 pg/mL (ref 211–911)

## 2019-10-31 MED ORDER — HYDROCHLOROTHIAZIDE 12.5 MG PO CAPS: 12.5000 mg | ORAL_CAPSULE | Freq: Every day | ORAL | 3 refills | Status: DC

## 2019-10-31 MED ORDER — ATORVASTATIN CALCIUM 20 MG PO TABS: 20.0000 mg | ORAL_TABLET | Freq: Every day | ORAL | 3 refills | Status: DC

## 2019-10-31 MED ORDER — AMLODIPINE BESYLATE 10 MG PO TABS: 10.0000 mg | ORAL_TABLET | Freq: Every day | ORAL | 3 refills | Status: DC

## 2019-10-31 MED ORDER — VITAMIN D (ERGOCALCIFEROL) 1.25 MG (50000 UNIT) PO CAPS: 50000.0000 [IU] | ORAL_CAPSULE | ORAL | 0 refills | Status: DC

## 2019-10-31 MED ORDER — TELMISARTAN 80 MG PO TABS: 80.0000 mg | ORAL_TABLET | Freq: Every day | ORAL | 3 refills | Status: DC

## 2019-10-31 NOTE — Progress Notes (Signed)
Subjective:    Patient ID: Gabriel Moon, male    DOB: 12/24/1970, 49 y.o.   MRN: [id number]  HPI  Here for wellness and f/u;  Overall doing ok;  Pt denies Chest pain, worsening SOB, DOE, wheezing, orthopnea, PND, worsening LE edema, palpitations, dizziness or syncope.  Pt denies neurological change such as new headache, facial or extremity weakness.  Pt denies polydipsia, polyuria, or low sugar symptoms. Pt states overall good compliance with treatment and medications, good tolerability, and has been trying to follow appropriate diet.  Pt denies worsening depressive symptoms, suicidal ideation or panic. No fever, night sweats, wt loss, loss of appetite, or other constitutional symptoms.  Pt states good ability with ADL's, has low fall risk, home safety reviewed and adequate, no other significant changes in hearing or vision, and only occasionally active with exercise. Also with recurring right foot pain and swelling worse with walking; mobic not always working well.  Has been followed per NP for [facility], noted elevated BP so amlodipine increased to 10 mg and hct 12.5 added but no significant improvement. BP at rest at home is usually < 140/90 by his cuff, but after more activity has increased.  Wt has increased though trying to work on diet  BP Readings from Last 3 Encounters:  10/31/19 (!) 140/96  10/07/19 (!) 156/94  08/07/19 (!) 140/96  Note: apr 19 wt was estimate per pt and likely incorrect, so overall has gained maybe 6 lbs since feb.  Has been taking testosterone tx and followed closely, but often gives phlebotomy every 3 mo per urology Wt Readings from Last 3 Encounters:  10/31/19 260 lb (117.9 kg)  10/07/19 240 lb (108.9 kg)  08/07/19 254 lb 9.6 oz (115.5 kg)  Has ongoing OSA with good CPAP use and denies worsening symptoms.   Past Medical History:  Diagnosis Date  . ANXIETY 01/13/2007  . Arthritis    OSTEO, MIDFOOT  . Chronic LBP 04/19/2011  . Headache   .  HYPERLIPIDEMIA 01/13/2007  . Hypertension    LOST WEIGHT, NOT ON MEDS   . NEPHROLITHIASIS, HX OF 04/26/2007  . Rhinitis, allergic   . Testosterone deficiency    MALE HYPOGONADISM   Past Surgical History:  Procedure Laterality Date  . BONE EXOSTOSIS EXCISION Right 11/27/2014   Procedure: PARTIAL EXCISION OF TALUS;  Surgeon: Albertine Patricia, DPM;  Location: [facility];  Service: Podiatry;  Laterality: Right;    reports that he quit smoking about 17 years ago. His smoking use included cigarettes. He has a 15.00 pack-year smoking history. He has never used smokeless tobacco. He reports current alcohol use of about 2.0 standard drinks of alcohol per week. He reports that he does not use drugs. family history is not on file. Allergies  Allergen Reactions  . Lexapro [Escitalopram Oxalate]     BAD REACTION-SUICIDAL   Current Outpatient Medications on File Prior to Visit  Medication Sig Dispense Refill  . ALPRAZolam (XANAX) 0.5 MG tablet TAKE 1 TABLET(0.5 MG) BY MOUTH THREE TIMES DAILY AS NEEDED 90 tablet 1  . azelastine (ASTELIN) 0.1 % nasal spray PLACE 1 SPRAY INTO BOTH NOSTRILS 2 (TWO) TIMES DAILY    . fluticasone (FLONASE) 50 MCG/ACT nasal spray SHAKE LIQUID AND USE 2 SPRAYS IN EACH NOSTRIL EVERY DAY 16 g 2  . meloxicam (MOBIC) 15 MG tablet TAKE 1 TABLET(15 MG) BY MOUTH DAILY 30 tablet 0  . montelukast (SINGULAIR) 10 MG tablet Take 1 tablet (10 mg total) by  mouth at bedtime. 30 tablet 3  . Testosterone Enanthate (XYOSTED) 100 MG/0.5ML SOAJ Inject 100 mg into the skin once a week. 4 pen 2  . hydrochlorothiazide (MICROZIDE) 12.5 MG capsule Take 1 capsule (12.5 mg total) by mouth daily. 90 capsule 3   No current facility-administered medications on file prior to visit.   Review of Systems All otherwise neg per pt     Objective:   Physical Exam BP (!) 140/96 (BP Location: Left Arm, Patient Position: Sitting, Cuff Size: Large)   Pulse 76   Temp 98.7 F (37.1 C) (Oral)   Ht 5\' 8"   (1.727 m)   Wt 260 lb (117.9 kg)   SpO2 97%   BMI 39.53 kg/m  VS noted,  Constitutional: Pt appears in NAD HENT: Head: NCAT.  Right Ear: External ear normal.  Left Ear: External ear normal.  Eyes: . Pupils are equal, round, and reactive to light. Conjunctivae and EOM are normal Nose: without d/c or deformity Neck: Neck supple. Gross normal ROM Cardiovascular: Normal rate and regular rhythm.   Pulmonary/Chest: Effort normal and breath sounds without rales or wheezing.  Abd:  Soft, NT, ND, + BS, no organomegaly Neurological: Pt is alert. At baseline orientation, motor grossly intact Skin: Skin is warm. No rashes, other new lesions, no LE edema Psychiatric: Pt behavior is normal without agitation  All otherwise neg per pt Lab Results  Component Value Date   WBC 8.3 11/28/2018   HGB 18.7 (H) 07/09/2019   HCT 52.0 (H) 09/25/2019   PLT 246 11/28/2018   GLUCOSE 103 (H) 10/31/2019   CHOL 204 (H) 10/31/2019   TRIG 233.0 (H) 10/31/2019   HDL 30.90 (L) 10/31/2019   LDLDIRECT 137.0 10/31/2019   LDLCALC 105 (H) 09/21/2018   ALT 45 10/31/2019   AST 22 10/31/2019   NA 137 10/31/2019   K 3.6 10/31/2019   CL 101 10/31/2019   CREATININE 0.94 10/31/2019   BUN 17 10/31/2019   CO2 29 10/31/2019   TSH 1.87 10/31/2019   PSA 0.78 10/24/2017   HGBA1C 5.5 10/31/2019      Assessment & Plan:

## 2019-10-31 NOTE — Patient Instructions (Signed)
Please take all new medication as prescribed - the micardis at HALF of the 80 mg for 1 week  Please check your BP after 1 wk several times and if still elevated > 140/90, ok to increase the 80 mg per day  Please continue all other medications as before, and refills have been done if requested.  Please have the pharmacy call with any other refills you may need.  Please continue your efforts at being more active, low cholesterol diet, and weight control.  You are otherwise up to date with prevention measures today.  Please keep your appointments with your specialists as you may have planned  Please go to the LAB at the blood drawing area for the tests to be done  You will be contacted by phone if any changes need to be made immediately.  Otherwise, you will receive a letter about your results with an explanation, but please check with MyChart first.  Please remember to sign up for MyChart if you have not done so, as this will be important to you in the future with finding out test results, communicating by private email, and scheduling acute appointments online when needed.  Please make an Appointment to return in 3 months, or sooner if needed

## 2019-10-31 NOTE — Telephone Encounter (Signed)
Please refill as per office routine med refill policy (all routine meds refilled for 3 mo or monthly per pt preference up to one year from last visit, then month to month grace period for 3 mo, then further med refills will have to be denied)  

## 2019-11-01 LAB — HIV ANTIBODY (ROUTINE TESTING W REFLEX): HIV 1&2 Ab, 4th Generation: NONREACTIVE

## 2019-11-01 NOTE — Telephone Encounter (Signed)
PCCs to see pt preference

## 2019-11-02 ENCOUNTER — Encounter: Payer: Self-pay | Admitting: Internal Medicine

## 2019-11-02 DIAGNOSIS — M25561 Pain in right knee: Secondary | ICD-10-CM | POA: Insufficient documentation

## 2019-11-02 DIAGNOSIS — M79671 Pain in right foot: Secondary | ICD-10-CM | POA: Insufficient documentation

## 2019-11-02 NOTE — Assessment & Plan Note (Signed)
stable overall by history and exam, recent data reviewed with pt, and pt to continue medical treatment as before,  to f/u any worsening symptoms or concerns  

## 2019-11-02 NOTE — Assessment & Plan Note (Signed)
?   djd - for sport med referral

## 2019-11-02 NOTE — Assessment & Plan Note (Signed)

## 2019-11-02 NOTE — Assessment & Plan Note (Addendum)
Uncontrolled, to cont amlod 10 and hct 12.5, add micardis 80 mg starting half pill per day then increased to 80 mg for persistent elev bp > 140/90

## 2019-11-02 NOTE — Assessment & Plan Note (Addendum)
For podiatry referral, but pt wants to provide a name for [place] later  I spent 31 minutes in addition to time for CPX wellness examination in preparing to see the patient by review of recent labs, imaging and procedures, obtaining and reviewing separately obtained history, communicating with the patient and family or caregiver, ordering medications, tests or procedures, and documenting clinical information in the EHR including the differential Dx, treatment, and any further evaluation and other management of right foot pain, bialteral knee pain, htn, hyperglycemia

## 2019-11-04 NOTE — Telephone Encounter (Signed)
Ok this is done 

## 2019-11-04 NOTE — Telephone Encounter (Signed)
Can you please place referral? Thanks Lawson Fiscal

## 2019-11-07 ENCOUNTER — Encounter: Payer: Self-pay | Admitting: Internal Medicine

## 2019-11-08 ENCOUNTER — Other Ambulatory Visit: Payer: Self-pay

## 2019-11-08 DIAGNOSIS — M19079 Primary osteoarthritis, unspecified ankle and foot: Secondary | ICD-10-CM

## 2019-11-08 MED ORDER — MELOXICAM 15 MG PO TABS: 15.0000 mg | ORAL_TABLET | Freq: Every day | ORAL | 2 refills | Status: DC

## 2019-11-20 DIAGNOSIS — M19171 Post-traumatic osteoarthritis, right ankle and foot: Secondary | ICD-10-CM | POA: Diagnosis not present

## 2019-11-20 DIAGNOSIS — M79671 Pain in right foot: Secondary | ICD-10-CM | POA: Diagnosis not present

## 2019-11-26 ENCOUNTER — Other Ambulatory Visit: Payer: Self-pay | Admitting: Internal Medicine

## 2019-12-05 ENCOUNTER — Encounter: Payer: Self-pay | Admitting: Internal Medicine

## 2019-12-07 ENCOUNTER — Other Ambulatory Visit: Payer: Self-pay | Admitting: Internal Medicine

## 2019-12-07 MED ORDER — ALPRAZOLAM 0.5 MG PO TABS: ORAL_TABLET | ORAL | 2 refills | Status: DC

## 2019-12-25 ENCOUNTER — Other Ambulatory Visit: Payer: Self-pay | Admitting: *Deleted

## 2019-12-25 DIAGNOSIS — E291 Testicular hypofunction: Secondary | ICD-10-CM

## 2019-12-26 MED ORDER — XYOSTED 100 MG/0.5ML ~~LOC~~ SOAJ: 100.0000 mg | SUBCUTANEOUS | 2 refills | Status: DC

## 2020-01-13 ENCOUNTER — Ambulatory Visit
Admission: RE | Admit: 2020-01-13 | Discharge: 2020-01-13 | Disposition: A | Discharge status: Home / Self Care | Payer: 59 | Attending: Urology | Admitting: Urology

## 2020-01-13 ENCOUNTER — Other Ambulatory Visit: Payer: Self-pay

## 2020-01-13 ENCOUNTER — Ambulatory Visit (INDEPENDENT_AMBULATORY_CARE_PROVIDER_SITE_OTHER): Payer: 59 | Admitting: Urology

## 2020-01-13 ENCOUNTER — Encounter: Payer: Self-pay | Admitting: Urology

## 2020-01-13 ENCOUNTER — Ambulatory Visit
Admission: RE | Admit: 2020-01-13 | Discharge: 2020-01-13 | Disposition: A | Discharge status: Home / Self Care | Payer: 59 | Source: Ambulatory Visit | Attending: Urology | Admitting: Urology

## 2020-01-13 VITALS — BP 144/80 | HR 79 | Ht 68.0 in | Wt 250.0 lb

## 2020-01-13 DIAGNOSIS — N200 Calculus of kidney: Secondary | ICD-10-CM | POA: Diagnosis not present

## 2020-01-13 DIAGNOSIS — Z87442 Personal history of urinary calculi: Secondary | ICD-10-CM

## 2020-01-13 DIAGNOSIS — R109 Unspecified abdominal pain: Secondary | ICD-10-CM

## 2020-01-13 MED ORDER — OXYCODONE-ACETAMINOPHEN 5-325 MG PO TABS: 1.0000 | ORAL_TABLET | Freq: Four times a day (QID) | ORAL | 0 refills | Status: DC | PRN

## 2020-01-13 MED ORDER — TAMSULOSIN HCL 0.4 MG PO CAPS: 0.4000 mg | ORAL_CAPSULE | Freq: Every day | ORAL | 0 refills | Status: DC

## 2020-01-13 NOTE — Progress Notes (Signed)
01/13/2020 1:20 PM   Gabriel Moon 1971-05-20 [id number]  Referring provider: Biagio Borg, MD [address] [place],  [place] [address] Chief Complaint  Patient presents with  . Nephrolithiasis    HPI: Gabriel Moon is a 49 y.o. M who returns today for evaluation of possible nephrolithiasis.  -He has a personal history of stones. -No prior surgical intervention. -He has been having left lower back pain that started 1 week ago. -Back pain radiates to his left hemiscrotum and he feels like he needs to have a bowel movements. -Initial episode he was awakened from his sleep and he described as severe, resolved after 3 hours -Pain has cause some insomnia. -No fevers, chills or nausea.  -KUB today large amount of stool and bowel gas obscuring renal outlines however faint calcifications noted bilaterally; no calcifications seen along the expected course of the mid and proximal ureters; multiple pelvic calcifications present    PMH: Past Medical History:  Diagnosis Date  . ANXIETY 01/13/2007  . Arthritis    OSTEO, MIDFOOT  . Chronic LBP 04/19/2011  . Headache   . HYPERLIPIDEMIA 01/13/2007  . Hypertension    LOST WEIGHT, NOT ON MEDS   . NEPHROLITHIASIS, HX OF 04/26/2007  . Rhinitis, allergic   . Testosterone deficiency    MALE HYPOGONADISM    Surgical History: Past Surgical History:  Procedure Laterality Date  . BONE EXOSTOSIS EXCISION Right 11/27/2014   Procedure: PARTIAL EXCISION OF TALUS;  Surgeon: Albertine Patricia, DPM;  Location: [facility] [facility];  Service: Podiatry;  Laterality: Right;    Home Medications:  Allergies as of 01/13/2020      Reactions   Lexapro [escitalopram Oxalate]    BAD REACTION-SUICIDAL      Medication List       Accurate as of January 13, 2020  1:20 PM. If you have any questions, ask your nurse or doctor.        ALPRAZolam 0.5 MG tablet Commonly known as: XANAX 1 tab by mouth three times daily as needed   amLODipine 10 MG  tablet Commonly known as: NORVASC Take 1 tablet (10 mg total) by mouth daily.   atorvastatin 20 MG tablet Commonly known as: Lipitor Take 1 tablet (20 mg total) by mouth daily.   azelastine 0.1 % nasal spray Commonly known as: ASTELIN PLACE 1 SPRAY INTO BOTH NOSTRILS 2 (TWO) TIMES DAILY   fluticasone 50 MCG/ACT nasal spray Commonly known as: FLONASE SHAKE LIQUID AND USE 2 SPRAYS IN EACH NOSTRIL EVERY DAY   hydrochlorothiazide 12.5 MG capsule Commonly known as: Microzide Take 1 capsule (12.5 mg total) by mouth daily.   hydrochlorothiazide 12.5 MG capsule Commonly known as: Microzide Take 1 capsule (12.5 mg total) by mouth daily.   meloxicam 15 MG tablet Commonly known as: MOBIC Take 1 tablet (15 mg total) by mouth daily.   montelukast 10 MG tablet Commonly known as: SINGULAIR Take 1 tablet (10 mg total) by mouth at bedtime.   telmisartan 80 MG tablet Commonly known as: Micardis Take 1 tablet (80 mg total) by mouth daily.   Vitamin D (Ergocalciferol) 1.25 MG (50000 UNIT) Caps capsule Commonly known as: DRISDOL Take 1 capsule (50,000 Units total) by mouth every 7 (seven) days.   Xyosted 100 MG/0.5ML Soaj Generic drug: Testosterone Enanthate Inject 100 mg into the skin once a week.       Allergies:  Allergies  Allergen Reactions  . Lexapro [Escitalopram Oxalate]     BAD REACTION-SUICIDAL  Family History: No family history on file.  Social History:  reports that he quit smoking about 17 years ago. His smoking use included cigarettes. He has a 15.00 pack-year smoking history. He has never used smokeless tobacco. He reports current alcohol use of about 2.0 standard drinks of alcohol per week. He reports that he does not use drugs.   Physical Exam: BP (!) 144/80   Pulse 79   Ht _0  (1.727 m)   Wt (!) 250 lb (113.4 kg)   BMI 38.01 kg/m   Constitutional:  Alert and oriented, No acute distress. HEENT: [place] AT, moist mucus membranes.  Trachea midline, no  masses. Cardiovascular: No clubbing, cyanosis, or edema. Respiratory: Normal respiratory effort, no increased work of breathing. Skin: No rashes, bruises or suspicious lesions. Neurologic: Grossly intact, no focal deficits, moving all 4 extremities. Psychiatric: Normal mood and affect.  Laboratory Data:  Lab Results  Component Value Date   CREATININE 0.94 10/31/2019    Lab Results  Component Value Date   PSA 0.78 10/24/2017   PSA 0.73 10/20/2016   PSA 0.65 09/22/2015    Lab Results  Component Value Date   TESTOSTERONE 489 09/25/2019     Pertinent Imaging: KUB reviewed as above  Assessment & Plan:    1. Nephrolithiasis  Probable small left ureteral calculus  MET with Rx tamsulosin sent to pharmacy  Rx oxycodone also sent  If stone not passed within 1 week we will proceed with a stone protocol CT and he was directed to call earlier for worsening pain or development of fever  If he does pass a small stone recommend a follow-up renal sounds   [facility] [address], [place], [place] [address] [phone]  I, Selena Batten, am acting as a scribe for Dr. Nicki Reaper C. Kimyatta Lecy,  I have reviewed the above documentation for accuracy and completeness, and I agree with the above.    Abbie Sons, MD

## 2020-01-20 ENCOUNTER — Encounter: Payer: Self-pay | Admitting: Emergency Medicine

## 2020-01-20 ENCOUNTER — Ambulatory Visit: Payer: 59 | Admitting: Emergency Medicine

## 2020-01-20 ENCOUNTER — Other Ambulatory Visit: Payer: Self-pay

## 2020-01-20 VITALS — BP 129/86 | HR 77 | Temp 97.8°F | Resp 16 | Ht 68.0 in | Wt 256.0 lb

## 2020-01-20 DIAGNOSIS — B079 Viral wart, unspecified: Secondary | ICD-10-CM

## 2020-01-20 NOTE — Progress Notes (Signed)
Occupational Health Provider Note       Time seen: 10:06 AM    I have reviewed the vital signs and the nursing notes.  HISTORY   Chief Complaint WART REMOVAL   HPI Gabriel Moon is a 49 y.o. male with a history of anxiety, hyperlipidemia, pretension who presents today for wart removal on his right arm.  Patient has been having the wart caught on different objects.  Denies any other complaints.  Past Medical History:  Diagnosis Date  . ANXIETY 01/13/2007  . Arthritis    OSTEO, MIDFOOT  . Chronic LBP 04/19/2011  . Headache   . HYPERLIPIDEMIA 01/13/2007  . Hypertension    LOST WEIGHT, NOT ON MEDS   . NEPHROLITHIASIS, HX OF 04/26/2007  . Rhinitis, allergic   . Testosterone deficiency    MALE HYPOGONADISM    Past Surgical History:  Procedure Laterality Date  . BONE EXOSTOSIS EXCISION Right 11/27/2014   Procedure: PARTIAL EXCISION OF TALUS;  Surgeon: Recardo Evangelist, DPM;  Location: [facility];  Service: Podiatry;  Laterality: Right;    Allergies Lexapro [escitalopram oxalate]   Review of Systems  Skin: Positive for warts Neurological: Negative for headaches, focal weakness or numbness.  All systems negative/normal/unremarkable except as stated in the HPI  ____________________________________________   PHYSICAL EXAM:  VITAL SIGNS: Vitals:   01/20/20 0945  BP: (!) 129/86  Pulse: 77  Resp: 16  Temp: 97.8 F (36.6 C)  SpO2: 96%    Constitutional: Alert and oriented. Well appearing and in no distress. Musculoskeletal: 2 warts present on the right upper extremity, 1 on the proximal forearm and one on the distal forearm approaching the wrist, both over the volar aspect Neurologic:  Normal speech and language. No gross focal neurologic deficits are appreciated.  Skin: 2 warts are present in the right arm  ____________________________________________   LABS (pertinent positives/negatives)  Recent Results (from the past 2160 hour(s))  VITAMIN D  25 Hydroxy (Vit-D Deficiency, Fractures)     Status: Abnormal   Collection Time: 10/31/19  8:58 AM  Result Value Ref Range   VITD 22.73 (L) 30.00 - 100.00 ng/mL  Vitamin B12     Status: None   Collection Time: 10/31/19  8:58 AM  Result Value Ref Range   Vitamin B-12 325 211 - 911 pg/mL  Urinalysis, Routine w reflex microscopic     Status: None   Collection Time: 10/31/19  8:58 AM  Result Value Ref Range   Color, Urine YELLOW Yellow;Lt. Yellow;Straw;Dark Yellow;Amber;Green;Red;Brown   APPearance CLEAR Clear;Turbid;Slightly Cloudy;Cloudy   Specific Gravity, Urine 1.020 1.000 - 1.030   pH 7.0 5.0 - 8.0   Total Protein, Urine NEGATIVE Negative   Urine Glucose NEGATIVE Negative   Ketones, ur NEGATIVE Negative   Bilirubin Urine NEGATIVE Negative   Hgb urine dipstick NEGATIVE Negative   Urobilinogen, UA 0.2 0.0 - 1.0   Leukocytes,Ua NEGATIVE Negative   Nitrite NEGATIVE Negative   WBC, UA 0-2/hpf 0-2/hpf   RBC / HPF none seen 0-2/hpf  TSH     Status: None   Collection Time: 10/31/19  8:58 AM  Result Value Ref Range   TSH 1.87 0.35 - 4.50 uIU/mL  Hepatic function panel     Status: None   Collection Time: 10/31/19  8:58 AM  Result Value Ref Range   Total Bilirubin 0.6 0.2 - 1.2 mg/dL   Bilirubin, Direct 0.1 0.0 - 0.3 mg/dL   Alkaline Phosphatase 59 39 - 117 U/L   AST 22  0 - 37 U/L   ALT 45 0 - 53 U/L   Total Protein 6.9 6.0 - 8.3 g/dL   Albumin 4.2 3.5 - 5.2 g/dL  Basic metabolic panel     Status: Abnormal   Collection Time: 10/31/19  8:58 AM  Result Value Ref Range   Sodium 137 135 - 145 mEq/L   Potassium 3.6 3.5 - 5.1 mEq/L   Chloride 101 96 - 112 mEq/L   CO2 29 19 - 32 mEq/L   Glucose, Bld 103 (H) 70 - 99 mg/dL   BUN 17 6 - 23 mg/dL   Creatinine, Ser 6.28 0.40 - 1.50 mg/dL   GFR 31.51 >76.16 mL/min   Calcium 8.9 8.4 - 10.5 mg/dL  Lipid panel     Status: Abnormal   Collection Time: 10/31/19  8:58 AM  Result Value Ref Range   Cholesterol 204 (H) 0 - 200 mg/dL    Comment:  ATP III Classification       Desirable:  < 200 mg/dL               Borderline High:  200 - 239 mg/dL          High:  > = 073 mg/dL   Triglycerides 710.6 (H) 0 - 149 mg/dL    Comment: Normal:  <269 mg/dLBorderline High:  150 - 199 mg/dL   HDL 48.54 (L) >62.70 mg/dL   VLDL 35.0 (H) 0.0 - 09.3 mg/dL   Total CHOL/HDL Ratio 7     Comment:                Men          Women1/2 Average Risk     3.4          3.3Average Risk          5.0          4.42X Average Risk          9.6          7.13X Average Risk          15.0          11.0                       NonHDL 173.55     Comment: NOTE:  Non-HDL goal should be 30 mg/dL higher than patient's LDL goal (i.e. LDL goal of < 70 mg/dL, would have non-HDL goal of < 100 mg/dL)  Hemoglobin G1W     Status: None   Collection Time: 10/31/19  8:58 AM  Result Value Ref Range   Hgb A1c MFr Bld 5.5 4.6 - 6.5 %    Comment: Glycemic Control Guidelines for People with Diabetes:Non Diabetic:  <6%Goal of Therapy: <7%Additional Action Suggested:  >8%   HIV Antibody (routine testing w rflx)     Status: None   Collection Time: 10/31/19  8:58 AM  Result Value Ref Range   HIV 1&2 Ab, 4th Generation NON-REACTIVE NON-REACTI    Comment: HIV-1 antigen and HIV-1/HIV-2 antibodies were not detected. There is no laboratory evidence of HIV infection. Marland Kitchen PLEASE NOTE: This information has been disclosed to you from records whose confidentiality may be protected by state law.  If your state requires such protection, then the state law prohibits you from making any further disclosure of the information without the specific written consent of the person to whom it pertains, or as otherwise permitted by law. A general authorization for the  release of medical or other information is NOT sufficient for this purpose. . For additional information please refer to http://education.[employer].com/faq/FAQ106 (This link is being provided for informational/ educational purposes  only.) . Marland Kitchen The performance of this assay has not been clinically validated in patients less than 47 years old. Marland Kitchen   LDL cholesterol, direct     Status: None   Collection Time: 10/31/19  8:58 AM  Result Value Ref Range   Direct LDL 137.0 mg/dL    Comment: Optimal:  <540 mg/dLNear or Above Optimal:  100-129 mg/dLBorderline High:  130-159 mg/dLHigh:  160-189 mg/dLVery High:  >190 mg/dL    ASSESSMENT AND PLAN  Warts   Plan: The patient had presented for warts on the right arm.  We did use cryotherapy to freeze the 2 warts.  We will have him follow-up in a couple weeks for recheck.  Daryel November MD    Note: This note was generated in part or whole with voice recognition software. Voice recognition is usually quite accurate but there are transcription errors that can and very often do occur. I apologize for any typographical errors that were not detected and corrected.

## 2020-01-31 ENCOUNTER — Ambulatory Visit (INDEPENDENT_AMBULATORY_CARE_PROVIDER_SITE_OTHER): Payer: 59 | Admitting: Internal Medicine

## 2020-01-31 ENCOUNTER — Other Ambulatory Visit: Payer: Self-pay

## 2020-01-31 ENCOUNTER — Encounter: Payer: Self-pay | Admitting: Internal Medicine

## 2020-01-31 VITALS — BP 136/100 | HR 82 | Temp 97.9°F | Ht 68.0 in | Wt 256.0 lb

## 2020-01-31 DIAGNOSIS — E559 Vitamin D deficiency, unspecified: Secondary | ICD-10-CM

## 2020-01-31 DIAGNOSIS — E785 Hyperlipidemia, unspecified: Secondary | ICD-10-CM | POA: Diagnosis not present

## 2020-01-31 DIAGNOSIS — R739 Hyperglycemia, unspecified: Secondary | ICD-10-CM | POA: Diagnosis not present

## 2020-01-31 DIAGNOSIS — I10 Essential (primary) hypertension: Secondary | ICD-10-CM

## 2020-01-31 DIAGNOSIS — Z0000 Encounter for general adult medical examination without abnormal findings: Secondary | ICD-10-CM

## 2020-01-31 LAB — BASIC METABOLIC PANEL WITH GFR
BUN: 12 mg/dL (ref 7–25)
CO2: 28 mmol/L (ref 20–32)
Calcium: 9.7 mg/dL (ref 8.6–10.3)
Chloride: 104 mmol/L (ref 98–110)
Creat: 1.02 mg/dL (ref 0.60–1.35)
GFR, Est African American: 100 mL/min/{1.73_m2} (ref >=60)
GFR, Est Non African American: 87 mL/min/{1.73_m2} (ref >=60)
Glucose, Bld: 90 mg/dL (ref 65–99)
Potassium: 3.9 mmol/L (ref 3.5–5.3)
Sodium: 139 mmol/L (ref 135–146)

## 2020-01-31 NOTE — Patient Instructions (Signed)
Please check your BP at home daily for 10 days and let us know the average  Please continue all other medications as before, and refills have been done if requested.  Please have the pharmacy call with any other refills you may need.  Please continue your efforts at being more active, low cholesterol diet, and weight control  Please keep your appointments with your specialists as you may have planned  Please go to the LAB at the blood drawing area for the tests to be done  You will be contacted by phone if any changes need to be made immediately.  Otherwise, you will receive a letter about your results with an explanation, but please check with MyChart first.  Please remember to sign up for MyChart if you have not done so, as this will be important to you in the future with finding out test results, communicating by private email, and scheduling acute appointments online when needed.  Please make an Appointment to return for your 1 year visit, or sooner if needed, with Lab testing by Appointment as well, to be done about 3-5 days before at the FIRST FLOOR Lab (so this is for TWO appointments - please see the scheduling desk as you leave)

## 2020-01-31 NOTE — Progress Notes (Signed)
Subjective:    Patient ID: Gabriel Moon, male    DOB: [birth year], 49 y.o.   MRN: [id number]  HPI  Here to f/u; overall doing ok,  Pt denies chest pain, increasing sob or doe, wheezing, orthopnea, PND, increased LE swelling, palpitations, dizziness or syncope.  Pt denies new neurological symptoms such as new headache, or facial or extremity weakness or numbness.  Pt denies polydipsia, polyuria, or low sugar episode.  Pt states overall good compliance with meds, mostly trying to follow appropriate diet, with wt overall stable,  but little exercise however. Tolerating vit D well Past Medical History:  Diagnosis Date  . ANXIETY 01/13/2007  . Arthritis    OSTEO, MIDFOOT  . Chronic LBP 04/19/2011  . Headache   . HYPERLIPIDEMIA 01/13/2007  . Hypertension    LOST WEIGHT, NOT ON MEDS   . NEPHROLITHIASIS, HX OF 04/26/2007  . Rhinitis, allergic   . Testosterone deficiency    MALE HYPOGONADISM   Past Surgical History:  Procedure Laterality Date  . BONE EXOSTOSIS EXCISION Right 11/27/2014   Procedure: PARTIAL EXCISION OF TALUS;  Surgeon: Recardo Evangelist, DPM;  Location: [facility];  Service: Podiatry;  Laterality: Right;    reports that he quit smoking about 17 years ago. His smoking use included cigarettes. He has a 15.00 pack-year smoking history. He has never used smokeless tobacco. He reports current alcohol use of about 2.0 standard drinks of alcohol per week. He reports that he does not use drugs. family history includes Diabetes in his mother; Migraines in his father; Prostate cancer in his father. Allergies  Allergen Reactions  . Lexapro [Escitalopram Oxalate]     BAD REACTION-SUICIDAL   Current Outpatient Medications on File Prior to Visit  Medication Sig Dispense Refill  . ALPRAZolam (XANAX) 0.5 MG tablet 1 tab by mouth three times daily as needed 90 tablet 2  . amLODipine (NORVASC) 10 MG tablet Take 1 tablet (10 mg total) by mouth daily. 90 tablet 3  . atorvastatin  (LIPITOR) 20 MG tablet Take 1 tablet (20 mg total) by mouth daily. 90 tablet 3  . fluticasone (FLONASE) 50 MCG/ACT nasal spray SHAKE LIQUID AND USE 2 SPRAYS IN EACH NOSTRIL EVERY DAY 16 g 2  . hydrochlorothiazide (MICROZIDE) 12.5 MG capsule Take 1 capsule (12.5 mg total) by mouth daily. 90 capsule 3  . meloxicam (MOBIC) 15 MG tablet Take 1 tablet (15 mg total) by mouth daily. 30 tablet 2  . montelukast (SINGULAIR) 10 MG tablet Take 1 tablet (10 mg total) by mouth at bedtime. 30 tablet 3  . oxyCODONE-acetaminophen (PERCOCET/ROXICET) 5-325 MG tablet Take 1 tablet by mouth every 6 (six) hours as needed for severe pain. 15 tablet 0  . tamsulosin (FLOMAX) 0.4 MG CAPS capsule Take 1 capsule (0.4 mg total) by mouth daily after breakfast. 14 capsule 0  . telmisartan (MICARDIS) 80 MG tablet Take 1 tablet (80 mg total) by mouth daily. 90 tablet 3  . Testosterone Enanthate (XYOSTED) 100 MG/0.5ML SOAJ Inject 100 mg into the skin once a week. 4 pen 2  . hydrochlorothiazide (MICROZIDE) 12.5 MG capsule Take 1 capsule (12.5 mg total) by mouth daily. 90 capsule 3   No current facility-administered medications on file prior to visit.   Review of Systems All otherwise neg per pt     Objective:   Physical Exam BP (!) 136/100 (BP Location: Left Arm, Patient Position: Sitting, Cuff Size: Large)   Pulse 82   Temp 97.9 F (36.6 C) (Oral)  Ht 5\' 8"  (1.727 m)   Wt 256 lb (116.1 kg)   SpO2 97%   BMI 38.92 kg/m  VS noted,  Constitutional: Pt appears in NAD HENT: Head: NCAT.  Right Ear: External ear normal.  Left Ear: External ear normal.  Eyes: . Pupils are equal, round, and reactive to light. Conjunctivae and EOM are normal Nose: without d/c or deformity Neck: Neck supple. Gross normal ROM Cardiovascular: Normal rate and regular rhythm.   Pulmonary/Chest: Effort normal and breath sounds without rales or wheezing.  Abd:  Soft, NT, ND, + BS, no organomegaly Neurological: Pt is alert. At baseline  orientation, motor grossly intact Skin: Skin is warm. No rashes, other new lesions, no LE edema Psychiatric: Pt behavior is normal without agitation  All otherwise neg per pt Lab Results  Component Value Date   WBC 8.3 11/28/2018   HGB 18.7 (H) 07/09/2019   HCT 52.0 (H) 09/25/2019   PLT 246 11/28/2018   GLUCOSE 90 01/31/2020   CHOL 130 01/31/2020   TRIG 138 01/31/2020   HDL 38 (L) 01/31/2020   LDLDIRECT 137.0 10/31/2019   LDLCALC 70 01/31/2020   ALT 42 01/31/2020   AST 19 01/31/2020   NA 139 01/31/2020   K 3.9 01/31/2020   CL 104 01/31/2020   CREATININE 1.02 01/31/2020   BUN 12 01/31/2020   CO2 28 01/31/2020   TSH 1.87 10/31/2019   PSA 0.78 10/24/2017   HGBA1C 5.5 01/31/2020      Assessment & Plan:

## 2020-02-01 ENCOUNTER — Encounter: Payer: Self-pay | Admitting: Internal Medicine

## 2020-02-01 LAB — HEPATIC FUNCTION PANEL
AG Ratio: 1.8 (calc) (ref 1.0–2.5)
ALT: 42 U/L (ref 9–46)
AST: 19 U/L (ref 10–40)
Albumin: 4.6 g/dL (ref 3.6–5.1)
Alkaline phosphatase (APISO): 68 U/L (ref 36–130)
Bilirubin, Direct: 0.1 mg/dL (ref 0.0–0.2)
Globulin: 2.5 g/dL (calc) (ref 1.9–3.7)
Indirect Bilirubin: 0.5 mg/dL (calc) (ref 0.2–1.2)
Total Bilirubin: 0.6 mg/dL (ref 0.2–1.2)
Total Protein: 7.1 g/dL (ref 6.1–8.1)

## 2020-02-01 LAB — VITAMIN D 25 HYDROXY (VIT D DEFICIENCY, FRACTURES): Vit D, 25-Hydroxy: 49 ng/mL (ref 30–100)

## 2020-02-01 LAB — LIPID PANEL
Cholesterol: 130 mg/dL (ref <200)
HDL: 38 mg/dL — ABNORMAL LOW (ref >=40)
LDL Cholesterol (Calc): 70 mg/dL (calc)
Non-HDL Cholesterol (Calc): 92 mg/dL (calc) (ref <130)
Total CHOL/HDL Ratio: 3.4 (calc) (ref <5.0)
Triglycerides: 138 mg/dL (ref <150)

## 2020-02-01 LAB — HEMOGLOBIN A1C
Hgb A1c MFr Bld: 5.5 % of total Hgb (ref <5.7)
Mean Plasma Glucose: 111 (calc)
eAG (mmol/L): 6.2 (calc)

## 2020-02-01 NOTE — Assessment & Plan Note (Signed)
stable overall by history and exam, recent data reviewed with pt, and pt to continue medical treatment as before,  to f/u any worsening symptoms or concerns  

## 2020-02-01 NOTE — Addendum Note (Signed)
Addended by: Corwin Levins on: 02/01/2020 02:24 PM   Modules accepted: Orders

## 2020-02-01 NOTE — Assessment & Plan Note (Addendum)
Cont oral replacement  I spent 31 minutes in preparing to see the patient by review of recent labs, imaging and procedures, obtaining and reviewing separately obtained history, communicating with the patient and family or caregiver, ordering medications, tests or procedures, and documenting clinical information in the EHR including the differential Dx, treatment, and any further evaluation and other management of vit d def, hyperglycemia, htn, hld

## 2020-02-05 ENCOUNTER — Other Ambulatory Visit: Payer: Self-pay | Admitting: Physician Assistant

## 2020-02-05 DIAGNOSIS — M19079 Primary osteoarthritis, unspecified ankle and foot: Secondary | ICD-10-CM

## 2020-02-07 ENCOUNTER — Other Ambulatory Visit: Payer: Self-pay

## 2020-02-07 ENCOUNTER — Encounter: Payer: Self-pay | Admitting: Urology

## 2020-02-07 ENCOUNTER — Telehealth: Payer: Self-pay

## 2020-02-07 ENCOUNTER — Ambulatory Visit (INDEPENDENT_AMBULATORY_CARE_PROVIDER_SITE_OTHER): Payer: 59 | Admitting: Urology

## 2020-02-07 ENCOUNTER — Ambulatory Visit
Admission: RE | Admit: 2020-02-07 | Discharge: 2020-02-07 | Disposition: A | Discharge status: Home / Self Care | Payer: 59 | Source: Ambulatory Visit | Attending: Urology | Admitting: Urology

## 2020-02-07 VITALS — BP 137/79 | HR 80 | Ht 68.0 in | Wt 250.0 lb

## 2020-02-07 DIAGNOSIS — N132 Hydronephrosis with renal and ureteral calculous obstruction: Secondary | ICD-10-CM | POA: Diagnosis not present

## 2020-02-07 DIAGNOSIS — K5730 Diverticulosis of large intestine without perforation or abscess without bleeding: Secondary | ICD-10-CM | POA: Diagnosis not present

## 2020-02-07 DIAGNOSIS — R319 Hematuria, unspecified: Secondary | ICD-10-CM | POA: Diagnosis not present

## 2020-02-07 DIAGNOSIS — R3129 Other microscopic hematuria: Secondary | ICD-10-CM

## 2020-02-07 DIAGNOSIS — Z87442 Personal history of urinary calculi: Secondary | ICD-10-CM | POA: Diagnosis not present

## 2020-02-07 DIAGNOSIS — N23 Unspecified renal colic: Secondary | ICD-10-CM | POA: Diagnosis not present

## 2020-02-07 DIAGNOSIS — M8789 Other osteonecrosis, multiple sites: Secondary | ICD-10-CM | POA: Diagnosis not present

## 2020-02-07 DIAGNOSIS — N201 Calculus of ureter: Secondary | ICD-10-CM

## 2020-02-07 MED ORDER — TAMSULOSIN HCL 0.4 MG PO CAPS: 0.4000 mg | ORAL_CAPSULE | Freq: Every day | ORAL | 0 refills | Status: DC

## 2020-02-07 MED ORDER — OXYCODONE-ACETAMINOPHEN 5-325 MG PO TABS: 1.0000 | ORAL_TABLET | Freq: Four times a day (QID) | ORAL | 0 refills | Status: DC | PRN

## 2020-02-07 NOTE — Telephone Encounter (Signed)
Patient was seen today, states he was supposed to get medication but it was not sent to pharmacy.

## 2020-02-07 NOTE — Progress Notes (Signed)
02/07/2020 3:49 PM   Gabriel Moon March 07, 1971 [id number]  Referring provider: Corwin Levins, MD [address] [place],  [place] [address]  Chief Complaint  Patient presents with  . Hematuria    HPI: 49 y.o. male presents today complaining of persistent and worsening right flank pain   History recurrent stone disease  Seen 01/13/2020 with flank pain  KUB unremarkable  Pain improved though on 8/16 he had severe right flank pain radiating to the right lower quadrant  Pain persisted for 6 hours  No fever, chills  No nausea, vomiting  Urine was darker in appearance prior to pain onset  Since that time pain has been dull  No identifiable precipitating, aggravating or alleviating factors  No longer on tamsulosin   PMH: Past Medical History:  Diagnosis Date  . ANXIETY 01/13/2007  . Arthritis    OSTEO, MIDFOOT  . Chronic LBP 04/19/2011  . Headache   . HYPERLIPIDEMIA 01/13/2007  . Hypertension    LOST WEIGHT, NOT ON MEDS   . NEPHROLITHIASIS, HX OF 04/26/2007  . Rhinitis, allergic   . Testosterone deficiency    MALE HYPOGONADISM    Surgical History: Past Surgical History:  Procedure Laterality Date  . BONE EXOSTOSIS EXCISION Right 11/27/2014   Procedure: PARTIAL EXCISION OF TALUS;  Surgeon: Recardo Evangelist, DPM;  Location: [facility];  Service: Podiatry;  Laterality: Right;    Home Medications:  Allergies as of 02/07/2020      Reactions   Lexapro [escitalopram Oxalate]    BAD REACTION-SUICIDAL      Medication List       Accurate as of February 07, 2020  3:49 PM. If you have any questions, ask your nurse or doctor.        ALPRAZolam 0.5 MG tablet Commonly known as: XANAX 1 tab by mouth three times daily as needed   amLODipine 10 MG tablet Commonly known as: NORVASC Take 1 tablet (10 mg total) by mouth daily.   atorvastatin 20 MG tablet Commonly known as: Lipitor Take 1 tablet (20 mg total) by mouth daily.   fluticasone 50 MCG/ACT  nasal spray Commonly known as: FLONASE SHAKE LIQUID AND USE 2 SPRAYS IN EACH NOSTRIL EVERY DAY   hydrochlorothiazide 12.5 MG capsule Commonly known as: Microzide Take 1 capsule (12.5 mg total) by mouth daily.   meloxicam 15 MG tablet Commonly known as: MOBIC Take 1 tablet (15 mg total) by mouth daily.   montelukast 10 MG tablet Commonly known as: SINGULAIR Take 1 tablet (10 mg total) by mouth at bedtime.   oxyCODONE-acetaminophen 5-325 MG tablet Commonly known as: PERCOCET/ROXICET Take 1 tablet by mouth every 6 (six) hours as needed for severe pain.   tamsulosin 0.4 MG Caps capsule Commonly known as: FLOMAX Take 1 capsule (0.4 mg total) by mouth daily after breakfast.   telmisartan 80 MG tablet Commonly known as: Micardis Take 1 tablet (80 mg total) by mouth daily.   Xyosted 100 MG/0.5ML Soaj Generic drug: Testosterone Enanthate Inject 100 mg into the skin once a week.       Allergies:  Allergies  Allergen Reactions  . Lexapro [Escitalopram Oxalate]     BAD REACTION-SUICIDAL    Family History: Family History  Problem Relation Age of Onset  . Diabetes Mother   . Prostate cancer Father   . Migraines Father     Social History:  reports that he quit smoking about 17 years ago. His smoking use included cigarettes. He has a 15.00  pack-year smoking history. He has never used smokeless tobacco. He reports current alcohol use of about 2.0 standard drinks of alcohol per week. He reports that he does not use drugs.   Physical Exam: BP 137/79   Pulse 80   Ht 5\' 8"  (1.727 m)   Wt 250 lb (113.4 kg)   BMI 38.01 kg/m   Constitutional:  Alert and oriented, No acute distress. HEENT: [place] AT, moist mucus membranes.  Trachea midline, no masses. Cardiovascular: No clubbing, cyanosis, or edema. Respiratory: Normal respiratory effort, no increased work of breathing. Skin: No rashes, bruises or suspicious lesions. Neurologic: Grossly intact, no focal deficits, moving all 4  extremities. Psychiatric: Normal mood and affect.  Laboratory Data:  Urinalysis Dipstick 3+ blood/microscopy >30 RBCs  Pertinent Imaging: Stone protocol CT 02/07/2020 personally reviewed  Assessment & Plan:    1.  Renal colic  Episode severe flank pain earlier this week which he states is similar to prior episodes of renal colic  Recent KUB unremarkable  Renal stone protocol CT was performed today and shows a 4 x 7 mm right proximal ureteral calculus with minimal right hydronephrosis.  There are bilateral, nonobstructing renal calculi.  2.  Right proximal ureteral calculus We discussed various treatment options for urolithiasis including observation with or without medical expulsive therapy, shockwave lithotripsy (SWL), ureteroscopy and laser lithotripsy.  We discussed that management is based on stone size, location, density, patient co-morbidities, and patient preference.   Stones <32mm in size have a >80% spontaneous passage rate. Data surrounding the use of tamsulosin for medical expulsive therapy is controversial, but meta analyses suggests it is most efficacious for distal stones between 5-110mm in size.   SWL has a lower stone free rate in a single procedure, but also a lower complication rate compared to ureteroscopy and avoids a stent and associated stent related symptoms. Possible complications include renal hematoma, steinstrasse, and need for additional treatment.  The calculus was not visualized on recent KUB  Ureteroscopy with laser lithotripsy and stent placement has a higher stone free rate than SWL in a single procedure, however increased complication rate including possible infection, ureteral injury, bleeding, and stent related morbidity. Common stent related symptoms include dysuria, urgency/frequency, and flank pain.  After discussion of the risks and benefits of the above treatment options, the patient would like to proceed with medical expulsive therapy   Rx  tamsulosin and oxycodone/APAP were sent to pharmacy   He was instructed to call or worsening pain or development of fever greater than 101 degrees.  3.  Bilateral nephrolithiasis    9m, MD  [facility] [address], [address] [place], [place] [phone]

## 2020-02-09 ENCOUNTER — Encounter: Payer: Self-pay | Admitting: Urology

## 2020-02-10 LAB — URINALYSIS, COMPLETE
Bilirubin, UA: NEGATIVE
Glucose, UA: NEGATIVE
Ketones, UA: NEGATIVE
Leukocytes,UA: NEGATIVE
Nitrite, UA: NEGATIVE
Protein,UA: NEGATIVE
Specific Gravity, UA: 1.025 (ref 1.005–1.030)
Urobilinogen, Ur: 0.2 mg/dL (ref 0.2–1.0)
pH, UA: 5.5 (ref 5.0–7.5)

## 2020-02-10 LAB — MICROSCOPIC EXAMINATION
Bacteria, UA: NONE SEEN
RBC, Urine: >30 /hpf — AB (ref 0–2)

## 2020-03-06 ENCOUNTER — Other Ambulatory Visit: Payer: Self-pay | Admitting: Internal Medicine

## 2020-03-06 NOTE — Telephone Encounter (Signed)
Done erx 

## 2020-03-10 ENCOUNTER — Other Ambulatory Visit: Payer: Self-pay | Admitting: Registered Nurse

## 2020-03-10 NOTE — Telephone Encounter (Signed)
[facility] pt. Thanks!

## 2020-03-11 DIAGNOSIS — M25462 Effusion, left knee: Secondary | ICD-10-CM | POA: Diagnosis not present

## 2020-03-11 DIAGNOSIS — M170 Bilateral primary osteoarthritis of knee: Secondary | ICD-10-CM | POA: Diagnosis not present

## 2020-03-11 DIAGNOSIS — M25561 Pain in right knee: Secondary | ICD-10-CM | POA: Diagnosis not present

## 2020-03-11 DIAGNOSIS — M25562 Pain in left knee: Secondary | ICD-10-CM | POA: Diagnosis not present

## 2020-03-11 NOTE — Telephone Encounter (Signed)
Please refill as per office routine med refill policy (all routine meds refilled for 3 mo or monthly per pt preference up to one year from last visit, then month to month grace period for 3 mo, then further med refills will have to be denied)  

## 2020-03-30 ENCOUNTER — Other Ambulatory Visit: Payer: Self-pay | Admitting: *Deleted

## 2020-03-30 DIAGNOSIS — E291 Testicular hypofunction: Secondary | ICD-10-CM

## 2020-04-02 ENCOUNTER — Other Ambulatory Visit: Payer: Self-pay

## 2020-04-02 ENCOUNTER — Other Ambulatory Visit: Payer: Self-pay | Admitting: Family Medicine

## 2020-04-02 DIAGNOSIS — E291 Testicular hypofunction: Secondary | ICD-10-CM

## 2020-04-02 MED ORDER — XYOSTED 100 MG/0.5ML ~~LOC~~ SOAJ: 100.0000 mg | SUBCUTANEOUS | 2 refills | Status: DC

## 2020-04-03 ENCOUNTER — Encounter: Payer: Self-pay | Admitting: Physician Assistant

## 2020-04-03 ENCOUNTER — Other Ambulatory Visit: Payer: Self-pay

## 2020-04-03 ENCOUNTER — Ambulatory Visit: Payer: Self-pay | Admitting: Physician Assistant

## 2020-04-03 VITALS — BP 130/80 | HR 85 | Temp 99.2°F | Resp 16 | Ht 68.0 in | Wt 249.0 lb

## 2020-04-03 DIAGNOSIS — H6120 Impacted cerumen, unspecified ear: Secondary | ICD-10-CM

## 2020-04-03 MED ORDER — XYOSTED 100 MG/0.5ML ~~LOC~~ SOAJ: 100.0000 mg | SUBCUTANEOUS | 2 refills | Status: DC

## 2020-04-03 NOTE — Progress Notes (Signed)
Pt presents today for an ear lavage. CL,RMA

## 2020-04-03 NOTE — Addendum Note (Signed)
Addended by: Korban Shearer on: 04/03/2020 12:59 PM   Modules accepted: Orders  

## 2020-04-03 NOTE — Progress Notes (Signed)
° °  Subjective:Cerumen impaction    Patient ID: Gabriel Moon, male    DOB: 1970/07/03, 49 y.o.   MRN: [id number]  HPI Patient presents with decreased hearing and sense of fullness bilateral ears for 4 days.  Patient denies vertigo.  Patient has a history of impactions requiring irrigation.   Review of Systems    BPH, hyperlipidemia, and hypertension. Objective:   Physical Exam No acute distress.  HEENT shows bilateral cerumen impaction.       Assessment & Plan: Cerumen impaction.  Bilateral ear irrigation to remove impaction.

## 2020-04-06 ENCOUNTER — Other Ambulatory Visit: Payer: Self-pay

## 2020-04-06 ENCOUNTER — Other Ambulatory Visit: Payer: 59

## 2020-04-06 DIAGNOSIS — E291 Testicular hypofunction: Secondary | ICD-10-CM | POA: Diagnosis not present

## 2020-04-06 NOTE — Progress Notes (Signed)
Presents for annual hearing screen.  Works for [employer]. 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

## 2020-04-07 ENCOUNTER — Ambulatory Visit: Payer: Self-pay

## 2020-04-07 DIAGNOSIS — Z0110 Encounter for examination of ears and hearing without abnormal findings: Secondary | ICD-10-CM

## 2020-04-07 LAB — HEMATOCRIT: Hematocrit: 49.1 % (ref 37.5–51.0)

## 2020-04-07 LAB — PSA: Prostate Specific Ag, Serum: 0.7 ng/mL (ref 0.0–4.0)

## 2020-04-08 ENCOUNTER — Telehealth: Payer: Self-pay | Admitting: Urology

## 2020-04-08 ENCOUNTER — Encounter: Payer: Self-pay | Admitting: *Deleted

## 2020-04-08 DIAGNOSIS — N200 Calculus of kidney: Secondary | ICD-10-CM

## 2020-04-08 NOTE — Telephone Encounter (Signed)
Called [facility] and added testosterone

## 2020-04-08 NOTE — Telephone Encounter (Signed)
PSA/hematocrit were normal and stable however he should also have had a testosterone level drawn.  Please add to that blood work.  He was also seen August 2021 for stone.  Please check to see if he passed the stone

## 2020-04-08 NOTE — Telephone Encounter (Signed)
He states assume I have. I haven't had any problems or disclosing of my urine. I feel like I passed something. I know, how do I not know if I passed one due to the size of the stone.

## 2020-04-09 LAB — TESTOSTERONE: Testosterone: 409 ng/dL (ref 264–916)

## 2020-04-09 LAB — SPECIMEN STATUS REPORT

## 2020-04-11 NOTE — Telephone Encounter (Signed)
Testosterone level was 409.  Recommend follow-up KUB to see if stone is visualized.

## 2020-04-11 NOTE — Addendum Note (Signed)
Addended by: Irineo Axon C on: 04/11/2020 12:25 PM   Modules accepted: Orders

## 2020-04-13 NOTE — Addendum Note (Signed)
Addended by: Levada Schilling on: 04/13/2020 08:17 AM   Modules accepted: Orders

## 2020-04-13 NOTE — Telephone Encounter (Signed)
Notified patient as instructed, patient pleased. Order in for KUB

## 2020-04-16 ENCOUNTER — Ambulatory Visit
Admission: RE | Admit: 2020-04-16 | Discharge: 2020-04-16 | Disposition: A | Discharge status: Home / Self Care | Payer: 59 | Attending: Urology | Admitting: Urology

## 2020-04-16 ENCOUNTER — Ambulatory Visit
Admission: RE | Admit: 2020-04-16 | Discharge: 2020-04-16 | Disposition: A | Discharge status: Home / Self Care | Payer: 59 | Source: Ambulatory Visit | Attending: Urology | Admitting: Urology

## 2020-04-16 DIAGNOSIS — I878 Other specified disorders of veins: Secondary | ICD-10-CM | POA: Diagnosis not present

## 2020-04-16 DIAGNOSIS — N200 Calculus of kidney: Secondary | ICD-10-CM | POA: Diagnosis not present

## 2020-04-19 ENCOUNTER — Telehealth: Payer: Self-pay | Admitting: Urology

## 2020-04-19 DIAGNOSIS — N200 Calculus of kidney: Secondary | ICD-10-CM

## 2020-04-19 NOTE — Telephone Encounter (Signed)
The right ureteral calculus was not seen on KUB however was not visualized on the KUB prior to his CT.  It most likely has passed however would recommend scheduling a renal ultrasound to document resolution of his right kidney blockage.  Order was entered and will call with results.

## 2020-04-20 ENCOUNTER — Encounter: Payer: Self-pay | Admitting: *Deleted

## 2020-04-20 NOTE — Telephone Encounter (Signed)
Send mychart message

## 2020-04-21 DIAGNOSIS — M170 Bilateral primary osteoarthritis of knee: Secondary | ICD-10-CM | POA: Diagnosis not present

## 2020-05-04 ENCOUNTER — Other Ambulatory Visit: Payer: Self-pay | Admitting: *Deleted

## 2020-05-04 DIAGNOSIS — E291 Testicular hypofunction: Secondary | ICD-10-CM

## 2020-05-04 MED ORDER — XYOSTED 100 MG/0.5ML ~~LOC~~ SOAJ: 100.0000 mg | SUBCUTANEOUS | 2 refills | Status: DC

## 2020-05-05 DIAGNOSIS — G4733 Obstructive sleep apnea (adult) (pediatric): Secondary | ICD-10-CM | POA: Diagnosis not present

## 2020-05-18 ENCOUNTER — Telehealth: Payer: Self-pay

## 2020-05-18 DIAGNOSIS — E291 Testicular hypofunction: Secondary | ICD-10-CM

## 2020-05-18 NOTE — Telephone Encounter (Signed)
[facility] LM on triage line stating that the pt is expecting to receive RX from them but they have not received it. It appears that Gabriel Moon was sent to [facility] and not [facility]. Ok to have RX changed and sent there? Pharmacy updated in chart.

## 2020-05-19 ENCOUNTER — Other Ambulatory Visit: Payer: Self-pay | Admitting: *Deleted

## 2020-05-19 DIAGNOSIS — E291 Testicular hypofunction: Secondary | ICD-10-CM

## 2020-05-20 MED ORDER — XYOSTED 100 MG/0.5ML ~~LOC~~ SOAJ: 100.0000 mg | SUBCUTANEOUS | 2 refills | Status: DC

## 2020-05-24 ENCOUNTER — Other Ambulatory Visit: Payer: Self-pay | Admitting: Internal Medicine

## 2020-05-24 NOTE — Telephone Encounter (Signed)
Please refill as per office routine med refill policy (all routine meds refilled for 3 mo or monthly per pt preference up to one year from last visit, then month to month grace period for 3 mo, then further med refills will have to be denied)  

## 2020-05-27 DIAGNOSIS — M179 Osteoarthritis of knee, unspecified: Secondary | ICD-10-CM | POA: Diagnosis not present

## 2020-05-27 DIAGNOSIS — M170 Bilateral primary osteoarthritis of knee: Secondary | ICD-10-CM | POA: Diagnosis not present

## 2020-06-02 ENCOUNTER — Other Ambulatory Visit: Payer: Self-pay

## 2020-06-02 DIAGNOSIS — Z1152 Encounter for screening for COVID-19: Secondary | ICD-10-CM

## 2020-06-02 NOTE — Progress Notes (Addendum)
Presents to [facility] for outside collection of specimen for covid screening.  S/Sx started Thursday (05/28/20): Loss of taste & smell, head & nasal congestion, H/A first couple of days, minor body aches  Exposed to a co-worker is tested positive & is OOW on 14 day quarantine.  Home Covid test positive yesterday  Non-vaccinated  Has Mychart  AMD

## 2020-06-03 LAB — NOVEL CORONAVIRUS, NAA: SARS-CoV-2, NAA: DETECTED — AB

## 2020-06-03 LAB — SARS-COV-2, NAA 2 DAY TAT

## 2020-06-04 ENCOUNTER — Telehealth: Payer: Self-pay | Admitting: Family

## 2020-06-04 ENCOUNTER — Other Ambulatory Visit: Payer: Self-pay | Admitting: Nurse Practitioner

## 2020-06-04 NOTE — Progress Notes (Signed)
Spoke with patient on the phone regarding positive COVID test.   He has been having mostly nasal congestion and loss of taste and smell  Has not been vaccinated  Has had pneumonia in the past   Discussed monoclonal antibody infusion and will send referral through [facility], gave patient [facility] patient hotline. He will consider.  Otherwise advised to seek medical attention for any acutely worsening symptoms  Past Medical History:  Diagnosis Date  . ANXIETY 01/13/2007  . Arthritis    OSTEO, MIDFOOT  . Chronic LBP 04/19/2011  . Headache   . HYPERLIPIDEMIA 01/13/2007  . Hypertension    LOST WEIGHT, NOT ON MEDS   . NEPHROLITHIASIS, HX OF 04/26/2007  . Rhinitis, allergic   . Testosterone deficiency    MALE HYPOGONADISM    Recent Results (from the past 2160 hour(s))  Hematocrit     Status: None   Collection Time: 04/06/20  9:10 AM  Result Value Ref Range   Hematocrit 49.1 37.5 - 51.0 %  PSA     Status: None   Collection Time: 04/06/20  9:10 AM  Result Value Ref Range   Prostate Specific Ag, Serum 0.7 0.0 - 4.0 ng/mL    Comment: Roche ECLIA methodology. According to the American Urological Association, Serum PSA should decrease and remain at undetectable levels after radical prostatectomy. The AUA defines biochemical recurrence as an initial PSA value 0.2 ng/mL or greater followed by a subsequent confirmatory PSA value 0.2 ng/mL or greater. Values obtained with different assay methods or kits cannot be used interchangeably. Results cannot be interpreted as absolute evidence of the presence or absence of malignant disease.   Testosterone     Status: None   Collection Time: 04/06/20  9:10 AM  Result Value Ref Range   Testosterone 409 264 - 916 ng/dL    Comment: Adult male reference interval is based on a population of healthy nonobese males (BMI <30) between 61 and 20 years old. Travison, et.al. JCEM [id number]. PMID: [id number].   Specimen status report     Status: None    Collection Time: 04/06/20  9:10 AM  Result Value Ref Range   specimen status report Comment     Comment: Written Authorization Written Authorization Written Authorization Received. Authorization received from [employer] 04-09-2020 Logged by Adria Dill   Novel Coronavirus, NAA ([facility])     Status: Abnormal   Collection Time: 06/02/20 12:00 AM   Specimen: Nasopharyngeal(NP) swabs in vial transport medium   Nasopharynge  Result Value Ref Range   SARS-CoV-2, NAA Detected (A) Not Detected    Comment: Patients who have a positive COVID-19 test result may now have treatment options. Treatment options are available for patients with mild to moderate symptoms and for hospitalized patients. Visit our website at [email or web address] for resources and information. This nucleic acid amplification test was developed and its performance characteristics determined by [employer]. Nucleic acid amplification tests include RT-PCR and TMA. This test has not been FDA cleared or approved. This test has been authorized by FDA under an Emergency Use Authorization (EUA). This test is only authorized for the duration of time the declaration that circumstances exist justifying the authorization of the emergency use of in vitro diagnostic tests for detection of SARS-CoV-2 virus and/or diagnosis of COVID-19 infection under section 564(b)(1) of the Act, 21 U.S.C. 655VZS-8(O) (1), unless the authorization is terminated or revoked sooner. When diagnostic testing is negativ e, the possibility of a false negative result should be considered in the context  of a patient's recent exposures and the presence of clinical signs and symptoms consistent with COVID-19. An individual without symptoms of COVID-19 and who is not shedding SARS-CoV-2 virus would expect to have a negative (not detected) result in this assay.   SARS-COV-2, NAA 2 DAY TAT     Status: None   Collection Time:  06/02/20 12:00 AM   Nasopharynge  Result Value Ref Range   SARS-CoV-2, NAA 2 DAY TAT Performed

## 2020-06-04 NOTE — Telephone Encounter (Signed)
Called to Discuss with patient about Covid symptoms and the use of the monoclonal antibody infusion for those with mild to moderate Covid symptoms and at a high risk of hospitalization.     Pt appears to qualify for this infusion due to co-morbid conditions and/or a member of an at-risk group in accordance with the FDA Emergency Use Authorization.    Gabriel Moon tested positive for COVID 19 on 12/14. Symptoms started on 12/8 and is currently having mild symptoms including congestion. Qualifying risk factors include BMI >25 (38), hypertension, and dyslipidemia.   Discussed the options of treatment vs watchful waiting and he would like to wait at this time with th understanding that he would need to be infused by Saturday, 12/18 to qualify. Hotline number provided for call back if needed.  Marcos Eke, NP 06/04/2020 5:51 PM

## 2020-06-14 ENCOUNTER — Other Ambulatory Visit: Payer: Self-pay | Admitting: Internal Medicine

## 2020-06-17 DIAGNOSIS — M179 Osteoarthritis of knee, unspecified: Secondary | ICD-10-CM | POA: Insufficient documentation

## 2020-07-18 ENCOUNTER — Other Ambulatory Visit: Payer: Self-pay | Admitting: Internal Medicine

## 2020-07-18 NOTE — Telephone Encounter (Signed)
Please refill as per office routine med refill policy (all routine meds refilled for 3 mo or monthly per pt preference up to one year from last visit, then month to month grace period for 3 mo, then further med refills will have to be denied)  

## 2020-07-23 ENCOUNTER — Other Ambulatory Visit: Payer: Self-pay | Admitting: *Deleted

## 2020-07-23 DIAGNOSIS — E291 Testicular hypofunction: Secondary | ICD-10-CM

## 2020-07-24 MED ORDER — XYOSTED 100 MG/0.5ML ~~LOC~~ SOAJ
100.0000 mg | SUBCUTANEOUS | 2 refills | Status: DC
Start: 2020-07-24 — End: 2020-10-05

## 2020-09-02 ENCOUNTER — Other Ambulatory Visit: Payer: Self-pay

## 2020-09-02 ENCOUNTER — Ambulatory Visit: Payer: Self-pay | Admitting: Physician Assistant

## 2020-09-02 ENCOUNTER — Encounter: Payer: Self-pay | Admitting: Physician Assistant

## 2020-09-02 VITALS — BP 125/79 | HR 73 | Temp 97.7°F | Resp 14 | Ht 68.0 in | Wt 240.0 lb

## 2020-09-02 DIAGNOSIS — H6120 Impacted cerumen, unspecified ear: Secondary | ICD-10-CM

## 2020-09-02 NOTE — Progress Notes (Signed)
   Subjective: Cerumenl impaction    Patient ID: Gabriel Moon, male    DOB: [birth year], 50 y.o.   MRN: [id number]  HPI  Patient presents for evaluation of cerumen impaction.  Patient stated mild hearing loss.  Patient is a chronic condition requiring periodic irrigations. Review of Systems    Anxiety, hyperlipidemia, hypogonadism and arthritis. Objective:   Physical Exam No acute distress.  Left ear canal with cerumen impaction.  Right canal clear.       Assessment & Plan: Cerumen impaction.  Left ear was irrigated with complete resolution of impaction.  Patient advised to use earwax softener weekly and follow-up as needed.

## 2020-09-08 ENCOUNTER — Other Ambulatory Visit: Payer: Self-pay | Admitting: Internal Medicine

## 2020-09-08 DIAGNOSIS — M19171 Post-traumatic osteoarthritis, right ankle and foot: Secondary | ICD-10-CM | POA: Diagnosis not present

## 2020-09-08 DIAGNOSIS — M65871 Other synovitis and tenosynovitis, right ankle and foot: Secondary | ICD-10-CM | POA: Diagnosis not present

## 2020-09-08 DIAGNOSIS — M25571 Pain in right ankle and joints of right foot: Secondary | ICD-10-CM | POA: Diagnosis not present

## 2020-09-18 DIAGNOSIS — G4733 Obstructive sleep apnea (adult) (pediatric): Secondary | ICD-10-CM | POA: Diagnosis not present

## 2020-09-30 ENCOUNTER — Other Ambulatory Visit: Payer: Self-pay

## 2020-09-30 MED ORDER — MONTELUKAST SODIUM 10 MG PO TABS: ORAL_TABLET | ORAL | 0 refills | Status: DC

## 2020-10-03 NOTE — Progress Notes (Signed)
10/05/2020  9:21 AM   Gabriel Moon [birth year] [id number]  Referring provider: Corwin Levins, MD [address] [place],  [place] [address] Chief Complaint  Patient presents with  . Hypogonadism    HPI: Gabriel Moon is a 50 y.o. male who presents today for annual follow-up of nephrolithiasis and hypogonadism  - Patient has a recurrent history of recurrent stone disease - Most recent PSA on 04/06/2020 was 0.7 (has not increased from previous 6 months) - Most recent testosterone on 04/06/2020 was 409 - Most recent HCT on 04/06/2020 was 49.1 (decreased from 52 on 09/25/2019)  - Today the patient reports that he has been doing well  - He is still using the [place], and it is still working well for him with good energy level and libido.  No bothersome LUTS or breast changes  - Patient denies any stone issues including flank pain, hematuria  - He said that he has been on a diet and has lost some weight, he reported that he has cut cheese out, and that he plans to continue this lifestyle and diet   PMH: Past Medical History:  Diagnosis Date  . ANXIETY 01/13/2007  . Arthritis    OSTEO, MIDFOOT  . Chronic LBP 04/19/2011  . Headache   . HYPERLIPIDEMIA 01/13/2007  . Hypertension    LOST WEIGHT, NOT ON MEDS   . NEPHROLITHIASIS, HX OF 04/26/2007  . Rhinitis, allergic   . Testosterone deficiency    MALE HYPOGONADISM    Surgical History: Past Surgical History:  Procedure Laterality Date  . BONE EXOSTOSIS EXCISION Right 11/27/2014   Procedure: PARTIAL EXCISION OF TALUS;  Surgeon: Recardo Evangelist, DPM;  Location: [facility];  Service: Podiatry;  Laterality: Right;    Home Medications:  Allergies as of 10/05/2020      Reactions   Lexapro [escitalopram Oxalate]    BAD REACTION-SUICIDAL      Medication List       Accurate as of October 05, 2020  9:21 AM. If you have any questions, ask your nurse or doctor.        STOP taking these medications    oxyCODONE-acetaminophen 5-325 MG tablet Commonly known as: PERCOCET/ROXICET Stopped by: Riki Altes, MD   Vitamin D3 125 MCG (5000 UT) Caps Stopped by: Riki Altes, MD     TAKE these medications   ALPRAZolam 0.5 MG tablet Commonly known as: XANAX TAKE 1 TABLET BY MOUTH THREE TIMES DAILY AS NEEDED   amLODipine 10 MG tablet Commonly known as: NORVASC TAKE 1 TABLET(10 MG) BY MOUTH DAILY   atorvastatin 20 MG tablet Commonly known as: LIPITOR TAKE 1 TABLET(20 MG) BY MOUTH DAILY   fluticasone 50 MCG/ACT nasal spray Commonly known as: FLONASE SHAKE LIQUID AND USE 2 SPRAYS IN EACH NOSTRIL EVERY DAY   hydrochlorothiazide 12.5 MG capsule Commonly known as: MICROZIDE TAKE 1 CAPSULE(12.5 MG) BY MOUTH DAILY   meloxicam 15 MG tablet Commonly known as: MOBIC Take 1 tablet (15 mg total) by mouth daily.   montelukast 10 MG tablet Commonly known as: SINGULAIR TAKE 1 TABLET(10 MG) BY MOUTH AT BEDTIME   telmisartan 80 MG tablet Commonly known as: Micardis Take 1 tablet (80 mg total) by mouth daily.   Xyosted 100 MG/0.5ML Soaj Generic drug: Testosterone Enanthate Inject 100 mg into the skin once a week.       Allergies: Allergies  Allergen Reactions  . Lexapro [Escitalopram Oxalate]     BAD REACTION-SUICIDAL  Family History: Family History  Problem Relation Age of Onset  . Diabetes Mother   . Prostate cancer Father   . Migraines Father     Social History:   reports that he quit smoking about 18 years ago. His smoking use included cigarettes. He has a 15.00 pack-year smoking history. He has never used smokeless tobacco. He reports current alcohol use of about 2.0 standard drinks of alcohol per week. He reports that he does not use drugs.  ROS: Pertinent ROS in HPI.  Physical Exam: BP 125/79   Pulse 80   Ht 5\' 8"  (1.727 m)   Wt 235 lb (106.6 kg)   BMI 35.73 kg/m  Constitutional:  Alert and oriented, No acute distress. HEENT: [place] AT, moist mucus  membranes.  Trachea midline, no masses. Cardiovascular: No clubbing, cyanosis, or edema. Respiratory: Normal respiratory effort, no increased work of breathing. Skin: No rashes, bruises or suspicious lesions. Neurologic: Grossly intact, no focal deficits, moving all 4 extremities. Psychiatric: Normal mood and affect.  Laboratory Data:   Lab Results  Component Value Date   CREATININE 1.02 01/31/2020   CREATININE 0.94 10/31/2019   CREATININE 1.00 09/21/2018     Lab Results  Component Value Date   PSA 0.78 10/24/2017   PSA 0.73 10/20/2016   PSA 0.65 09/22/2015    Lab Results  Component Value Date   TESTOSTERONE 409 04/06/2020    Lab Results  Component Value Date   HGBA1C 5.5 01/31/2020    Pertinent Imaging:   Abdomen 1 view (KUB)  Narrative CLINICAL DATA:  Nephrolithiasis  EXAM: ABDOMEN - 1 VIEW  COMPARISON:  01/13/2020  FINDINGS: At least 4 punctate calculi overlie the left kidney in keeping with multiple renal calculi measuring up to 4 mm in greatest dimension. Previously noted calculi overlying the upper pole of the right kidney are no longer visualized and have presumably passed in the interval. No definite ureteral calculi. Multiple phleboliths are seen within the pelvis bilaterally. Normal abdominal gas pattern. No acute bone abnormality.  IMPRESSION: Stable left nephrolithiasis.  Resolution of previously noted right renal calculi suggesting interval passage since CT examination of 02/07/2020.   Electronically Signed By: 02/09/2020 MD On: 04/18/2020 03:29   I have personally reviewed the images and agree with radiologist interpretation.    Assessment & Plan:   1. Hypogonadism - Testosterone, PSA, hematocrit today  - Symtoms are stable on TRT  - 6 month follow up lab visit for testosterone/hematocrit; 1 year office visit  - Xyosted refilled  2. Nephrolithiasis - Stable  - KUB in 1 year   Follow Up:  As above  I, 04/20/2020, am acting as a scribe for Dr. Wyn Quaker.   I have reviewed the above documentation for accuracy and completeness, and I agree with the above.    Gabriel Axon, MD   [facility] [address], [address] [place], [place] [phone]

## 2020-10-05 ENCOUNTER — Other Ambulatory Visit: Payer: Self-pay

## 2020-10-05 ENCOUNTER — Encounter: Payer: Self-pay | Admitting: Urology

## 2020-10-05 ENCOUNTER — Ambulatory Visit (INDEPENDENT_AMBULATORY_CARE_PROVIDER_SITE_OTHER): Payer: 59 | Admitting: Urology

## 2020-10-05 VITALS — BP 125/79 | HR 80 | Ht 68.0 in | Wt 235.0 lb

## 2020-10-05 DIAGNOSIS — N200 Calculus of kidney: Secondary | ICD-10-CM | POA: Diagnosis not present

## 2020-10-05 DIAGNOSIS — E291 Testicular hypofunction: Secondary | ICD-10-CM | POA: Diagnosis not present

## 2020-10-05 MED ORDER — XYOSTED 100 MG/0.5ML ~~LOC~~ SOAJ: 100.0000 mg | SUBCUTANEOUS | 2 refills | Status: DC

## 2020-10-05 NOTE — Addendum Note (Signed)
Addended by: Levada Schilling on: 10/05/2020 09:58 AM   Modules accepted: Orders

## 2020-10-06 ENCOUNTER — Ambulatory Visit: Payer: Self-pay

## 2020-10-06 DIAGNOSIS — Z0110 Encounter for examination of ears and hearing without abnormal findings: Secondary | ICD-10-CM

## 2020-10-06 LAB — PSA: Prostate Specific Ag, Serum: 0.8 ng/mL (ref 0.0–4.0)

## 2020-10-06 LAB — TESTOSTERONE: Testosterone: 458 ng/dL (ref 264–916)

## 2020-10-06 LAB — HEMATOCRIT: Hematocrit: 55.2 % — ABNORMAL HIGH (ref 37.5–51.0)

## 2020-10-06 NOTE — Progress Notes (Signed)
Works in [employer] at the [employer]. The department is part of the [employer]'s [employer].  Gabriel Moon's position requires that he have an annual hearing screen.  Presents today for annual hearing screen.  AMD

## 2020-10-07 ENCOUNTER — Ambulatory Visit: Payer: 59

## 2020-10-07 ENCOUNTER — Telehealth: Payer: Self-pay

## 2020-10-07 ENCOUNTER — Telehealth: Payer: Self-pay | Admitting: Urology

## 2020-10-07 ENCOUNTER — Encounter: Payer: Self-pay | Admitting: *Deleted

## 2020-10-07 DIAGNOSIS — D751 Secondary polycythemia: Secondary | ICD-10-CM

## 2020-10-07 NOTE — Telephone Encounter (Signed)
Pt contacted clinic torequest appointment/ consult for psychiatrist. Pt was advised to concact his PCP,and or EAP per Clinical Manager. My self advised pt if he has any issues with his pcp or eap to call us back and seek what actions need to be taken. Gretel Acre

## 2020-10-07 NOTE — Telephone Encounter (Signed)
PSA 0.8 and testosterone 458.  Hematocrit significantly elevated at 55.2.  This is most likely secondary to testosterone use.  Needs to be seen by hematology.  Referral order entered

## 2020-10-08 ENCOUNTER — Telehealth: Payer: Self-pay | Admitting: Internal Medicine

## 2020-10-08 NOTE — Telephone Encounter (Signed)
Sure, ok to schedule virtual

## 2020-10-08 NOTE — Telephone Encounter (Signed)
Patient called and was wondering if he could do a virtual visit to discuss a medication that his therapist recommended. Please advise

## 2020-10-09 ENCOUNTER — Ambulatory Visit: Payer: Self-pay

## 2020-10-12 ENCOUNTER — Telehealth (INDEPENDENT_AMBULATORY_CARE_PROVIDER_SITE_OTHER): Payer: 59 | Admitting: Internal Medicine

## 2020-10-12 ENCOUNTER — Other Ambulatory Visit: Payer: Self-pay

## 2020-10-12 DIAGNOSIS — F418 Other specified anxiety disorders: Secondary | ICD-10-CM

## 2020-10-12 DIAGNOSIS — R69 Illness, unspecified: Secondary | ICD-10-CM | POA: Diagnosis not present

## 2020-10-12 MED ORDER — VENLAFAXINE HCL ER 150 MG PO CP24: 150.0000 mg | ORAL_CAPSULE | Freq: Every day | ORAL | 3 refills | Status: DC

## 2020-10-12 NOTE — Progress Notes (Signed)
Patient ID: Gabriel Moon, male   DOB: [birth year], 50 y.o.   MRN: [id number]  Virtual Visit via Video Note  I connected with Gabriel Moon on 10/12/20 at  3:40 PM EDT by a video enabled telemedicine application and verified that I am speaking with the correct person using two identifiers.  Location of all participants today Patient: at home Provider: at office   I discussed the limitations of evaluation and management by telemedicine and the availability of in person appointments. The patient expressed understanding and agreed to proceed.  History of Present Illness: Pt here, states had covid infection dec 2021, since then with increased anxiety - Denies worsening depressive symptoms, suicidal ideation, or panic; but has ongoing anxiety.  Pt denies chest pain, increased sob or doe, wheezing, orthopnea, PND, increased LE swelling, palpitations, dizziness or syncope.  Seeing counselor, who suggested he call for medication tx.   Past Medical History:  Diagnosis Date  . ANXIETY 01/13/2007  . Arthritis    OSTEO, MIDFOOT  . Chronic LBP 04/19/2011  . Headache   . HYPERLIPIDEMIA 01/13/2007  . Hypertension    LOST WEIGHT, NOT ON MEDS   . NEPHROLITHIASIS, HX OF 04/26/2007  . Rhinitis, allergic   . Testosterone deficiency    MALE HYPOGONADISM   Past Surgical History:  Procedure Laterality Date  . BONE EXOSTOSIS EXCISION Right 11/27/2014   Procedure: PARTIAL EXCISION OF TALUS;  Surgeon: Recardo Evangelist, DPM;  Location: [facility];  Service: Podiatry;  Laterality: Right;    reports that he quit smoking about 18 years ago. His smoking use included cigarettes. He has a 15.00 pack-year smoking history. He has never used smokeless tobacco. He reports current alcohol use of about 2.0 standard drinks of alcohol per week. He reports that he does not use drugs. family history includes Diabetes in his mother; Migraines in his father; Prostate cancer in his father. Allergies  Allergen Reactions   . Lexapro [Escitalopram Oxalate]     BAD REACTION-SUICIDAL   Current Outpatient Medications on File Prior to Visit  Medication Sig Dispense Refill  . ALPRAZolam (XANAX) 0.5 MG tablet TAKE 1 TABLET BY MOUTH THREE TIMES DAILY AS NEEDED 90 tablet 2  . amLODipine (NORVASC) 10 MG tablet TAKE 1 TABLET(10 MG) BY MOUTH DAILY 90 tablet 3  . atorvastatin (LIPITOR) 20 MG tablet TAKE 1 TABLET(20 MG) BY MOUTH DAILY 90 tablet 3  . fluticasone (FLONASE) 50 MCG/ACT nasal spray SHAKE LIQUID AND USE 2 SPRAYS IN EACH NOSTRIL EVERY DAY 16 g 11  . hydrochlorothiazide (MICROZIDE) 12.5 MG capsule TAKE 1 CAPSULE(12.5 MG) BY MOUTH DAILY 90 capsule 3  . meloxicam (MOBIC) 15 MG tablet Take 1 tablet (15 mg total) by mouth daily. 30 tablet 2  . montelukast (SINGULAIR) 10 MG tablet TAKE 1 TABLET(10 MG) BY MOUTH AT BEDTIME 30 tablet 0  . telmisartan (MICARDIS) 80 MG tablet Take 1 tablet (80 mg total) by mouth daily. 90 tablet 3  . Testosterone Enanthate (XYOSTED) 100 MG/0.5ML SOAJ Inject 100 mg into the skin once a week. 2 mL 2   No current facility-administered medications on file prior to visit.    Observations/Objective: Alert, NAD, appropriate mood and affect, resps normal, cn 2-12 intact, moves all 4s, no visible rash or swelling Lab Results  Component Value Date   WBC 8.3 11/28/2018   HGB 18.7 (H) 07/09/2019   HCT 55.2 (H) 10/05/2020   PLT 246 11/28/2018   GLUCOSE 90 01/31/2020   CHOL 130 01/31/2020  TRIG 138 01/31/2020   HDL 38 (L) 01/31/2020   LDLDIRECT 137.0 10/31/2019   LDLCALC 70 01/31/2020   ALT 42 01/31/2020   AST 19 01/31/2020   NA 139 01/31/2020   K 3.9 01/31/2020   CL 104 01/31/2020   CREATININE 1.02 01/31/2020   BUN 12 01/31/2020   CO2 28 01/31/2020   TSH 1.87 10/31/2019   PSA 0.78 10/24/2017   HGBA1C 5.5 01/31/2020   Assessment and Plan: See notes  Follow Up Instructions: See notes   I discussed the assessment and treatment plan with the patient. The patient was provided an  opportunity to ask questions and all were answered. The patient agreed with the plan and demonstrated an understanding of the instructions.   The patient was advised to call back or seek an in-person evaluation if the symptoms worsen or if the condition fails to improve as anticipated.  Gabriel Barre, MD

## 2020-10-12 NOTE — Progress Notes (Signed)
Per Dr. Lonna Cobb - testosterone 458.  Hematocrit significantly elevated at 55.2

## 2020-10-12 NOTE — Progress Notes (Signed)
Contacted Mr. Gabriel Moon prior to his new patient apt with Dr. Donneta Romberg. New Patient ref. for polycythemia. Pt has no medical complaints. He normally get therapeutic phlebotomies drawn every 6 months. [facility]

## 2020-10-13 ENCOUNTER — Inpatient Hospital Stay: Payer: 59

## 2020-10-13 ENCOUNTER — Encounter: Payer: Self-pay | Admitting: Internal Medicine

## 2020-10-13 ENCOUNTER — Inpatient Hospital Stay: Payer: 59 | Attending: Internal Medicine | Admitting: Internal Medicine

## 2020-10-13 VITALS — BP 127/88 | HR 84 | Temp 97.6°F | Resp 20 | Ht 68.0 in | Wt 234.6 lb

## 2020-10-13 VITALS — BP 113/80 | HR 72

## 2020-10-13 DIAGNOSIS — Z87891 Personal history of nicotine dependence: Secondary | ICD-10-CM | POA: Diagnosis not present

## 2020-10-13 DIAGNOSIS — D751 Secondary polycythemia: Secondary | ICD-10-CM

## 2020-10-13 DIAGNOSIS — Z8042 Family history of malignant neoplasm of prostate: Secondary | ICD-10-CM | POA: Diagnosis not present

## 2020-10-13 NOTE — Progress Notes (Signed)
[facility] CONSULT NOTE  Patient Care Team: Corwin Levins, MD as PCP - General  CHIEF COMPLAINTS/PURPOSE OF CONSULTATION: ERYTHROCYTOSIS   HEMATOLOGY HISTORY  # ERYTHROCYTOSIS sec to testosterone [Dr.Stoioff] [Hb; WBC; platelets]  # Hypogonadism [100 injection- 5-6 years]; OSA/CPAP; social smoker  HISTORY OF PRESENTING ILLNESS:  Gabriel Moon 50 y.o.  male pleasant patient is referred to [place] for further evaluation of erythrocytosis incidentally noted on routine blood work.  Patient denies any flushing.  Denies any worsening fatigue.  Denies any history of thromboembolic events.  Patient has history of testosterone use at least since 2017.  Patient has been donating blood every 6 to 8 months either with [facility] or with [facility] [place].    Review of Systems  Constitutional: Negative for chills, diaphoresis, fever, malaise/fatigue and weight loss.  HENT: Negative for nosebleeds and sore throat.   Eyes: Negative for double vision.  Respiratory: Negative for cough, hemoptysis, sputum production, shortness of breath and wheezing.   Cardiovascular: Negative for chest pain, palpitations, orthopnea and leg swelling.  Gastrointestinal: Negative for abdominal pain, blood in stool, constipation, diarrhea, heartburn, melena, nausea and vomiting.  Genitourinary: Negative for dysuria, frequency and urgency.  Musculoskeletal: Positive for back pain. Negative for joint pain.  Skin: Negative.  Negative for itching and rash.  Neurological: Negative for dizziness, tingling, focal weakness, weakness and headaches.  Endo/Heme/Allergies: Does not bruise/bleed easily.  Psychiatric/Behavioral: Negative for depression. The patient is not nervous/anxious and does not have insomnia.      MEDICAL HISTORY:  Past Medical History:  Diagnosis Date  . ANXIETY 01/13/2007  . Arthritis    OSTEO, MIDFOOT  . Chronic LBP 04/19/2011  . Headache   . HYPERLIPIDEMIA 01/13/2007  . Hypertension     LOST WEIGHT, NOT ON MEDS   . NEPHROLITHIASIS, HX OF 04/26/2007  . Rhinitis, allergic   . Testosterone deficiency    MALE HYPOGONADISM    SURGICAL HISTORY: Past Surgical History:  Procedure Laterality Date  . BONE EXOSTOSIS EXCISION Right 11/27/2014   Procedure: PARTIAL EXCISION OF TALUS;  Surgeon: Recardo Evangelist, DPM;  Location: [facility];  Service: Podiatry;  Laterality: Right;    SOCIAL HISTORY: Social History   Socioeconomic History  . Marital status: Divorced    Spouse name: Not on file  . Number of children: Not on file  . Years of education: Not on file  . Highest education level: Not on file  Occupational History  . Occupation: [employer]  Tobacco Use  . Smoking status: Former Smoker    Packs/day: 1.00    Years: 15.00    Pack years: 15.00    Types: Cigarettes    Quit date: 06/20/2002    Years since quitting: 18.3  . Smokeless tobacco: Never Used  Substance and Sexual Activity  . Alcohol use: Yes    Alcohol/week: 2.0 standard drinks    Types: 2 Cans of beer per week    Comment: 6 OR 7 PER MONTH  . Drug use: No  . Sexual activity: Yes    Birth control/protection: None  Other Topics Concern  . Not on file  Social History Narrative   [facility] [place]; light smoker [1 for 10 days]; alcohol- once couple of weeks; lives in [place]-self.     Social Determinants of Health   Financial Resource Strain: Not on file  Food Insecurity: Not on file  Transportation Needs: Not on file  Physical Activity: Not on file  Stress: Not on file  Social Connections: Not on file  Intimate Partner Violence: Not on file    FAMILY HISTORY: Family History  Problem Relation Age of Onset  . Diabetes Mother   . Prostate cancer Father   . Migraines Father     ALLERGIES:  is allergic to lexapro [escitalopram oxalate].  MEDICATIONS:  Current Outpatient Medications  Medication Sig Dispense Refill  . ALPRAZolam (XANAX) 0.5 MG  tablet TAKE 1 TABLET BY MOUTH THREE TIMES DAILY AS NEEDED 90 tablet 2  . amLODipine (NORVASC) 10 MG tablet TAKE 1 TABLET(10 MG) BY MOUTH DAILY 90 tablet 3  . atorvastatin (LIPITOR) 20 MG tablet TAKE 1 TABLET(20 MG) BY MOUTH DAILY 90 tablet 3  . fluticasone (FLONASE) 50 MCG/ACT nasal spray SHAKE LIQUID AND USE 2 SPRAYS IN EACH NOSTRIL EVERY DAY 16 g 11  . hydrochlorothiazide (MICROZIDE) 12.5 MG capsule TAKE 1 CAPSULE(12.5 MG) BY MOUTH DAILY 90 capsule 3  . meloxicam (MOBIC) 15 MG tablet Take 1 tablet (15 mg total) by mouth daily. 30 tablet 2  . montelukast (SINGULAIR) 10 MG tablet TAKE 1 TABLET(10 MG) BY MOUTH AT BEDTIME 30 tablet 0  . telmisartan (MICARDIS) 80 MG tablet Take 1 tablet (80 mg total) by mouth daily. 90 tablet 3  . Testosterone Enanthate (XYOSTED) 100 MG/0.5ML SOAJ Inject 100 mg into the skin once a week. 2 mL 2  . venlafaxine XR (EFFEXOR XR) 150 MG 24 hr capsule Take 1 capsule (150 mg total) by mouth daily with breakfast. (Patient not taking: Reported on 10/13/2020) 90 capsule 3   No current facility-administered medications for this visit.     Marland Kitchen  PHYSICAL EXAMINATION:   Vitals:   10/13/20 1136  BP: 127/88  Pulse: 84  Resp: 20  Temp: 97.6 F (36.4 C)   Filed Weights   10/13/20 1136  Weight: 234 lb 9.6 oz (106.4 kg)    Physical Exam HENT:     Head: Normocephalic and atraumatic.     Mouth/Throat:     Pharynx: No oropharyngeal exudate.  Eyes:     Pupils: Pupils are equal, round, and reactive to light.  Cardiovascular:     Rate and Rhythm: Normal rate and regular rhythm.  Pulmonary:     Effort: No respiratory distress.     Breath sounds: No wheezing.  Abdominal:     General: Bowel sounds are normal. There is no distension.     Palpations: Abdomen is soft. There is no mass.     Tenderness: There is no abdominal tenderness. There is no guarding or rebound.  Musculoskeletal:        General: No tenderness. Normal range of motion.     Cervical back: Normal range  of motion and neck supple.  Skin:    General: Skin is warm.  Neurological:     Mental Status: He is alert and oriented to person, place, and time.  Psychiatric:        Mood and Affect: Affect normal.      LABORATORY DATA:  I have reviewed the data as listed Lab Results  Component Value Date   WBC 8.3 11/28/2018   HGB 18.7 (H) 07/09/2019   HCT 55.2 (H) 10/05/2020   MCV 93 11/28/2018   PLT 246 11/28/2018   Recent Labs    10/31/19 0858 01/31/20 0942 01/31/20 0943  NA 137 139  --   K 3.6 3.9  --   CL 101 104  --   CO2 29 28  --  GLUCOSE 103* 90  --   BUN 17 12  --   CREATININE 0.94 1.02  --   CALCIUM 8.9 9.7  --   GFRNONAA  --  87  --   GFRAA  --  100  --   PROT 6.9  --  7.1  ALBUMIN 4.2  --   --   AST 22  --  19  ALT 45  --  42  ALKPHOS 59  --   --   BILITOT 0.6  --  0.6  BILIDIR 0.1  --  0.1  IBILI  --   --  0.5     No results found.  ASSESSMENT & PLAN:   Erythrocytosis #Erythrocytosis-clinically asymptomatic.  I had a long discussion the patient regarding the potential causes of her abnormal blood counts-both primary bone marrow problem versus reactive/secondary causes.  Clinically I suspect secondary causes-testosterone supplementation.  # I discussed the role of phlebotomy in bringing the hematocrit/hemoglobin down-avoid any thromboembolic events.  The role of phlebotomy in secondary erythrocytosis is unclear/controversial.  However, phlebotomy is a standard of care [goal hematocrit less than 45] for polycythemia vera.  Most recent hematocrit 55; proceed with phlebotomy today.  #I would recommend JAK2 testing to rule out any primary causes.  Thank you Dr.Stoioff  for allowing me to participate in the care of your pleasant patient. Please do not hesitate to contact me with questions or concerns in the interim.  With regards to phlebotomy in the future-patient was given the option of following up with [facility] [place] [facility] blood donation.  He  will call us if any future phlebotomy appointments are needed.  # DISPOSITION: will call # phlebotomy today # lab- Jak-2 testing today # Follow up -TBD- Dr.B   Earna Coder, MD 10/14/2020 7:45 AM

## 2020-10-13 NOTE — Assessment & Plan Note (Addendum)
#  Erythrocytosis-clinically asymptomatic.  I had a long discussion the patient regarding the potential causes of her abnormal blood counts-both primary bone marrow problem versus reactive/secondary causes.  Clinically I suspect secondary causes-testosterone supplementation.  # I discussed the role of phlebotomy in bringing the hematocrit/hemoglobin down-avoid any thromboembolic events.  The role of phlebotomy in secondary erythrocytosis is unclear/controversial.  However, phlebotomy is a standard of care [goal hematocrit less than 45] for polycythemia vera.  Most recent hematocrit 55; proceed with phlebotomy today.  #I would recommend JAK2 testing to rule out any primary causes.  Thank you Dr.Stoioff  for allowing me to participate in the care of your pleasant patient. Please do not hesitate to contact me with questions or concerns in the interim.  With regards to phlebotomy in the future-patient was given the option of following up with [facility] [place] [facility] blood donation.  He will call us if any future phlebotomy appointments are needed.  # DISPOSITION: will call # phlebotomy today # lab- Jak-2 testing today # Follow up -TBD- Dr.B

## 2020-10-13 NOTE — Patient Instructions (Signed)

## 2020-10-14 ENCOUNTER — Encounter: Payer: 59 | Admitting: Internal Medicine

## 2020-10-14 LAB — ERYTHROPOIETIN: Erythropoietin: 8.3 m[IU]/mL (ref 2.6–18.5)

## 2020-10-18 DIAGNOSIS — G4733 Obstructive sleep apnea (adult) (pediatric): Secondary | ICD-10-CM | POA: Diagnosis not present

## 2020-10-19 ENCOUNTER — Encounter: Payer: Self-pay | Admitting: Internal Medicine

## 2020-10-19 NOTE — Patient Instructions (Signed)
Please take all new medication as prescribed 

## 2020-10-19 NOTE — Assessment & Plan Note (Signed)
With mild worsening, to continue counseling, also add effexor xr 150,  to f/u any worsening symptoms or concerns

## 2020-10-23 LAB — JAK2 GENOTYPR

## 2020-10-24 ENCOUNTER — Telehealth: Payer: Self-pay | Admitting: Internal Medicine

## 2020-10-24 NOTE — Telephone Encounter (Signed)
On 5/06-I left a voicemail for the patient that he is blood work is suggestive of erythrocytosis secondary to testosterone.  JAK2 negative.  Recommend phlebotomy[facility] [place].  We will need to call us/set appointment if he wants phlebotomy at the cancer center.  GB

## 2020-10-25 ENCOUNTER — Other Ambulatory Visit: Payer: Self-pay | Admitting: Internal Medicine

## 2020-10-25 NOTE — Telephone Encounter (Signed)
Please refill as per office routine med refill policy (all routine meds refilled for 3 mo or monthly per pt preference up to one year from last visit, then month to month grace period for 3 mo, then further med refills will have to be denied)  

## 2020-10-28 ENCOUNTER — Telehealth: Payer: Self-pay | Admitting: *Deleted

## 2020-10-28 DIAGNOSIS — D751 Secondary polycythemia: Secondary | ICD-10-CM

## 2020-10-28 NOTE — Telephone Encounter (Signed)
patient agreeable to return to clinic. He prefers 4 months.  Patient need return apt for lab/md/possible phlebotomy in 4 months.- Colette please schedule. Patient will see the apts on mychart.

## 2020-11-04 ENCOUNTER — Encounter: Payer: 59 | Admitting: Internal Medicine

## 2020-11-04 ENCOUNTER — Other Ambulatory Visit: Payer: Self-pay

## 2020-11-04 ENCOUNTER — Encounter: Payer: Self-pay | Admitting: Internal Medicine

## 2020-11-04 ENCOUNTER — Ambulatory Visit (INDEPENDENT_AMBULATORY_CARE_PROVIDER_SITE_OTHER): Payer: 59 | Admitting: Internal Medicine

## 2020-11-04 VITALS — BP 130/82 | HR 63 | Temp 98.3°F | Ht 68.0 in | Wt 228.0 lb

## 2020-11-04 DIAGNOSIS — E291 Testicular hypofunction: Secondary | ICD-10-CM

## 2020-11-04 DIAGNOSIS — E538 Deficiency of other specified B group vitamins: Secondary | ICD-10-CM

## 2020-11-04 DIAGNOSIS — F418 Other specified anxiety disorders: Secondary | ICD-10-CM | POA: Diagnosis not present

## 2020-11-04 DIAGNOSIS — R69 Illness, unspecified: Secondary | ICD-10-CM | POA: Diagnosis not present

## 2020-11-04 DIAGNOSIS — R739 Hyperglycemia, unspecified: Secondary | ICD-10-CM

## 2020-11-04 DIAGNOSIS — Z0000 Encounter for general adult medical examination without abnormal findings: Secondary | ICD-10-CM

## 2020-11-04 DIAGNOSIS — Z1159 Encounter for screening for other viral diseases: Secondary | ICD-10-CM | POA: Diagnosis not present

## 2020-11-04 DIAGNOSIS — E559 Vitamin D deficiency, unspecified: Secondary | ICD-10-CM | POA: Diagnosis not present

## 2020-11-04 DIAGNOSIS — I10 Essential (primary) hypertension: Secondary | ICD-10-CM | POA: Diagnosis not present

## 2020-11-04 LAB — URINALYSIS, ROUTINE W REFLEX MICROSCOPIC
Bilirubin Urine: NEGATIVE
Hgb urine dipstick: NEGATIVE
Ketones, ur: NEGATIVE
Leukocytes,Ua: NEGATIVE
Nitrite: NEGATIVE
RBC / HPF: NONE SEEN (ref 0–?)
Specific Gravity, Urine: 1.02 (ref 1.000–1.030)
Total Protein, Urine: NEGATIVE
Urine Glucose: NEGATIVE
Urobilinogen, UA: 0.2 (ref 0.0–1.0)
pH: 6.5 (ref 5.0–8.0)

## 2020-11-04 LAB — CBC WITH DIFFERENTIAL/PLATELET
Basophils Absolute: 0.1 10*3/uL (ref 0.0–0.1)
Basophils Relative: 0.8 % (ref 0.0–3.0)
Eosinophils Absolute: 0.3 10*3/uL (ref 0.0–0.7)
Eosinophils Relative: 4.1 % (ref 0.0–5.0)
HCT: 51.5 % (ref 39.0–52.0)
Hemoglobin: 17.7 g/dL — ABNORMAL HIGH (ref 13.0–17.0)
Lymphocytes Relative: 17 % (ref 12.0–46.0)
Lymphs Abs: 1.4 10*3/uL (ref 0.7–4.0)
MCHC: 34.3 g/dL (ref 30.0–36.0)
MCV: 94.6 fl (ref 78.0–100.0)
Monocytes Absolute: 0.9 10*3/uL (ref 0.1–1.0)
Monocytes Relative: 10.8 % (ref 3.0–12.0)
Neutro Abs: 5.4 10*3/uL (ref 1.4–7.7)
Neutrophils Relative %: 67.3 % (ref 43.0–77.0)
Platelets: 216 10*3/uL (ref 150.0–400.0)
RBC: 5.45 Mil/uL (ref 4.22–5.81)
RDW: 14 % (ref 11.5–15.5)
WBC: 8 10*3/uL (ref 4.0–10.5)

## 2020-11-04 LAB — LIPID PANEL
Cholesterol: 132 mg/dL (ref 0–200)
HDL: 34.9 mg/dL — ABNORMAL LOW (ref >39.00)
LDL Cholesterol: 83 mg/dL (ref 0–99)
NonHDL: 96.66
Total CHOL/HDL Ratio: 4
Triglycerides: 69 mg/dL (ref 0.0–149.0)
VLDL: 13.8 mg/dL (ref 0.0–40.0)

## 2020-11-04 LAB — BASIC METABOLIC PANEL
BUN: 16 mg/dL (ref 6–23)
CO2: 28 mEq/L (ref 19–32)
Calcium: 9.4 mg/dL (ref 8.4–10.5)
Chloride: 104 mEq/L (ref 96–112)
Creatinine, Ser: 1.01 mg/dL (ref 0.40–1.50)
GFR: 87.7 mL/min (ref >60.00)
Glucose, Bld: 88 mg/dL (ref 70–99)
Potassium: 3.9 mEq/L (ref 3.5–5.1)
Sodium: 140 mEq/L (ref 135–145)

## 2020-11-04 LAB — HEPATIC FUNCTION PANEL
ALT: 26 U/L (ref 0–53)
AST: 15 U/L (ref 0–37)
Albumin: 4.3 g/dL (ref 3.5–5.2)
Alkaline Phosphatase: 55 U/L (ref 39–117)
Bilirubin, Direct: 0.1 mg/dL (ref 0.0–0.3)
Total Bilirubin: 0.5 mg/dL (ref 0.2–1.2)
Total Protein: 6.7 g/dL (ref 6.0–8.3)

## 2020-11-04 LAB — TSH: TSH: 1.95 u[IU]/mL (ref 0.35–4.50)

## 2020-11-04 LAB — HEMOGLOBIN A1C: Hgb A1c MFr Bld: 5.5 % (ref 4.6–6.5)

## 2020-11-04 LAB — VITAMIN B12: Vitamin B-12: 629 pg/mL (ref 211–911)

## 2020-11-04 LAB — VITAMIN D 25 HYDROXY (VIT D DEFICIENCY, FRACTURES): VITD: 39.21 ng/mL (ref 30.00–100.00)

## 2020-11-04 NOTE — Patient Instructions (Addendum)
Please call if you change your mind about the colonoscopy  You will be contacted regarding the referral for: psychiatry  Please continue all other medications as before, and refills have been done if requested.  Please have the pharmacy call with any other refills you may need.  Please continue your efforts at being more active, low cholesterol diet, and weight control.  You are otherwise up to date with prevention measures today.  Please keep your appointments with your specialists as you may have planned  Please go to the LAB at the blood drawing area for the tests to be done  You will be contacted by phone if any changes need to be made immediately.  Otherwise, you will receive a letter about your results with an explanation, but please check with MyChart first.  Please remember to sign up for MyChart if you have not done so, as this will be important to you in the future with finding out test results, communicating by private email, and scheduling acute appointments online when needed.  Please make an Appointment to return for your 1 year visit, or sooner if needed, with Lab testing by Appointment as well, to be done about 3-5 days before at the FIRST FLOOR Lab (so this is for TWO appointments - please see the scheduling desk as you leave)  Due to the ongoing Covid 19 pandemic, our lab now requires an appointment for any labs done at our office.  If you need labs done and do not have an appointment, please call our office ahead of time to schedule before presenting to the lab for your testing.

## 2020-11-04 NOTE — Progress Notes (Signed)
Patient ID: Gabriel Moon, male   DOB: 12-23-1970, 50 y.o.   MRN: [id number]         Chief Complaint:: wellness exam and        HPI:  Gabriel Moon is a 50 y.o. male here for wellness exam; declines covid vax and colonoscopy                        Also lost wt intentionally from 256 in aug 2021 pt curremt 228;  Has been on effexor for over 1 mo, but not really working well, did try to stay off for 4-5 days, then restarted but side effects returned with blurry vision and faigue and daytime somnolence, al resolved with stopping the med twice.   Wsa not able to take lexapro before due to suicidality.  Xanax still works ok for now.  Has now finished his friee sessions paid for by work, and now plans to continue with partial insurance coverage.  Did have mild elevated Hct now /p phlebotomy 1 unit after apr 18.    Wt Readings from Last 3 Encounters:  11/04/20 228 lb (103.4 kg)  10/13/20 234 lb 9.6 oz (106.4 kg)  10/05/20 235 lb (106.6 kg)   BP Readings from Last 3 Encounters:  11/04/20 130/82  10/13/20 113/80  10/13/20 127/88   Immunization History  Administered Date(s) Administered  . Hepatitis B 11/18/2008, 12/18/2008, 05/25/2009  . Td 06/20/2000  . Tdap 02/03/2016   There are no preventive care reminders to display for this patient.    Past Medical History:  Diagnosis Date  . ANXIETY 01/13/2007  . Arthritis    OSTEO, MIDFOOT  . Chronic LBP 04/19/2011  . Headache   . HYPERLIPIDEMIA 01/13/2007  . Hypertension    LOST WEIGHT, NOT ON MEDS   . NEPHROLITHIASIS, HX OF 04/26/2007  . Rhinitis, allergic   . Testosterone deficiency    MALE HYPOGONADISM   Past Surgical History:  Procedure Laterality Date  . BONE EXOSTOSIS EXCISION Right 11/27/2014   Procedure: PARTIAL EXCISION OF TALUS;  Surgeon: Recardo Evangelist, DPM;  Location: [facility];  Service: Podiatry;  Laterality: Right;    reports that he quit smoking about 18 years ago. His smoking use included cigarettes. He has  a 15.00 pack-year smoking history. He has never used smokeless tobacco. He reports current alcohol use of about 2.0 standard drinks of alcohol per week. He reports that he does not use drugs. family history includes Diabetes in his mother; Migraines in his father; Prostate cancer in his father. Allergies  Allergen Reactions  . Effexor [Venlafaxine] Other (See Comments)    Blurry vision, fatgue  . Lexapro [Escitalopram Oxalate]     BAD REACTION-SUICIDAL   Current Outpatient Medications on File Prior to Visit  Medication Sig Dispense Refill  . ALPRAZolam (XANAX) 0.5 MG tablet TAKE 1 TABLET BY MOUTH THREE TIMES DAILY AS NEEDED 90 tablet 2  . amLODipine (NORVASC) 10 MG tablet TAKE 1 TABLET(10 MG) BY MOUTH DAILY 90 tablet 3  . atorvastatin (LIPITOR) 20 MG tablet TAKE 1 TABLET(20 MG) BY MOUTH DAILY 90 tablet 3  . fluticasone (FLONASE) 50 MCG/ACT nasal spray SHAKE LIQUID AND USE 2 SPRAYS IN EACH NOSTRIL EVERY DAY 16 g 11  . hydrochlorothiazide (MICROZIDE) 12.5 MG capsule TAKE 1 CAPSULE(12.5 MG) BY MOUTH DAILY 90 capsule 3  . meloxicam (MOBIC) 15 MG tablet Take 1 tablet (15 mg total) by mouth daily. 30 tablet 2  . montelukast (  SINGULAIR) 10 MG tablet TAKE 1 TABLET(10 MG) BY MOUTH AT BEDTIME 30 tablet 0  . telmisartan (MICARDIS) 80 MG tablet Take 1 tablet (80 mg total) by mouth daily. 90 tablet 3  . Testosterone Enanthate (XYOSTED) 100 MG/0.5ML SOAJ Inject 100 mg into the skin once a week. 2 mL 2  . venlafaxine XR (EFFEXOR XR) 150 MG 24 hr capsule Take 1 capsule (150 mg total) by mouth daily with breakfast. 90 capsule 3   No current facility-administered medications on file prior to visit.        ROS:  All others reviewed and negative.  Objective        PE:  BP 130/82 (BP Location: Right Arm, Patient Position: Sitting, Cuff Size: Large)   Pulse 63   Temp 98.3 F (36.8 C) (Oral)   Ht 5\' 8"  (1.727 m)   Wt 228 lb (103.4 kg)   SpO2 99%   BMI 34.67 kg/m                 Constitutional: Pt  appears in NAD               HENT: Head: NCAT.                Right Ear: External ear normal.                 Left Ear: External ear normal.                Eyes: . Pupils are equal, round, and reactive to light. Conjunctivae and EOM are normal               Nose: without d/c or deformity               Neck: Neck supple. Gross normal ROM               Cardiovascular: Normal rate and regular rhythm.                 Pulmonary/Chest: Effort normal and breath sounds without rales or wheezing.                Abd:  Soft, NT, ND, + BS, no organomegaly               Neurological: Pt is alert. At baseline orientation, motor grossly intact               Skin: Skin is warm. No rashes, no other new lesions, LE edema - none               Psychiatric: Pt behavior is normal without agitation   Micro: none  Cardiac tracings I have personally interpreted today:  none  Pertinent Radiological findings (summarize): none   Lab Results  Component Value Date   WBC 8.0 11/04/2020   HGB 17.7 (H) 11/04/2020   HCT 51.5 11/04/2020   PLT 216.0 11/04/2020   GLUCOSE 88 11/04/2020   CHOL 132 11/04/2020   TRIG 69.0 11/04/2020   HDL 34.90 (L) 11/04/2020   LDLDIRECT 137.0 10/31/2019   LDLCALC 83 11/04/2020   ALT 26 11/04/2020   AST 15 11/04/2020   NA 140 11/04/2020   K 3.9 11/04/2020   CL 104 11/04/2020   CREATININE 1.01 11/04/2020   BUN 16 11/04/2020   CO2 28 11/04/2020   TSH 1.95 11/04/2020   PSA 0.78 10/24/2017   HGBA1C 5.5 11/04/2020   Assessment/Plan:  Gabriel Moon is a 50 y.o.  White or Caucasian [1] male with  has a past medical history of ANXIETY (01/13/2007), Arthritis, Chronic LBP (04/19/2011), Headache, HYPERLIPIDEMIA (01/13/2007), Hypertension, NEPHROLITHIASIS, HX OF (04/26/2007), Rhinitis, allergic, and Testosterone deficiency.  Encounter for general adult medical examination without abnormal findings Age and sex appropriate education and counseling updated with regular exercise and  diet Referrals for preventative services - declines colonoscopy Immunizations addressed - declines covid fax Smoking counseling  - none needed Evidence for depression or other mood disorder - doe psychiatry referral and xanax prn Most recent labs reviewed. I have personally reviewed and have noted: 1) the patient's medical and social history 2) The patient's current medications and supplements 3) The patient's height, weight, and BMI have been recorded in the chart   Vitamin D deficiency Last vitamin D Lab Results  Component Value Date   VD25OH 39.21 11/04/2020   Stable, cont oral replacement   Hyperglycemia Lab Results  Component Value Date   HGBA1C 5.5 11/04/2020   Stable, pt to continue current medical treatment  - diet   Essential hypertension BP Readings from Last 3 Encounters:  11/04/20 130/82  10/13/20 113/80  10/13/20 127/88   Stable, pt to continue medical treatment norvasc, micardis hct   Anxious depression Uncontrolled, for referal psychiatry, and xanax prn  Followup: Return in about 1 year (around 11/04/2021).  [name], MD 11/08/2020 11:50 AM [facility]

## 2020-11-05 LAB — HEPATITIS C ANTIBODY
Hepatitis C Ab: NONREACTIVE
SIGNAL TO CUT-OFF: 0 (ref <1.00)

## 2020-11-08 ENCOUNTER — Encounter: Payer: Self-pay | Admitting: Internal Medicine

## 2020-11-08 NOTE — Assessment & Plan Note (Signed)
Age and sex appropriate education and counseling updated with regular exercise and diet Referrals for preventative services - declines colonoscopy Immunizations addressed - declines covid fax Smoking counseling  - none needed Evidence for depression or other mood disorder - doe psychiatry referral and xanax prn Most recent labs reviewed. I have personally reviewed and have noted: 1) the patient's medical and social history 2) The patient's current medications and supplements 3) The patient's height, weight, and BMI have been recorded in the chart

## 2020-11-08 NOTE — Assessment & Plan Note (Signed)
BP Readings from Last 3 Encounters:  11/04/20 130/82  10/13/20 113/80  10/13/20 127/88   Stable, pt to continue medical treatment norvasc, micardis hct

## 2020-11-08 NOTE — Assessment & Plan Note (Signed)
Last vitamin D Lab Results  Component Value Date   VD25OH 39.21 11/04/2020   Stable, cont oral replacement

## 2020-11-08 NOTE — Assessment & Plan Note (Signed)
Lab Results  Component Value Date   HGBA1C 5.5 11/04/2020   Stable, pt to continue current medical treatment  - diet

## 2020-11-08 NOTE — Assessment & Plan Note (Signed)
Uncontrolled, for referal psychiatry, and xanax prn

## 2020-11-11 DIAGNOSIS — R69 Illness, unspecified: Secondary | ICD-10-CM | POA: Diagnosis not present

## 2020-11-18 DIAGNOSIS — G4733 Obstructive sleep apnea (adult) (pediatric): Secondary | ICD-10-CM | POA: Diagnosis not present

## 2020-11-20 ENCOUNTER — Other Ambulatory Visit: Payer: Self-pay | Admitting: Internal Medicine

## 2020-11-20 NOTE — Telephone Encounter (Signed)
Please refill as per office routine med refill policy (all routine meds refilled for 3 mo or monthly per pt preference up to one year from last visit, then month to month grace period for 3 mo, then further med refills will have to be denied)  

## 2020-11-25 ENCOUNTER — Other Ambulatory Visit: Payer: Self-pay

## 2020-11-25 DIAGNOSIS — R69 Illness, unspecified: Secondary | ICD-10-CM | POA: Diagnosis not present

## 2020-11-25 MED ORDER — TELMISARTAN 80 MG PO TABS: 80.0000 mg | ORAL_TABLET | Freq: Every day | ORAL | 3 refills | Status: DC

## 2020-12-08 DIAGNOSIS — R69 Illness, unspecified: Secondary | ICD-10-CM | POA: Diagnosis not present

## 2020-12-14 DIAGNOSIS — R69 Illness, unspecified: Secondary | ICD-10-CM | POA: Diagnosis not present

## 2020-12-14 DIAGNOSIS — F321 Major depressive disorder, single episode, moderate: Secondary | ICD-10-CM | POA: Diagnosis not present

## 2020-12-16 DIAGNOSIS — R69 Illness, unspecified: Secondary | ICD-10-CM | POA: Diagnosis not present

## 2020-12-20 DIAGNOSIS — M25552 Pain in left hip: Secondary | ICD-10-CM | POA: Diagnosis not present

## 2020-12-28 DIAGNOSIS — M1612 Unilateral primary osteoarthritis, left hip: Secondary | ICD-10-CM | POA: Diagnosis not present

## 2020-12-30 DIAGNOSIS — M79605 Pain in left leg: Secondary | ICD-10-CM | POA: Diagnosis not present

## 2020-12-30 DIAGNOSIS — M545 Low back pain, unspecified: Secondary | ICD-10-CM | POA: Diagnosis not present

## 2021-01-04 ENCOUNTER — Other Ambulatory Visit: Payer: Self-pay | Admitting: Family Medicine

## 2021-01-04 DIAGNOSIS — R69 Illness, unspecified: Secondary | ICD-10-CM | POA: Diagnosis not present

## 2021-01-04 DIAGNOSIS — E291 Testicular hypofunction: Secondary | ICD-10-CM

## 2021-01-04 DIAGNOSIS — F321 Major depressive disorder, single episode, moderate: Secondary | ICD-10-CM | POA: Diagnosis not present

## 2021-01-04 MED ORDER — XYOSTED 100 MG/0.5ML ~~LOC~~ SOAJ: 100.0000 mg | SUBCUTANEOUS | 2 refills | Status: DC

## 2021-01-13 DIAGNOSIS — R69 Illness, unspecified: Secondary | ICD-10-CM | POA: Diagnosis not present

## 2021-01-27 DIAGNOSIS — R69 Illness, unspecified: Secondary | ICD-10-CM | POA: Diagnosis not present

## 2021-02-01 DIAGNOSIS — R69 Illness, unspecified: Secondary | ICD-10-CM | POA: Diagnosis not present

## 2021-02-03 ENCOUNTER — Ambulatory Visit: Payer: Self-pay

## 2021-02-03 ENCOUNTER — Other Ambulatory Visit: Payer: Self-pay

## 2021-02-03 VITALS — BP 126/75 | HR 78

## 2021-02-03 DIAGNOSIS — Z0130 Encounter for examination of blood pressure without abnormal findings: Secondary | ICD-10-CM

## 2021-02-03 NOTE — Progress Notes (Signed)
Pt presented today for BP. CL,RMA

## 2021-02-09 ENCOUNTER — Inpatient Hospital Stay: Payer: 59 | Attending: Internal Medicine | Admitting: Oncology

## 2021-02-09 ENCOUNTER — Encounter: Payer: Self-pay | Admitting: Oncology

## 2021-02-09 ENCOUNTER — Inpatient Hospital Stay: Payer: 59

## 2021-02-09 VITALS — BP 107/70 | HR 64

## 2021-02-09 VITALS — BP 117/74 | HR 71 | Temp 98.9°F | Resp 16 | Wt 217.0 lb

## 2021-02-09 DIAGNOSIS — D751 Secondary polycythemia: Secondary | ICD-10-CM

## 2021-02-09 LAB — CBC WITH DIFFERENTIAL/PLATELET
Abs Immature Granulocytes: 0.06 10*3/uL (ref 0.00–0.07)
Basophils Absolute: 0.1 10*3/uL (ref 0.0–0.1)
Basophils Relative: 1 %
Eosinophils Absolute: 0.2 10*3/uL (ref 0.0–0.5)
Eosinophils Relative: 2 %
HCT: 50.8 % (ref 39.0–52.0)
Hemoglobin: 18.1 g/dL — ABNORMAL HIGH (ref 13.0–17.0)
Immature Granulocytes: 1 %
Lymphocytes Relative: 14 %
Lymphs Abs: 1.3 10*3/uL (ref 0.7–4.0)
MCH: 32 pg (ref 26.0–34.0)
MCHC: 35.6 g/dL (ref 30.0–36.0)
MCV: 89.9 fL (ref 80.0–100.0)
Monocytes Absolute: 0.7 10*3/uL (ref 0.1–1.0)
Monocytes Relative: 8 %
Neutro Abs: 6.7 10*3/uL (ref 1.7–7.7)
Neutrophils Relative %: 74 %
Platelets: 232 10*3/uL (ref 150–400)
RBC: 5.65 MIL/uL (ref 4.22–5.81)
RDW: 14 % (ref 11.5–15.5)
WBC: 9 10*3/uL (ref 4.0–10.5)
nRBC: 0 % (ref 0.0–0.2)

## 2021-02-09 LAB — COMPREHENSIVE METABOLIC PANEL
ALT: 34 U/L (ref 0–44)
AST: 21 U/L (ref 15–41)
Albumin: 4.7 g/dL (ref 3.5–5.0)
Alkaline Phosphatase: 65 U/L (ref 38–126)
Anion gap: 9 (ref 5–15)
BUN: 16 mg/dL (ref 6–20)
CO2: 27 mmol/L (ref 22–32)
Calcium: 9.7 mg/dL (ref 8.9–10.3)
Chloride: 100 mmol/L (ref 98–111)
Creatinine, Ser: 1.14 mg/dL (ref 0.61–1.24)
GFR, Estimated: >60 mL/min (ref >60)
Glucose, Bld: 114 mg/dL — ABNORMAL HIGH (ref 70–99)
Potassium: 3.7 mmol/L (ref 3.5–5.1)
Sodium: 136 mmol/L (ref 135–145)
Total Bilirubin: 0.8 mg/dL (ref 0.3–1.2)
Total Protein: 7.9 g/dL (ref 6.5–8.1)

## 2021-02-09 NOTE — Progress Notes (Signed)
[facility] CONSULT NOTE  Patient Care Team: Corwin Levins, MD as PCP - General  CHIEF COMPLAINTS/PURPOSE OF CONSULTATION: ERYTHROCYTOSIS   HEMATOLOGY HISTORY  # ERYTHROCYTOSIS sec to testosterone [Dr.Stoioff] [Hb; WBC; platelets]  # Hypogonadism [100 injection- 5-6 years]; OSA/CPAP; social smoker  HISTORY OF PRESENTING ILLNESS:  Gabriel Moon is a 50 year old male who sees Dr. Donneta Romberg for erythrocytosis likely secondary to testosterone supplements.  This was discovered incidentally on routine blood work.  He has been donating blood every 6 to 8 months with the [employer] or city of [facility] health department.  Interval history: Reports doing well since his last visit.  States he develops shoulder tightness when his hemoglobin is elevated which he developed a few weeks ago.  Denies any concerning symptoms.  Reports he returned to the gym and feels much better.  Review of Systems  Constitutional: Negative.  Negative for chills, fever, malaise/fatigue and weight loss.  HENT:  Negative for congestion, ear pain and tinnitus.   Eyes: Negative.  Negative for blurred vision and double vision.  Respiratory: Negative.  Negative for cough, sputum production and shortness of breath.   Cardiovascular: Negative.  Negative for chest pain, palpitations and leg swelling.  Gastrointestinal: Negative.  Negative for abdominal pain, constipation, diarrhea, nausea and vomiting.  Genitourinary:  Negative for dysuria, frequency and urgency.  Musculoskeletal:  Positive for joint pain (Bilateral shoudlers). Negative for back pain and falls.  Skin: Negative.  Negative for rash.  Neurological: Negative.  Negative for weakness and headaches.  Endo/Heme/Allergies: Negative.  Does not bruise/bleed easily.  Psychiatric/Behavioral: Negative.  Negative for depression. The patient is not nervous/anxious and does not have insomnia.     MEDICAL HISTORY:  Past Medical History:  Diagnosis Date    ANXIETY 01/13/2007   Arthritis    OSTEO, MIDFOOT   Chronic LBP 04/19/2011   Headache    HYPERLIPIDEMIA 01/13/2007   Hypertension    LOST WEIGHT, NOT ON MEDS    NEPHROLITHIASIS, HX OF 04/26/2007   Rhinitis, allergic    Testosterone deficiency    MALE HYPOGONADISM    SURGICAL HISTORY: Past Surgical History:  Procedure Laterality Date   BONE EXOSTOSIS EXCISION Right 11/27/2014   Procedure: PARTIAL EXCISION OF TALUS;  Surgeon: Recardo Evangelist, DPM;  Location: [facility];  Service: Podiatry;  Laterality: Right;    SOCIAL HISTORY: Social History   Socioeconomic History   Marital status: Divorced    Spouse name: Not on file   Number of children: Not on file   Years of education: Not on file   Highest education level: Not on file  Occupational History   Occupation: Radiographer, therapeutic service  Tobacco Use   Smoking status: Former    Packs/day: 1.00    Years: 15.00    Pack years: 15.00    Types: Cigarettes    Quit date: 06/20/2002    Years since quitting: 18.6   Smokeless tobacco: Never  Substance and Sexual Activity   Alcohol use: Yes    Alcohol/week: 2.0 standard drinks    Types: 2 Cans of beer per week    Comment: 6 OR 7 PER MONTH   Drug use: No   Sexual activity: Yes    Birth control/protection: None  Other Topics Concern   Not on file  Social History Narrative   [place]; light smoker [1 for 10 days]; alcohol- once couple of weeks; lives in [place]-self.     Social Determinants of Health  Financial Resource Strain: Not on file  Food Insecurity: Not on file  Transportation Needs: Not on file  Physical Activity: Not on file  Stress: Not on file  Social Connections: Not on file  Intimate Partner Violence: Not on file    FAMILY HISTORY: Family History  Problem Relation Age of Onset   Diabetes Mother    Prostate cancer Father    Migraines Father     ALLERGIES:  is allergic to effexor [venlafaxine] and lexapro  [escitalopram oxalate].  MEDICATIONS:  Current Outpatient Medications  Medication Sig Dispense Refill   ALPRAZolam (XANAX) 0.5 MG tablet TAKE 1 TABLET BY MOUTH THREE TIMES DAILY AS NEEDED 90 tablet 2   amLODipine (NORVASC) 10 MG tablet TAKE 1 TABLET(10 MG) BY MOUTH DAILY 90 tablet 3   atorvastatin (LIPITOR) 20 MG tablet TAKE 1 TABLET(20 MG) BY MOUTH DAILY 90 tablet 3   fluticasone (FLONASE) 50 MCG/ACT nasal spray SHAKE LIQUID AND USE 2 SPRAYS IN EACH NOSTRIL EVERY DAY 16 g 11   hydrochlorothiazide (MICROZIDE) 12.5 MG capsule TAKE 1 CAPSULE(12.5 MG) BY MOUTH DAILY 90 capsule 3   meloxicam (MOBIC) 15 MG tablet Take 1 tablet (15 mg total) by mouth daily. 30 tablet 2   montelukast (SINGULAIR) 10 MG tablet TAKE 1 TABLET(10 MG) BY MOUTH AT BEDTIME 90 tablet 2   telmisartan (MICARDIS) 80 MG tablet Take 1 tablet (80 mg total) by mouth daily. 90 tablet 3   Testosterone Enanthate (XYOSTED) 100 MG/0.5ML SOAJ Inject 100 mg into the skin once a week. 2 mL 2   venlafaxine XR (EFFEXOR XR) 150 MG 24 hr capsule Take 1 capsule (150 mg total) by mouth daily with breakfast. 90 capsule 3   No current facility-administered medications for this visit.     Marland Kitchen  PHYSICAL EXAMINATION:   There were no vitals filed for this visit.  There were no vitals filed for this visit.   Physical Exam HENT:     Head: Normocephalic and atraumatic.     Mouth/Throat:     Pharynx: No oropharyngeal exudate.  Eyes:     Pupils: Pupils are equal, round, and reactive to light.  Cardiovascular:     Rate and Rhythm: Normal rate and regular rhythm.  Pulmonary:     Effort: No respiratory distress.     Breath sounds: No wheezing.  Abdominal:     General: Bowel sounds are normal. There is no distension.     Palpations: Abdomen is soft. There is no mass.     Tenderness: There is no abdominal tenderness. There is no guarding or rebound.  Musculoskeletal:        General: No tenderness. Normal range of motion.     Cervical back:  Normal range of motion and neck supple.  Skin:    General: Skin is warm.  Neurological:     Mental Status: He is alert and oriented to person, place, and time.  Psychiatric:        Mood and Affect: Affect normal.     LABORATORY DATA:  I have reviewed the data as listed Lab Results  Component Value Date   WBC 9.0 02/09/2021   HGB 18.1 (H) 02/09/2021   HCT 50.8 02/09/2021   MCV 89.9 02/09/2021   PLT 232 02/09/2021   Recent Labs    11/04/20 0848  NA 140  K 3.9  CL 104  CO2 28  GLUCOSE 88  BUN 16  CREATININE 1.01  CALCIUM 9.4  PROT 6.7  ALBUMIN 4.3  AST 15  ALT 26  ALKPHOS 55  BILITOT 0.5  BILIDIR 0.1      No results found.  ASSESSMENT & PLAN:  Secondary erythrocytosis- Likely d/t testosterone supplementation.  Work-up was negative for primary erythrocytosis JAK2 V 617F mutation negative. Plan was to maintain a hematocrit less than 45 with phlebotomy to prevent thromboembolic events.  He last received a phlebotomy on 10/13/2020.  Labs from today show hemoglobin of 18.1 with a hematocrit of 50.8.  Proceed with 500 mL phlebotomy today.  Follow-up with Dr. Donneta Romberg in 4 months with labs (CBC, CMP) with possible phlebotomy.  Addendum- RN was unable to get the full 500 mL due to poor blood return.  They were able to get 300 mL.  Patient to call clinic if his symptoms do not improve and if he needs additional phlebotomy.   I spent 20 minutes dedicated to the care of this patient (face-to-face and non-face-to-face) on the date of the encounter to include what is described in the assessment and plan.  No problem-specific Assessment & Plan notes found for this encounter.   Mauro Kaufmann, NP 02/09/2021 12:59 PM

## 2021-02-09 NOTE — Progress Notes (Signed)
Pt in for follow up, denies any concerns today. 

## 2021-02-09 NOTE — Progress Notes (Signed)
Therapeutic phlebotomy performed in right AC using 20g angiocath. After removed, blood flow slowed and then stopped. Phlebotomy set was disconnected, catheter was irrigated, easily flushed, and had excellent blood return. New phlebotomy set attached, but no further blood was able to be drained. Pt declined offer to restart new IV site. Dr. Donneta Romberg was informed and pt was discharged. Pt encouraged to stay well hydrated.

## 2021-03-05 DIAGNOSIS — M5136 Other intervertebral disc degeneration, lumbar region: Secondary | ICD-10-CM | POA: Diagnosis not present

## 2021-03-05 DIAGNOSIS — M5442 Lumbago with sciatica, left side: Secondary | ICD-10-CM | POA: Diagnosis not present

## 2021-03-10 DIAGNOSIS — R69 Illness, unspecified: Secondary | ICD-10-CM | POA: Diagnosis not present

## 2021-03-15 ENCOUNTER — Other Ambulatory Visit: Payer: Self-pay | Admitting: Internal Medicine

## 2021-04-02 ENCOUNTER — Other Ambulatory Visit: Payer: Self-pay

## 2021-04-02 ENCOUNTER — Other Ambulatory Visit: Payer: 59

## 2021-04-02 DIAGNOSIS — E291 Testicular hypofunction: Secondary | ICD-10-CM

## 2021-04-03 LAB — PSA: Prostate Specific Ag, Serum: 0.9 ng/mL (ref 0.0–4.0)

## 2021-04-03 LAB — TESTOSTERONE: Testosterone: 541 ng/dL (ref 264–916)

## 2021-04-03 LAB — HEMATOCRIT: Hematocrit: 52.6 % — ABNORMAL HIGH (ref 37.5–51.0)

## 2021-04-06 ENCOUNTER — Other Ambulatory Visit: Payer: Self-pay

## 2021-05-03 DIAGNOSIS — R69 Illness, unspecified: Secondary | ICD-10-CM | POA: Diagnosis not present

## 2021-05-19 ENCOUNTER — Telehealth: Payer: Self-pay | Admitting: *Deleted

## 2021-05-19 ENCOUNTER — Encounter: Payer: Self-pay | Admitting: Urology

## 2021-05-19 DIAGNOSIS — E291 Testicular hypofunction: Secondary | ICD-10-CM

## 2021-05-19 MED ORDER — XYOSTED 100 MG/0.5ML ~~LOC~~ SOAJ: 100.0000 mg | SUBCUTANEOUS | 2 refills | Status: DC

## 2021-05-19 NOTE — Telephone Encounter (Signed)
Xyosted refill

## 2021-06-01 ENCOUNTER — Other Ambulatory Visit: Payer: Self-pay

## 2021-06-01 ENCOUNTER — Encounter: Payer: Self-pay | Admitting: Physician Assistant

## 2021-06-01 ENCOUNTER — Ambulatory Visit: Payer: Self-pay | Admitting: Physician Assistant

## 2021-06-01 VITALS — BP 124/74 | HR 75 | Temp 99.1°F | Resp 12 | Ht 68.5 in | Wt 222.0 lb

## 2021-06-01 DIAGNOSIS — M62830 Muscle spasm of back: Secondary | ICD-10-CM

## 2021-06-01 MED ORDER — ORPHENADRINE CITRATE ER 100 MG PO TB12: 100.0000 mg | ORAL_TABLET | Freq: Two times a day (BID) | ORAL | 0 refills | Status: DC

## 2021-06-01 NOTE — Progress Notes (Signed)
Back spasms in lower back Started 3 weeks ago Has had it before - states in past given pain med, muscle relaxer & anti-inflammatory & it usually stops Can't get it to stop - taking ibuprofen 800 mg bid Has used ice & heat, lumbar stretcher  AMD

## 2021-06-01 NOTE — Progress Notes (Signed)
° °  Subjective: Lumbar muscle spasm    Patient ID: Gabriel Moon, male    DOB: May 02, 1971, 50 y.o.   MRN: [id number]  HPI Patient presents with 3 weeks of bilateral lumbar muscle spasm.  Patient state left greater than right.  Patient denies radicular component to his back pain.  No bladder or bowel dysfunction.  No provocative incident for complaint.  Patient has spasms of visible palpable with flexion and lateral movements.  Patient has history of chronic low back pain is normally seen by [facility].  Cannot get appointment today.   Review of Systems Anxiety, hyperlipidemia, hypertension, and hypogonadism.    Objective:   Physical Exam  Moderate distress.  Sits and stands with heavy reliance on upper extremities.  No obvious lumbar spine deformity.  Patient has palpable muscle spasm with lateral movements.  Patient range of motion decreased secondary to complaint of pain.  Patient has neck straight leg test in supine position.      Assessment & Plan: Paraspinal muscle spasms   Lidoderm patch was applied to the left lateral muscle area of the back.  Patient given prescription for Norflex and advised to follow-up if no improvement in 3 to 5 days.

## 2021-06-04 DIAGNOSIS — G8929 Other chronic pain: Secondary | ICD-10-CM | POA: Diagnosis not present

## 2021-06-04 DIAGNOSIS — M545 Low back pain, unspecified: Secondary | ICD-10-CM | POA: Diagnosis not present

## 2021-06-09 ENCOUNTER — Other Ambulatory Visit: Payer: Self-pay | Admitting: *Deleted

## 2021-06-09 DIAGNOSIS — D751 Secondary polycythemia: Secondary | ICD-10-CM

## 2021-06-15 DIAGNOSIS — M5136 Other intervertebral disc degeneration, lumbar region: Secondary | ICD-10-CM | POA: Diagnosis not present

## 2021-06-15 DIAGNOSIS — M8709 Idiopathic aseptic necrosis of bone, multiple sites: Secondary | ICD-10-CM | POA: Diagnosis not present

## 2021-06-15 DIAGNOSIS — M545 Low back pain, unspecified: Secondary | ICD-10-CM | POA: Diagnosis not present

## 2021-06-15 DIAGNOSIS — R9389 Abnormal findings on diagnostic imaging of other specified body structures: Secondary | ICD-10-CM | POA: Diagnosis not present

## 2021-06-15 DIAGNOSIS — M5416 Radiculopathy, lumbar region: Secondary | ICD-10-CM | POA: Diagnosis not present

## 2021-06-15 DIAGNOSIS — M62830 Muscle spasm of back: Secondary | ICD-10-CM | POA: Diagnosis not present

## 2021-06-15 DIAGNOSIS — M87852 Other osteonecrosis, left femur: Secondary | ICD-10-CM | POA: Diagnosis not present

## 2021-06-16 ENCOUNTER — Other Ambulatory Visit: Payer: Self-pay

## 2021-06-16 ENCOUNTER — Inpatient Hospital Stay: Payer: 59 | Attending: Internal Medicine

## 2021-06-16 ENCOUNTER — Inpatient Hospital Stay: Payer: 59

## 2021-06-16 ENCOUNTER — Encounter: Payer: Self-pay | Admitting: Internal Medicine

## 2021-06-16 ENCOUNTER — Inpatient Hospital Stay (HOSPITAL_BASED_OUTPATIENT_CLINIC_OR_DEPARTMENT_OTHER): Payer: 59 | Admitting: Internal Medicine

## 2021-06-16 VITALS — BP 155/89 | HR 62

## 2021-06-16 DIAGNOSIS — D751 Secondary polycythemia: Secondary | ICD-10-CM

## 2021-06-16 LAB — CBC WITH DIFFERENTIAL/PLATELET
Abs Immature Granulocytes: 0.11 10*3/uL — ABNORMAL HIGH (ref 0.00–0.07)
Basophils Absolute: 0.1 10*3/uL (ref 0.0–0.1)
Basophils Relative: 1 %
Eosinophils Absolute: 0.2 10*3/uL (ref 0.0–0.5)
Eosinophils Relative: 3 %
HCT: 47.6 % (ref 39.0–52.0)
Hemoglobin: 16.9 g/dL (ref 13.0–17.0)
Immature Granulocytes: 1 %
Lymphocytes Relative: 19 %
Lymphs Abs: 1.6 10*3/uL (ref 0.7–4.0)
MCH: 31.7 pg (ref 26.0–34.0)
MCHC: 35.5 g/dL (ref 30.0–36.0)
MCV: 89.3 fL (ref 80.0–100.0)
Monocytes Absolute: 0.9 10*3/uL (ref 0.1–1.0)
Monocytes Relative: 10 %
Neutro Abs: 5.8 10*3/uL (ref 1.7–7.7)
Neutrophils Relative %: 66 %
Platelets: 253 10*3/uL (ref 150–400)
RBC: 5.33 MIL/uL (ref 4.22–5.81)
RDW: 13.3 % (ref 11.5–15.5)
WBC: 8.7 10*3/uL (ref 4.0–10.5)
nRBC: 0 % (ref 0.0–0.2)

## 2021-06-16 LAB — COMPREHENSIVE METABOLIC PANEL
ALT: 26 U/L (ref 0–44)
AST: 19 U/L (ref 15–41)
Albumin: 4.6 g/dL (ref 3.5–5.0)
Alkaline Phosphatase: 53 U/L (ref 38–126)
Anion gap: 10 (ref 5–15)
BUN: 19 mg/dL (ref 6–20)
CO2: 28 mmol/L (ref 22–32)
Calcium: 9.5 mg/dL (ref 8.9–10.3)
Chloride: 100 mmol/L (ref 98–111)
Creatinine, Ser: 0.91 mg/dL (ref 0.61–1.24)
GFR, Estimated: >60 mL/min (ref >60)
Glucose, Bld: 91 mg/dL (ref 70–99)
Potassium: 3.4 mmol/L — ABNORMAL LOW (ref 3.5–5.1)
Sodium: 138 mmol/L (ref 135–145)
Total Bilirubin: 0.5 mg/dL (ref 0.3–1.2)
Total Protein: 7.7 g/dL (ref 6.5–8.1)

## 2021-06-16 NOTE — Assessment & Plan Note (Addendum)
#  Erythrocytosis-sec to testosterone. clinically asymptomatic; JAK-2 negative.  Today hematocrit is 47 [phlebotomy greater than 46 hematocrit]..  Proceed with phlebotomy given his ongoing testosterone use.  # DISPOSITION:  #  in 4 months-; labs- H&H; possible phlebotomy  # follow up in 4 months-MD; labs- H&H; possible phlebotomy-dr.B

## 2021-06-16 NOTE — Progress Notes (Signed)
Patient tolerated phlebotomy well. removed as ordered. HCT 47.6, however per MD proceed with phlebotomy despite current HCT level due to patient's inconsistent levels. Patient explained and aware. Accepted PO fluids. Discharged, no concerns. Stable.

## 2021-06-16 NOTE — Progress Notes (Signed)
[facility] NOTE  Patient Care Team: Biagio Borg, MD as PCP - General  CHIEF COMPLAINTS/PURPOSE OF CONSULTATION: ERYTHROCYTOSIS   HEMATOLOGY HISTORY  # ERYTHROCYTOSIS sec to testosterone [since 2017] [Dr.Stoioff] [Hb; WBC; platelets]- JAK-2 Negative  # Hypogonadism [100 injection- 5-6 years]; OSA/CPAP; social smoker  HISTORY OF PRESENTING ILLNESS:  Gabriel Moon 50 y.o.  male pleasant patient with erythrocytosis secondary to testosterone is here for follow-up.  Patient phlebotomy sometime in August 2022.  Patient denies any flushing.  Denies any worsening fatigue.  Denies any history of thromboembolic events.   Review of Systems  Constitutional:  Negative for chills, diaphoresis, fever, malaise/fatigue and weight loss.  HENT:  Negative for nosebleeds and sore throat.   Eyes:  Negative for double vision.  Respiratory:  Negative for cough, hemoptysis, sputum production, shortness of breath and wheezing.   Cardiovascular:  Negative for chest pain, palpitations, orthopnea and leg swelling.  Gastrointestinal:  Negative for abdominal pain, blood in stool, constipation, diarrhea, heartburn, melena, nausea and vomiting.  Genitourinary:  Negative for dysuria, frequency and urgency.  Musculoskeletal:  Positive for back pain. Negative for joint pain.  Skin: Negative.  Negative for itching and rash.  Neurological:  Negative for dizziness, tingling, focal weakness, weakness and headaches.  Endo/Heme/Allergies:  Does not bruise/bleed easily.  Psychiatric/Behavioral:  Negative for depression. The patient is not nervous/anxious and does not have insomnia.     MEDICAL HISTORY:  Past Medical History:  Diagnosis Date   ANXIETY 01/13/2007   Arthritis    OSTEO, MIDFOOT   Chronic LBP 04/19/2011   Headache    HYPERLIPIDEMIA 01/13/2007   Hypertension    LOST WEIGHT, NOT ON MEDS    NEPHROLITHIASIS, HX OF 04/26/2007   Rhinitis, allergic    Testosterone deficiency     MALE HYPOGONADISM    SURGICAL HISTORY: Past Surgical History:  Procedure Laterality Date   BONE EXOSTOSIS EXCISION Right 11/27/2014   Procedure: PARTIAL EXCISION OF TALUS;  Surgeon: Albertine Patricia, DPM;  Location: [place];  Service: Podiatry;  Laterality: Right;    SOCIAL HISTORY: Social History   Socioeconomic History   Marital status: Divorced    Spouse name: Not on file   Number of children: Not on file   Years of education: Not on file   Highest education level: Not on file  Occupational History   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     Social Determinants of Health   Financial Resource Strain: Not on file  Food Insecurity: Not on file  Transportation Needs: Not on file  Physical Activity: Not on file  Stress: Not on file  Social Connections: Not on file  Intimate Partner Violence: Not on file    FAMILY HISTORY: Family History  Problem Relation Age of Onset   Diabetes Mother    Prostate cancer Father    Migraines Father     ALLERGIES:  is allergic to effexor [venlafaxine] and lexapro [escitalopram oxalate].  MEDICATIONS:  Current Outpatient Medications  Medication Sig Dispense Refill   ALPRAZolam (XANAX) 0.5 MG tablet TAKE 1 TABLET BY MOUTH THREE TIMES DAILY AS NEEDED 90 tablet 2   amLODipine  (NORVASC) 10 MG tablet TAKE 1 TABLET(10 MG) BY MOUTH DAILY 90 tablet 3   atorvastatin (LIPITOR) 20 MG tablet TAKE 1 TABLET(20 MG) BY MOUTH DAILY 90 tablet 3   fluticasone (FLONASE) 50 MCG/ACT nasal spray SHAKE LIQUID AND USE 2 SPRAYS IN EACH NOSTRIL EVERY DAY 16 g 11   hydrochlorothiazide (MICROZIDE) 12.5 MG capsule TAKE 1 CAPSULE(12.5 MG) BY MOUTH DAILY 90 capsule 3   ibuprofen (ADVIL) 800 MG tablet Take 800 mg by mouth 2 (two) times daily.     montelukast (SINGULAIR) 10 MG tablet TAKE 1 TABLET(10 MG) BY MOUTH AT BEDTIME 90 tablet 2   telmisartan (MICARDIS) 80 MG tablet Take 1 tablet (80 mg total) by mouth daily. (Patient taking differently: Take 80 mg by mouth daily. Pt taking 1/2 tablet (40 mg daily).) 90 tablet 3   Testosterone Enanthate (XYOSTED) 100 MG/0.5ML SOAJ Inject 100 mg into the skin once a week. 2 mL 2   busPIRone (BUSPAR) 15 MG tablet Take 15 mg by mouth daily. Pt reports takes 1/2 in morning and 1/2 in evening (Patient not taking: Reported on 06/16/2021)     celecoxib (CELEBREX) 200 MG capsule Take 200 mg by mouth daily. (Patient not taking: Reported on 06/01/2021)     meloxicam (MOBIC) 15 MG tablet Take by mouth. (Patient not taking: Reported on 06/01/2021)     orphenadrine (NORFLEX) 100 MG tablet Take 1 tablet (100 mg total) by mouth 2 (two) times daily. (Patient not taking: Reported on 06/16/2021) 20 tablet 0   traZODone (DESYREL) 50 MG tablet Take 50 mg by mouth at bedtime. (Patient not taking: Reported on 06/01/2021)     No current facility-administered medications for this visit.     Marland Kitchen  PHYSICAL EXAMINATION:   Vitals:   06/16/21 1347  BP: (!) 133/94  Pulse: 73  Temp: 98.6 F (37 C)  SpO2: 98%   Filed Weights   06/16/21 1347  Weight: 222 lb 9.6 oz (101 kg)    Physical Exam HENT:     Head: Normocephalic and atraumatic.     Mouth/Throat:     Pharynx: No oropharyngeal exudate.  Eyes:     Pupils: Pupils are equal, round, and reactive to light.   Cardiovascular:     Rate and Rhythm: Normal rate and regular rhythm.  Pulmonary:     Effort: No respiratory distress.     Breath sounds: No wheezing.  Abdominal:     General: Bowel sounds are normal. There is no distension.     Palpations: Abdomen is soft. There is no mass.     Tenderness: There is no abdominal tenderness. There is no guarding or rebound.  Musculoskeletal:        General: No tenderness. Normal range of motion.     Cervical back: Normal range of motion and neck supple.  Skin:    General: Skin is warm.  Neurological:     Mental Status: He is alert and oriented to person, place, and time.  Psychiatric:  Mood and Affect: Affect normal.     LABORATORY DATA:  I have reviewed the data as listed Lab Results  Component Value Date   WBC 8.7 06/16/2021   HGB 16.9 06/16/2021   HCT 47.6 06/16/2021   MCV 89.3 06/16/2021   PLT 253 06/16/2021   Recent Labs    11/04/20 0848 02/09/21 1235 06/16/21 1331  NA 140 136 138  K 3.9 3.7 3.4*  CL 104 100 100  CO2 28 27 28   GLUCOSE 88 114* 91  BUN 16 16 19   CREATININE 1.01 1.14 0.91  CALCIUM 9.4 9.7 9.5  GFRNONAA  --  >60 >60  PROT 6.7 7.9 7.7  ALBUMIN 4.3 4.7 4.6  AST 15 21 19   ALT 26 34 26  ALKPHOS 55 65 53  BILITOT 0.5 0.8 0.5  BILIDIR 0.1  --   --      No results found.  ASSESSMENT & PLAN:   Erythrocytosis #Erythrocytosis-sec to testosterone. clinically asymptomatic; JAK-2 negative.  Today hematocrit is 47 [phlebotomy greater than 46 hematocrit]..  Proceed with phlebotomy given his ongoing testosterone use.   # DISPOSITION:  #  in 4 months-; labs- H&H; possible phlebotomy  # follow up in 4 months-MD; labs- H&H; possible phlebotomy-dr.B     , MD 06/16/2021 3:29 PM

## 2021-06-16 NOTE — Patient Instructions (Signed)

## 2021-06-23 DIAGNOSIS — M9902 Segmental and somatic dysfunction of thoracic region: Secondary | ICD-10-CM | POA: Diagnosis not present

## 2021-06-23 DIAGNOSIS — M9904 Segmental and somatic dysfunction of sacral region: Secondary | ICD-10-CM | POA: Diagnosis not present

## 2021-06-23 DIAGNOSIS — S336XXA Sprain of sacroiliac joint, initial encounter: Secondary | ICD-10-CM | POA: Diagnosis not present

## 2021-06-23 DIAGNOSIS — M9903 Segmental and somatic dysfunction of lumbar region: Secondary | ICD-10-CM | POA: Diagnosis not present

## 2021-06-23 DIAGNOSIS — M5417 Radiculopathy, lumbosacral region: Secondary | ICD-10-CM | POA: Diagnosis not present

## 2021-06-24 DIAGNOSIS — M9902 Segmental and somatic dysfunction of thoracic region: Secondary | ICD-10-CM | POA: Diagnosis not present

## 2021-06-24 DIAGNOSIS — M9904 Segmental and somatic dysfunction of sacral region: Secondary | ICD-10-CM | POA: Diagnosis not present

## 2021-06-24 DIAGNOSIS — M5417 Radiculopathy, lumbosacral region: Secondary | ICD-10-CM | POA: Diagnosis not present

## 2021-06-24 DIAGNOSIS — M9903 Segmental and somatic dysfunction of lumbar region: Secondary | ICD-10-CM | POA: Diagnosis not present

## 2021-06-24 DIAGNOSIS — S336XXA Sprain of sacroiliac joint, initial encounter: Secondary | ICD-10-CM | POA: Diagnosis not present

## 2021-06-29 DIAGNOSIS — M5417 Radiculopathy, lumbosacral region: Secondary | ICD-10-CM | POA: Diagnosis not present

## 2021-06-29 DIAGNOSIS — M9904 Segmental and somatic dysfunction of sacral region: Secondary | ICD-10-CM | POA: Diagnosis not present

## 2021-06-29 DIAGNOSIS — S336XXA Sprain of sacroiliac joint, initial encounter: Secondary | ICD-10-CM | POA: Diagnosis not present

## 2021-06-29 DIAGNOSIS — M9902 Segmental and somatic dysfunction of thoracic region: Secondary | ICD-10-CM | POA: Diagnosis not present

## 2021-06-29 DIAGNOSIS — M9903 Segmental and somatic dysfunction of lumbar region: Secondary | ICD-10-CM | POA: Diagnosis not present

## 2021-06-30 DIAGNOSIS — S336XXA Sprain of sacroiliac joint, initial encounter: Secondary | ICD-10-CM | POA: Diagnosis not present

## 2021-06-30 DIAGNOSIS — M5417 Radiculopathy, lumbosacral region: Secondary | ICD-10-CM | POA: Diagnosis not present

## 2021-06-30 DIAGNOSIS — M9903 Segmental and somatic dysfunction of lumbar region: Secondary | ICD-10-CM | POA: Diagnosis not present

## 2021-06-30 DIAGNOSIS — M9904 Segmental and somatic dysfunction of sacral region: Secondary | ICD-10-CM | POA: Diagnosis not present

## 2021-06-30 DIAGNOSIS — M9902 Segmental and somatic dysfunction of thoracic region: Secondary | ICD-10-CM | POA: Diagnosis not present

## 2021-07-01 DIAGNOSIS — M5417 Radiculopathy, lumbosacral region: Secondary | ICD-10-CM | POA: Diagnosis not present

## 2021-07-01 DIAGNOSIS — S336XXA Sprain of sacroiliac joint, initial encounter: Secondary | ICD-10-CM | POA: Diagnosis not present

## 2021-07-01 DIAGNOSIS — M9902 Segmental and somatic dysfunction of thoracic region: Secondary | ICD-10-CM | POA: Diagnosis not present

## 2021-07-01 DIAGNOSIS — M9904 Segmental and somatic dysfunction of sacral region: Secondary | ICD-10-CM | POA: Diagnosis not present

## 2021-07-01 DIAGNOSIS — M9903 Segmental and somatic dysfunction of lumbar region: Secondary | ICD-10-CM | POA: Diagnosis not present

## 2021-07-07 DIAGNOSIS — S336XXA Sprain of sacroiliac joint, initial encounter: Secondary | ICD-10-CM | POA: Diagnosis not present

## 2021-07-07 DIAGNOSIS — M9903 Segmental and somatic dysfunction of lumbar region: Secondary | ICD-10-CM | POA: Diagnosis not present

## 2021-07-07 DIAGNOSIS — M9902 Segmental and somatic dysfunction of thoracic region: Secondary | ICD-10-CM | POA: Diagnosis not present

## 2021-07-07 DIAGNOSIS — M9904 Segmental and somatic dysfunction of sacral region: Secondary | ICD-10-CM | POA: Diagnosis not present

## 2021-07-08 DIAGNOSIS — M9903 Segmental and somatic dysfunction of lumbar region: Secondary | ICD-10-CM | POA: Diagnosis not present

## 2021-07-08 DIAGNOSIS — M9904 Segmental and somatic dysfunction of sacral region: Secondary | ICD-10-CM | POA: Diagnosis not present

## 2021-07-08 DIAGNOSIS — M9902 Segmental and somatic dysfunction of thoracic region: Secondary | ICD-10-CM | POA: Diagnosis not present

## 2021-07-08 DIAGNOSIS — S336XXA Sprain of sacroiliac joint, initial encounter: Secondary | ICD-10-CM | POA: Diagnosis not present

## 2021-07-09 DIAGNOSIS — M545 Low back pain, unspecified: Secondary | ICD-10-CM | POA: Diagnosis not present

## 2021-07-09 DIAGNOSIS — G8929 Other chronic pain: Secondary | ICD-10-CM | POA: Diagnosis not present

## 2021-07-09 DIAGNOSIS — M87051 Idiopathic aseptic necrosis of right femur: Secondary | ICD-10-CM | POA: Diagnosis not present

## 2021-07-09 DIAGNOSIS — M87052 Idiopathic aseptic necrosis of left femur: Secondary | ICD-10-CM | POA: Diagnosis not present

## 2021-07-12 DIAGNOSIS — S336XXA Sprain of sacroiliac joint, initial encounter: Secondary | ICD-10-CM | POA: Diagnosis not present

## 2021-07-12 DIAGNOSIS — M9903 Segmental and somatic dysfunction of lumbar region: Secondary | ICD-10-CM | POA: Diagnosis not present

## 2021-07-12 DIAGNOSIS — M9904 Segmental and somatic dysfunction of sacral region: Secondary | ICD-10-CM | POA: Diagnosis not present

## 2021-07-12 DIAGNOSIS — M9902 Segmental and somatic dysfunction of thoracic region: Secondary | ICD-10-CM | POA: Diagnosis not present

## 2021-07-13 ENCOUNTER — Other Ambulatory Visit: Payer: Self-pay | Admitting: Family Medicine

## 2021-07-13 DIAGNOSIS — E291 Testicular hypofunction: Secondary | ICD-10-CM

## 2021-07-13 MED ORDER — XYOSTED 100 MG/0.5ML ~~LOC~~ SOAJ: 100.0000 mg | SUBCUTANEOUS | 2 refills | Status: DC

## 2021-07-14 DIAGNOSIS — S336XXA Sprain of sacroiliac joint, initial encounter: Secondary | ICD-10-CM | POA: Diagnosis not present

## 2021-07-14 DIAGNOSIS — M9902 Segmental and somatic dysfunction of thoracic region: Secondary | ICD-10-CM | POA: Diagnosis not present

## 2021-07-14 DIAGNOSIS — M9903 Segmental and somatic dysfunction of lumbar region: Secondary | ICD-10-CM | POA: Diagnosis not present

## 2021-07-14 DIAGNOSIS — M9904 Segmental and somatic dysfunction of sacral region: Secondary | ICD-10-CM | POA: Diagnosis not present

## 2021-07-20 DIAGNOSIS — S336XXA Sprain of sacroiliac joint, initial encounter: Secondary | ICD-10-CM | POA: Diagnosis not present

## 2021-07-20 DIAGNOSIS — M9903 Segmental and somatic dysfunction of lumbar region: Secondary | ICD-10-CM | POA: Diagnosis not present

## 2021-07-20 DIAGNOSIS — M9902 Segmental and somatic dysfunction of thoracic region: Secondary | ICD-10-CM | POA: Diagnosis not present

## 2021-07-20 DIAGNOSIS — M9904 Segmental and somatic dysfunction of sacral region: Secondary | ICD-10-CM | POA: Diagnosis not present

## 2021-07-30 ENCOUNTER — Other Ambulatory Visit: Payer: Self-pay | Admitting: Family Medicine

## 2021-07-30 DIAGNOSIS — M62830 Muscle spasm of back: Secondary | ICD-10-CM | POA: Diagnosis not present

## 2021-07-30 DIAGNOSIS — M545 Low back pain, unspecified: Secondary | ICD-10-CM | POA: Diagnosis not present

## 2021-07-30 DIAGNOSIS — M5416 Radiculopathy, lumbar region: Secondary | ICD-10-CM | POA: Diagnosis not present

## 2021-07-30 DIAGNOSIS — M5136 Other intervertebral disc degeneration, lumbar region: Secondary | ICD-10-CM | POA: Diagnosis not present

## 2021-08-02 ENCOUNTER — Other Ambulatory Visit: Payer: Self-pay | Admitting: Family Medicine

## 2021-08-02 DIAGNOSIS — M5416 Radiculopathy, lumbar region: Secondary | ICD-10-CM

## 2021-08-04 DIAGNOSIS — M9904 Segmental and somatic dysfunction of sacral region: Secondary | ICD-10-CM | POA: Diagnosis not present

## 2021-08-04 DIAGNOSIS — M9903 Segmental and somatic dysfunction of lumbar region: Secondary | ICD-10-CM | POA: Diagnosis not present

## 2021-08-04 DIAGNOSIS — S336XXA Sprain of sacroiliac joint, initial encounter: Secondary | ICD-10-CM | POA: Diagnosis not present

## 2021-08-04 DIAGNOSIS — M9902 Segmental and somatic dysfunction of thoracic region: Secondary | ICD-10-CM | POA: Diagnosis not present

## 2021-08-06 ENCOUNTER — Other Ambulatory Visit: Payer: Self-pay

## 2021-08-06 ENCOUNTER — Ambulatory Visit
Admission: RE | Admit: 2021-08-06 | Discharge: 2021-08-06 | Disposition: A | Discharge status: Home / Self Care | Payer: 59 | Source: Ambulatory Visit | Attending: Family Medicine | Admitting: Family Medicine

## 2021-08-06 DIAGNOSIS — M545 Low back pain, unspecified: Secondary | ICD-10-CM | POA: Diagnosis not present

## 2021-08-06 DIAGNOSIS — M48061 Spinal stenosis, lumbar region without neurogenic claudication: Secondary | ICD-10-CM | POA: Diagnosis not present

## 2021-08-06 DIAGNOSIS — M5416 Radiculopathy, lumbar region: Secondary | ICD-10-CM

## 2021-08-10 ENCOUNTER — Other Ambulatory Visit: Payer: Self-pay | Admitting: Internal Medicine

## 2021-08-10 NOTE — Telephone Encounter (Signed)
Please refill as per office routine med refill policy (all routine meds to be refilled for 3 mo or monthly (per pt preference) up to one year from last visit, then month to month grace period for 3 mo, then further med refills will have to be denied) ? ?

## 2021-08-11 DIAGNOSIS — M5126 Other intervertebral disc displacement, lumbar region: Secondary | ICD-10-CM | POA: Diagnosis not present

## 2021-08-11 DIAGNOSIS — M5136 Other intervertebral disc degeneration, lumbar region: Secondary | ICD-10-CM | POA: Diagnosis not present

## 2021-08-11 DIAGNOSIS — Z79899 Other long term (current) drug therapy: Secondary | ICD-10-CM | POA: Diagnosis not present

## 2021-08-11 DIAGNOSIS — M62830 Muscle spasm of back: Secondary | ICD-10-CM | POA: Diagnosis not present

## 2021-08-11 DIAGNOSIS — M545 Low back pain, unspecified: Secondary | ICD-10-CM | POA: Diagnosis not present

## 2021-08-18 DIAGNOSIS — M9902 Segmental and somatic dysfunction of thoracic region: Secondary | ICD-10-CM | POA: Diagnosis not present

## 2021-08-18 DIAGNOSIS — M9903 Segmental and somatic dysfunction of lumbar region: Secondary | ICD-10-CM | POA: Diagnosis not present

## 2021-08-18 DIAGNOSIS — S336XXA Sprain of sacroiliac joint, initial encounter: Secondary | ICD-10-CM | POA: Diagnosis not present

## 2021-08-18 DIAGNOSIS — M9904 Segmental and somatic dysfunction of sacral region: Secondary | ICD-10-CM | POA: Diagnosis not present

## 2021-08-20 DIAGNOSIS — M5126 Other intervertebral disc displacement, lumbar region: Secondary | ICD-10-CM | POA: Diagnosis not present

## 2021-08-20 DIAGNOSIS — M5416 Radiculopathy, lumbar region: Secondary | ICD-10-CM | POA: Diagnosis not present

## 2021-08-30 ENCOUNTER — Encounter: Payer: Self-pay | Admitting: Internal Medicine

## 2021-09-01 DIAGNOSIS — M9902 Segmental and somatic dysfunction of thoracic region: Secondary | ICD-10-CM | POA: Diagnosis not present

## 2021-09-01 DIAGNOSIS — M9903 Segmental and somatic dysfunction of lumbar region: Secondary | ICD-10-CM | POA: Diagnosis not present

## 2021-09-01 DIAGNOSIS — M9904 Segmental and somatic dysfunction of sacral region: Secondary | ICD-10-CM | POA: Diagnosis not present

## 2021-09-01 DIAGNOSIS — S336XXA Sprain of sacroiliac joint, initial encounter: Secondary | ICD-10-CM | POA: Diagnosis not present

## 2021-09-01 MED ORDER — ATORVASTATIN CALCIUM 20 MG PO TABS: ORAL_TABLET | ORAL | 0 refills | Status: DC

## 2021-09-01 MED ORDER — AMLODIPINE BESYLATE 10 MG PO TABS: ORAL_TABLET | ORAL | 0 refills | Status: DC

## 2021-09-09 ENCOUNTER — Other Ambulatory Visit: Payer: Self-pay

## 2021-09-09 ENCOUNTER — Ambulatory Visit: Payer: Self-pay

## 2021-09-09 DIAGNOSIS — Z0110 Encounter for examination of ears and hearing without abnormal findings: Secondary | ICD-10-CM

## 2021-09-09 NOTE — Progress Notes (Signed)
Presents to [facility] for annual hearing screen.  Works for [employer] is part of the [employer]. ? ?AMD ?

## 2021-09-13 DIAGNOSIS — M5136 Other intervertebral disc degeneration, lumbar region: Secondary | ICD-10-CM | POA: Diagnosis not present

## 2021-09-13 DIAGNOSIS — R81 Glycosuria: Secondary | ICD-10-CM | POA: Diagnosis not present

## 2021-09-13 DIAGNOSIS — M5416 Radiculopathy, lumbar region: Secondary | ICD-10-CM | POA: Diagnosis not present

## 2021-09-13 DIAGNOSIS — Z79899 Other long term (current) drug therapy: Secondary | ICD-10-CM | POA: Diagnosis not present

## 2021-09-13 DIAGNOSIS — M5126 Other intervertebral disc displacement, lumbar region: Secondary | ICD-10-CM | POA: Diagnosis not present

## 2021-09-14 DIAGNOSIS — M65871 Other synovitis and tenosynovitis, right ankle and foot: Secondary | ICD-10-CM | POA: Diagnosis not present

## 2021-09-14 DIAGNOSIS — H5213 Myopia, bilateral: Secondary | ICD-10-CM | POA: Diagnosis not present

## 2021-09-14 DIAGNOSIS — M19171 Post-traumatic osteoarthritis, right ankle and foot: Secondary | ICD-10-CM | POA: Diagnosis not present

## 2021-09-14 DIAGNOSIS — H43811 Vitreous degeneration, right eye: Secondary | ICD-10-CM | POA: Diagnosis not present

## 2021-09-23 DIAGNOSIS — M5416 Radiculopathy, lumbar region: Secondary | ICD-10-CM | POA: Diagnosis not present

## 2021-09-23 DIAGNOSIS — M5126 Other intervertebral disc displacement, lumbar region: Secondary | ICD-10-CM | POA: Diagnosis not present

## 2021-09-25 ENCOUNTER — Other Ambulatory Visit: Payer: Self-pay | Admitting: Urology

## 2021-09-25 DIAGNOSIS — E291 Testicular hypofunction: Secondary | ICD-10-CM

## 2021-10-04 ENCOUNTER — Other Ambulatory Visit: Payer: Self-pay | Admitting: *Deleted

## 2021-10-04 DIAGNOSIS — E291 Testicular hypofunction: Secondary | ICD-10-CM

## 2021-10-04 DIAGNOSIS — N200 Calculus of kidney: Secondary | ICD-10-CM

## 2021-10-05 ENCOUNTER — Other Ambulatory Visit: Payer: 59

## 2021-10-05 ENCOUNTER — Ambulatory Visit
Admission: RE | Admit: 2021-10-05 | Discharge: 2021-10-05 | Disposition: A | Discharge status: Home / Self Care | Payer: 59 | Attending: Urology | Admitting: Urology

## 2021-10-05 ENCOUNTER — Ambulatory Visit
Admission: RE | Admit: 2021-10-05 | Discharge: 2021-10-05 | Disposition: A | Discharge status: Home / Self Care | Payer: 59 | Source: Ambulatory Visit | Attending: Urology | Admitting: Urology

## 2021-10-05 DIAGNOSIS — E291 Testicular hypofunction: Secondary | ICD-10-CM

## 2021-10-05 DIAGNOSIS — N2889 Other specified disorders of kidney and ureter: Secondary | ICD-10-CM | POA: Diagnosis not present

## 2021-10-05 DIAGNOSIS — Z87442 Personal history of urinary calculi: Secondary | ICD-10-CM | POA: Diagnosis not present

## 2021-10-05 DIAGNOSIS — N200 Calculus of kidney: Secondary | ICD-10-CM

## 2021-10-05 DIAGNOSIS — I878 Other specified disorders of veins: Secondary | ICD-10-CM | POA: Diagnosis not present

## 2021-10-06 ENCOUNTER — Encounter: Payer: Self-pay | Admitting: Urology

## 2021-10-06 ENCOUNTER — Ambulatory Visit (INDEPENDENT_AMBULATORY_CARE_PROVIDER_SITE_OTHER): Payer: 59 | Admitting: Urology

## 2021-10-06 VITALS — BP 166/100 | HR 91 | Ht 68.0 in | Wt 225.0 lb

## 2021-10-06 DIAGNOSIS — M170 Bilateral primary osteoarthritis of knee: Secondary | ICD-10-CM | POA: Diagnosis not present

## 2021-10-06 DIAGNOSIS — E291 Testicular hypofunction: Secondary | ICD-10-CM | POA: Diagnosis not present

## 2021-10-06 DIAGNOSIS — D751 Secondary polycythemia: Secondary | ICD-10-CM | POA: Diagnosis not present

## 2021-10-06 DIAGNOSIS — S63651A Sprain of metacarpophalangeal joint of left index finger, initial encounter: Secondary | ICD-10-CM | POA: Diagnosis not present

## 2021-10-06 DIAGNOSIS — N200 Calculus of kidney: Secondary | ICD-10-CM | POA: Diagnosis not present

## 2021-10-06 LAB — HEMATOCRIT: Hematocrit: 55.3 % — ABNORMAL HIGH (ref 37.5–51.0)

## 2021-10-06 LAB — TESTOSTERONE: Testosterone: 382 ng/dL (ref 264–916)

## 2021-10-06 LAB — PSA: Prostate Specific Ag, Serum: 0.8 ng/mL (ref 0.0–4.0)

## 2021-10-06 NOTE — Progress Notes (Signed)
? ?10/06/2021 ?8:10 AM  ? ?Gabriel Moon ?Jul 27, 1970 ?[place]:[id number] ? ?Referring provider: Biagio Borg, MD ?[name],  [place] [address] ? ?Chief Complaint  ?Patient presents with  ? Nephrolithiasis  ? ?Urologic history: ?1.  Hypogonadism ?Symptoms tiredness, fatigue ?Xyosted 100 mg weekly ?Erythrocytosis followed by hematology ? ?2.  Recurrent nephrolithiasis ? ? ?HPI: ?51 y.o. male presents for annual follow-up. ? ?Since last years visit he has passed 2 small stones ?Doing well on Xyosted ?Labs 10/05/2021: Testosterone 382 ng/dL (trough); PSA 0.8; HCT 55.3 ?Sees Dr. Rogue Bussing to 4 months and is scheduled to see him 10/13/2021 ? ? ?PMH: ?Past Medical History:  ?Diagnosis Date  ? ANXIETY 01/13/2007  ? Arthritis   ? OSTEO, MIDFOOT  ? Chronic LBP 04/19/2011  ? Headache   ? HYPERLIPIDEMIA 01/13/2007  ? Hypertension   ? LOST WEIGHT, NOT ON MEDS   ? NEPHROLITHIASIS, HX OF 04/26/2007  ? Rhinitis, allergic   ? Testosterone deficiency   ? MALE HYPOGONADISM  ? ? ?Surgical History: ?Past Surgical History:  ?Procedure Laterality Date  ? BONE EXOSTOSIS EXCISION Right 11/27/2014  ? Procedure: PARTIAL EXCISION OF TALUS;  Surgeon: Albertine Patricia, DPM;  Location: [facility];  Service: Podiatry;  Laterality: Right;  ? ? ?Home Medications:  ?Allergies as of 10/06/2021   ? ?   Reactions  ? Effexor [venlafaxine] Other (See Comments)  ? Blurry vision, fatgue  ? Lexapro [escitalopram Oxalate]   ? BAD REACTION-SUICIDAL  ? ?  ? ?  ?Medication List  ?  ? ?  ? Accurate as of October 06, 2021  8:10 AM. If you have any questions, ask your nurse or doctor.  ?  ?  ? ?  ? ?STOP taking these medications   ? ?busPIRone 15 MG tablet ?Commonly known as: BUSPAR ?Stopped by: Abbie Sons, MD ?  ?ibuprofen 800 MG tablet ?Commonly known as: ADVIL ?Stopped by: Abbie Sons, MD ?  ? ?  ? ?TAKE these medications   ? ?ALPRAZolam 0.5 MG tablet ?Commonly known as: Duanne Moron ?TAKE 1 TABLET BY MOUTH THREE TIMES DAILY AS NEEDED ?  ?amLODipine 10  MG tablet ?Commonly known as: NORVASC ?TAKE 1 TABLET(10 MG) BY MOUTH DAILY ?  ?atorvastatin 20 MG tablet ?Commonly known as: LIPITOR ?TAKE 1 TABLET(20 MG) BY MOUTH DAILY ?  ?celecoxib 200 MG capsule ?Commonly known as: CELEBREX ?Take 200 mg by mouth daily. ?  ?fluticasone 50 MCG/ACT nasal spray ?Commonly known as: FLONASE ?SHAKE LIQUID AND USE 2 SPRAYS IN EACH NOSTRIL EVERY DAY ?  ?hydrochlorothiazide 12.5 MG capsule ?Commonly known as: MICROZIDE ?TAKE 1 CAPSULE(12.5 MG) BY MOUTH DAILY ?  ?meloxicam 15 MG tablet ?Commonly known as: MOBIC ?Take by mouth. ?  ?montelukast 10 MG tablet ?Commonly known as: SINGULAIR ?TAKE 1 TABLET(10 MG) BY MOUTH AT BEDTIME ?  ?orphenadrine 100 MG tablet ?Commonly known as: NORFLEX ?Take 1 tablet (100 mg total) by mouth 2 (two) times daily. ?  ?telmisartan 80 MG tablet ?Commonly known as: Micardis ?Take 1 tablet (80 mg total) by mouth daily. ?What changed: additional instructions ?  ?traZODone 50 MG tablet ?Commonly known as: DESYREL ?Take 50 mg by mouth at bedtime. ?  ?Xyosted 100 MG/0.5ML Soaj ?Generic drug: Testosterone Enanthate ?INJECT 1 PEN SUBCUTANEOUSLY ONCE EVERY WEEK ?  ? ?  ? ? ?Allergies:  ?Allergies  ?Allergen Reactions  ? Effexor [Venlafaxine] Other (See Comments)  ?  Blurry vision, fatgue  ? Lexapro [Escitalopram Oxalate]   ?  BAD REACTION-SUICIDAL  ? ? ?Family History: ?Family History  ?Problem Relation Age of Onset  ? Diabetes Mother   ? Prostate cancer Father   ? Migraines Father   ? ? ?Social History:  reports that he quit smoking about 19 years ago. His smoking use included cigarettes. He has a 15.00 pack-year smoking history. He has never used smokeless tobacco. He reports current alcohol use of about 2.0 standard drinks per week. He reports that he does not use drugs. ? ? ?Physical Exam: ?BP (!) 166/100   Pulse 91   Ht 5\' 8"  (1.727 m)   Wt 225 lb (102.1 kg)   BMI 34.21 kg/m?   ?Constitutional:  Alert and oriented, No acute distress. ?HEENT: [place] AT, moist mucus  membranes.  Trachea midline, no masses. ?Cardiovascular: No clubbing, cyanosis, or edema. ?Respiratory: Normal respiratory effort, no increased work of breathing. ?Skin: No rashes, bruises or suspicious lesions. ?Neurologic: Grossly intact, no focal deficits, moving all 4 extremities. ?Psychiatric: Normal mood and affect. ? ? ?Pertinent Imaging: ?Images from a KUB performed yesterday were personally reviewed and interpreted.  4-5 left renal calculi.  Multiple pelvic phleboliths ? ? ?Assessment & Plan:   ? ?1.  Hypogonadism ?Doing well on TRT ?Lab visit 6 months testosterone ?Office visit 1 year testosterone, PSA ?Hematocrit followed by hematology ?Xyosted refilled ? ?2.  Recurrent nephrolithiasis ?We discussed a metabolic evaluation and he is interested in pursuing ? ? ? ?Abbie Sons, MD ? ?[facility] ?[address], [address] ?[place], [place] [address] ?[phone] ? ?

## 2021-10-06 NOTE — Patient Instructions (Signed)
[facility] Instructions ?[facility] ?  ?You will receive a box/kit in the mail that will have a urine jug and instructions in the kit.  When the box arrives you will need to call our office [phone] to schedule a LAB appointment. ?  ?You will need to do a 24hour urine and this should be done during the days that our office will be open.  For example any day from Sunday through Thursday. ?  ?If you take Vitamin C 186m or greater please stop this 5 days prior to collection. ?  ?How to collect the urine sample: On the day you start the urine sample this 1st morning urine should NOT be collected.  For the rest of the day including all night urines should be collected.  On the next morning the 1st urine should be collected and then you will be finished with the urine collections. ?  ?You will need to bring the box with you on your LAB appointment day after urine has been collected and all instructions are complete in the box.  Your blood will be drawn and the box will be collected by our Lab employee to be sent off for analysis. ?  ?When urine and blood is complete you will need to schedule a follow up appointment for lab results.  ?

## 2021-10-07 ENCOUNTER — Encounter: Payer: Self-pay | Admitting: Internal Medicine

## 2021-10-08 ENCOUNTER — Encounter: Payer: Self-pay | Admitting: *Deleted

## 2021-10-11 ENCOUNTER — Encounter: Payer: Self-pay | Admitting: Internal Medicine

## 2021-10-11 MED ORDER — HYDROCHLOROTHIAZIDE 12.5 MG PO CAPS: ORAL_CAPSULE | ORAL | 0 refills | Status: DC

## 2021-10-12 DIAGNOSIS — M5136 Other intervertebral disc degeneration, lumbar region: Secondary | ICD-10-CM | POA: Diagnosis not present

## 2021-10-12 DIAGNOSIS — M5126 Other intervertebral disc displacement, lumbar region: Secondary | ICD-10-CM | POA: Diagnosis not present

## 2021-10-12 DIAGNOSIS — M48062 Spinal stenosis, lumbar region with neurogenic claudication: Secondary | ICD-10-CM | POA: Diagnosis not present

## 2021-10-12 DIAGNOSIS — M5416 Radiculopathy, lumbar region: Secondary | ICD-10-CM | POA: Diagnosis not present

## 2021-10-13 ENCOUNTER — Other Ambulatory Visit: Payer: 59

## 2021-10-13 ENCOUNTER — Inpatient Hospital Stay: Payer: 59 | Attending: Internal Medicine

## 2021-10-13 VITALS — BP 120/81 | HR 72 | Temp 99.0°F

## 2021-10-13 DIAGNOSIS — D751 Secondary polycythemia: Secondary | ICD-10-CM

## 2021-10-13 NOTE — Patient Instructions (Signed)

## 2021-10-13 NOTE — Progress Notes (Unsigned)
500 ml phlebotomy performed per MD . No lab required today. Pt tolerated procedure well. Accepted a beverage. VSS. Discharged to home. ?

## 2021-11-09 ENCOUNTER — Ambulatory Visit (INDEPENDENT_AMBULATORY_CARE_PROVIDER_SITE_OTHER): Payer: 59 | Admitting: Internal Medicine

## 2021-11-09 VITALS — BP 122/78 | HR 84 | Temp 98.4°F | Ht 68.0 in | Wt 221.0 lb

## 2021-11-09 DIAGNOSIS — E538 Deficiency of other specified B group vitamins: Secondary | ICD-10-CM | POA: Diagnosis not present

## 2021-11-09 DIAGNOSIS — Z0000 Encounter for general adult medical examination without abnormal findings: Secondary | ICD-10-CM

## 2021-11-09 DIAGNOSIS — R69 Illness, unspecified: Secondary | ICD-10-CM | POA: Diagnosis not present

## 2021-11-09 DIAGNOSIS — Z125 Encounter for screening for malignant neoplasm of prostate: Secondary | ICD-10-CM

## 2021-11-09 DIAGNOSIS — E6609 Other obesity due to excess calories: Secondary | ICD-10-CM | POA: Diagnosis not present

## 2021-11-09 DIAGNOSIS — F419 Anxiety disorder, unspecified: Secondary | ICD-10-CM | POA: Diagnosis not present

## 2021-11-09 DIAGNOSIS — E559 Vitamin D deficiency, unspecified: Secondary | ICD-10-CM

## 2021-11-09 DIAGNOSIS — E7800 Pure hypercholesterolemia, unspecified: Secondary | ICD-10-CM | POA: Diagnosis not present

## 2021-11-09 DIAGNOSIS — R739 Hyperglycemia, unspecified: Secondary | ICD-10-CM

## 2021-11-09 MED ORDER — WEGOVY 0.5 MG/0.5ML ~~LOC~~ SOAJ: 0.5000 mg | SUBCUTANEOUS | 11 refills | Status: DC

## 2021-11-09 MED ORDER — ALPRAZOLAM 0.5 MG PO TABS: 0.5000 mg | ORAL_TABLET | Freq: Three times a day (TID) | ORAL | 2 refills | Status: DC | PRN

## 2021-11-09 MED ORDER — HYDROCHLOROTHIAZIDE 12.5 MG PO CAPS: ORAL_CAPSULE | ORAL | 3 refills | Status: DC

## 2021-11-09 NOTE — Patient Instructions (Signed)
Please take all new medication as prescribed- the wegovy if ok with the insurance  Please continue all other medications as before, and refills have been done if requested.  Please have the pharmacy call with any other refills you may need.  Please continue your efforts at being more active, low cholesterol diet, and weight control.  You are otherwise up to date with prevention measures today.  Please keep your appointments with your specialists as you may have planned  Please make an Appointment to return for your 1 year visit, or sooner if needed, with Lab testing by Appointment as well, to be done about 3-5 days before at the FIRST FLOOR Lab (so this is for TWO appointments - please see the scheduling desk as you leave)   Due to the ongoing Covid 19 pandemic, our lab now requires an appointment for any labs done at our office.  If you need labs done and do not have an appointment, please call our office ahead of time to schedule before presenting to the lab for your testing.

## 2021-11-09 NOTE — Progress Notes (Signed)
Patient ID: Gabriel Moon, male   DOB: 04-20-1971, 51 y.o.   MRN: [id number]         Chief Complaint:: wellness exam and obesity, low vit , hld, hyperglycemia, anxiety       HPI:  Gabriel Mallingaul C Mclane is a 51 y.o. male here for wellness exam; decliens covid booster, colonoscopy                       Also Pt denies chest pain, increased sob or doe, wheezing, orthopnea, PND, increased LE swelling, palpitations, dizziness or syncope.   Pt denies polydipsia, polyuria, or new focal neuro s/s.    Pt denies fever, wt loss, night sweats, loss of appetite, or other constitutional symptoms  Has not been able to lose wt with less calories and increased activity except for a few lbs   Wt down with moderation in diet.   Not taking Vit D.  Denies worsening depressive symptoms, suicidal ideation, or panic; has ongoing anxiety, asks for benzo refill Wt Readings from Last 3 Encounters:  11/09/21 221 lb (100.2 kg)  10/06/21 225 lb (102.1 kg)  06/16/21 222 lb 9.6 oz (101 kg)   BP Readings from Last 3 Encounters:  11/09/21 122/78  10/13/21 (P) 134/86  10/06/21 (!) 166/100   Immunization History  Administered Date(s) Administered   Hepatitis B 11/18/2008, 12/18/2008, 05/25/2009   Td 06/20/2000   Tdap 02/03/2016  There are no preventive care reminders to display for this patient.    Past Medical History:  Diagnosis Date   ANXIETY 01/13/2007   Arthritis    OSTEO, MIDFOOT   Chronic LBP 04/19/2011   Headache    HYPERLIPIDEMIA 01/13/2007   Hypertension    LOST WEIGHT, NOT ON MEDS    NEPHROLITHIASIS, HX OF 04/26/2007   Rhinitis, allergic    Testosterone deficiency    MALE HYPOGONADISM   Past Surgical History:  Procedure Laterality Date   BONE EXOSTOSIS EXCISION Right 11/27/2014   Procedure: PARTIAL EXCISION OF TALUS;  Surgeon: Recardo EvangelistMatthew Troxler, DPM;  [facility];  Service: Podiatry;  Laterality: Right;    reports that he quit smoking about 19 years ago. His smoking use included  cigarettes. He has a 15.00 pack-year smoking history. He has never used smokeless tobacco. He reports current alcohol use of about 2.0 standard drinks per week. He reports that he does not use drugs. family history includes Diabetes in his mother; Migraines in his father; Prostate cancer in his father. Allergies  Allergen Reactions   Effexor [Venlafaxine] Other (See Comments)    Blurry vision, fatgue   Lexapro [Escitalopram Oxalate]     BAD REACTION-SUICIDAL   Current Outpatient Medications on File Prior to Visit  Medication Sig Dispense Refill   amLODipine (NORVASC) 10 MG tablet TAKE 1 TABLET(10 MG) BY MOUTH DAILY 90 tablet 0   atorvastatin (LIPITOR) 20 MG tablet TAKE 1 TABLET(20 MG) BY MOUTH DAILY 90 tablet 0   fluticasone (FLONASE) 50 MCG/ACT nasal spray SHAKE LIQUID AND USE 2 SPRAYS IN EACH NOSTRIL EVERY DAY 16 g 11   meloxicam (MOBIC) 15 MG tablet Take by mouth.     montelukast (SINGULAIR) 10 MG tablet TAKE 1 TABLET(10 MG) BY MOUTH AT BEDTIME 90 tablet 0   orphenadrine (NORFLEX) 100 MG tablet Take 1 tablet (100 mg total) by mouth 2 (two) times daily. 20 tablet 0   telmisartan (MICARDIS) 80 MG tablet Take 1 tablet (80 mg total) by mouth daily. (Patient taking differently:  Take 80 mg by mouth daily. Pt taking 1/2 tablet (40 mg daily).) 90 tablet 3   XYOSTED 100 MG/0.5ML SOAJ INJECT 1 PEN SUBCUTANEOUSLY ONCE EVERY WEEK 2 mL 2   traZODone (DESYREL) 50 MG tablet Take 50 mg by mouth at bedtime.     No current facility-administered medications on file prior to visit.        ROS:  All others reviewed and negative.  Objective        PE:  BP 122/78 (BP Location: Right Arm, Patient Position: Sitting, Cuff Size: Large)   Pulse 84   Temp 98.4 F (36.9 C) (Oral)   Ht 5\' 8"  (1.727 m)   Wt 221 lb (100.2 kg)   SpO2 95%   BMI 33.60 kg/m                 Constitutional: Pt appears in NAD               HENT: Head: NCAT.                Right Ear: External ear normal.                 Left Ear:  External ear normal.                Eyes: . Pupils are equal, round, and reactive to light. Conjunctivae and EOM are normal               Nose: without d/c or deformity               Neck: Neck supple. Gross normal ROM               Cardiovascular: Normal rate and regular rhythm.                 Pulmonary/Chest: Effort normal and breath sounds without rales or wheezing.                Abd:  Soft, NT, ND, + BS, no organomegaly               Neurological: Pt is alert. At baseline orientation, motor grossly intact               Skin: Skin is warm. No rashes, no other new lesions, LE edema - none               Psychiatric: Pt behavior is normal without agitation   Micro: none  Cardiac tracings I have personally interpreted today:  none  Pertinent Radiological findings (summarize): none   Lab Results  Component Value Date   WBC 8.7 06/16/2021   HGB 16.9 06/16/2021   HCT 55.3 (H) 10/05/2021   PLT 253 06/16/2021   GLUCOSE 91 06/16/2021   CHOL 132 11/04/2020   TRIG 69.0 11/04/2020   HDL 34.90 (L) 11/04/2020   LDLDIRECT 137.0 10/31/2019   LDLCALC 83 11/04/2020   ALT 26 06/16/2021   AST 19 06/16/2021   NA 138 06/16/2021   K 3.4 (L) 06/16/2021   CL 100 06/16/2021   CREATININE 0.91 06/16/2021   BUN 19 06/16/2021   CO2 28 06/16/2021   TSH 1.95 11/04/2020   PSA 0.78 10/24/2017   HGBA1C 5.5 11/04/2020   Assessment/Plan:  TARICK PARENTEAU is a 51 y.o. White or Caucasian [1] male with  has a past medical history of ANXIETY (01/13/2007), Arthritis, Chronic LBP (04/19/2011), Headache, HYPERLIPIDEMIA (01/13/2007), Hypertension, NEPHROLITHIASIS, HX OF (04/26/2007), Rhinitis, allergic, and  Testosterone deficiency.  Vitamin D deficiency Last vitamin D Lab Results  Component Value Date   VD25OH 39.21 11/04/2020   Low, to start oral replacement   Encounter for general adult medical examination without abnormal findings Age and sex appropriate education and counseling updated with regular  exercise and diet Referrals for preventative services - declines colonoscopy Immunizations addressed - decliens covid booster,  Smoking counseling  - none needed Evidence for depression or other mood disorder - anxiety overall stable with benzo prn Most recent labs reviewed. I have personally reviewed and have noted: 1) the patient's medical and social history 2) The patient's current medications and supplements 3) The patient's height, weight, and BMI have been recorded in the chart   Hyperlipidemia Lab Results  Component Value Date   LDLCALC 83 11/04/2020   Stable, pt to continue current statin lipitor 20   Hyperglycemia Lab Results  Component Value Date   HGBA1C 5.5 11/04/2020   Stable, pt to continue current medical treatment  - diet   Anxiety Stable, for benzo refill prn use,  to f/u any worsening symptoms or concerns  Obesity Chronic persistent, high risk for developing worsening dm and other comorbids - for wegovy asd,  to f/u any worsening symptoms or concerns  Followup: Return in about 1 year (around 11/10/2022).  [name], MD 11/13/2021 2:27 PM [facility]

## 2021-11-09 NOTE — Assessment & Plan Note (Signed)
Last vitamin D Lab Results  Component Value Date   VD25OH 39.21 11/04/2020   Low, to start oral replacement

## 2021-11-13 ENCOUNTER — Encounter: Payer: Self-pay | Admitting: Internal Medicine

## 2021-11-13 NOTE — Assessment & Plan Note (Signed)
Chronic persistent, high risk for developing worsening dm and other comorbids - for wegovy asd,  to f/u any worsening symptoms or concerns

## 2021-11-13 NOTE — Assessment & Plan Note (Signed)
Age and sex appropriate education and counseling updated with regular exercise and diet Referrals for preventative services - declines colonoscopy Immunizations addressed - decliens covid booster,  Smoking counseling  - none needed Evidence for depression or other mood disorder - anxiety overall stable with benzo prn Most recent labs reviewed. I have personally reviewed and have noted: 1) the patient's medical and social history 2) The patient's current medications and supplements 3) The patient's height, weight, and BMI have been recorded in the chart

## 2021-11-13 NOTE — Assessment & Plan Note (Signed)
Stable, for benzo refill prn use,  to f/u any worsening symptoms or concerns

## 2021-11-13 NOTE — Assessment & Plan Note (Signed)
Lab Results  Component Value Date   LDLCALC 83 11/04/2020   Stable, pt to continue current statin lipitor 20

## 2021-11-13 NOTE — Assessment & Plan Note (Signed)
Lab Results  Component Value Date   HGBA1C 5.5 11/04/2020   Stable, pt to continue current medical treatment  - diet  

## 2021-12-22 ENCOUNTER — Other Ambulatory Visit: Payer: Self-pay | Admitting: *Deleted

## 2021-12-22 ENCOUNTER — Encounter: Payer: Self-pay | Admitting: Internal Medicine

## 2021-12-22 DIAGNOSIS — E291 Testicular hypofunction: Secondary | ICD-10-CM

## 2021-12-22 MED ORDER — XYOSTED 100 MG/0.5ML ~~LOC~~ SOAJ: SUBCUTANEOUS | 3 refills | Status: DC

## 2021-12-23 ENCOUNTER — Other Ambulatory Visit: Payer: Self-pay | Admitting: Internal Medicine

## 2021-12-23 MED ORDER — AMLODIPINE BESYLATE 10 MG PO TABS: ORAL_TABLET | ORAL | 3 refills | Status: DC

## 2021-12-23 MED ORDER — ATORVASTATIN CALCIUM 20 MG PO TABS: ORAL_TABLET | ORAL | 3 refills | Status: DC

## 2021-12-23 NOTE — Telephone Encounter (Signed)
Please advise regarding Wegovy alternative

## 2022-01-07 DIAGNOSIS — M25551 Pain in right hip: Secondary | ICD-10-CM | POA: Diagnosis not present

## 2022-01-07 DIAGNOSIS — S63651D Sprain of metacarpophalangeal joint of left index finger, subsequent encounter: Secondary | ICD-10-CM | POA: Diagnosis not present

## 2022-01-07 DIAGNOSIS — M87051 Idiopathic aseptic necrosis of right femur: Secondary | ICD-10-CM | POA: Diagnosis not present

## 2022-01-07 DIAGNOSIS — M87052 Idiopathic aseptic necrosis of left femur: Secondary | ICD-10-CM | POA: Diagnosis not present

## 2022-01-07 DIAGNOSIS — M25552 Pain in left hip: Secondary | ICD-10-CM | POA: Diagnosis not present

## 2022-01-07 DIAGNOSIS — M79645 Pain in left finger(s): Secondary | ICD-10-CM | POA: Diagnosis not present

## 2022-01-26 DIAGNOSIS — M9904 Segmental and somatic dysfunction of sacral region: Secondary | ICD-10-CM | POA: Diagnosis not present

## 2022-01-26 DIAGNOSIS — M9903 Segmental and somatic dysfunction of lumbar region: Secondary | ICD-10-CM | POA: Diagnosis not present

## 2022-01-26 DIAGNOSIS — M9902 Segmental and somatic dysfunction of thoracic region: Secondary | ICD-10-CM | POA: Diagnosis not present

## 2022-01-26 DIAGNOSIS — S336XXA Sprain of sacroiliac joint, initial encounter: Secondary | ICD-10-CM | POA: Diagnosis not present

## 2022-01-27 DIAGNOSIS — M9903 Segmental and somatic dysfunction of lumbar region: Secondary | ICD-10-CM | POA: Diagnosis not present

## 2022-01-27 DIAGNOSIS — M9902 Segmental and somatic dysfunction of thoracic region: Secondary | ICD-10-CM | POA: Diagnosis not present

## 2022-01-27 DIAGNOSIS — M9904 Segmental and somatic dysfunction of sacral region: Secondary | ICD-10-CM | POA: Diagnosis not present

## 2022-01-27 DIAGNOSIS — S336XXA Sprain of sacroiliac joint, initial encounter: Secondary | ICD-10-CM | POA: Diagnosis not present

## 2022-02-07 ENCOUNTER — Other Ambulatory Visit: Payer: Self-pay

## 2022-02-07 MED ORDER — TELMISARTAN 80 MG PO TABS: 80.0000 mg | ORAL_TABLET | Freq: Every day | ORAL | 3 refills | Status: DC

## 2022-02-08 DIAGNOSIS — M9903 Segmental and somatic dysfunction of lumbar region: Secondary | ICD-10-CM | POA: Diagnosis not present

## 2022-02-08 DIAGNOSIS — M9905 Segmental and somatic dysfunction of pelvic region: Secondary | ICD-10-CM | POA: Diagnosis not present

## 2022-02-08 DIAGNOSIS — M9904 Segmental and somatic dysfunction of sacral region: Secondary | ICD-10-CM | POA: Diagnosis not present

## 2022-02-08 DIAGNOSIS — M9901 Segmental and somatic dysfunction of cervical region: Secondary | ICD-10-CM | POA: Diagnosis not present

## 2022-02-08 DIAGNOSIS — S336XXA Sprain of sacroiliac joint, initial encounter: Secondary | ICD-10-CM | POA: Diagnosis not present

## 2022-02-08 DIAGNOSIS — M9902 Segmental and somatic dysfunction of thoracic region: Secondary | ICD-10-CM | POA: Diagnosis not present

## 2022-02-15 DIAGNOSIS — S336XXA Sprain of sacroiliac joint, initial encounter: Secondary | ICD-10-CM | POA: Diagnosis not present

## 2022-02-15 DIAGNOSIS — M9904 Segmental and somatic dysfunction of sacral region: Secondary | ICD-10-CM | POA: Diagnosis not present

## 2022-02-15 DIAGNOSIS — M9901 Segmental and somatic dysfunction of cervical region: Secondary | ICD-10-CM | POA: Diagnosis not present

## 2022-02-15 DIAGNOSIS — M9903 Segmental and somatic dysfunction of lumbar region: Secondary | ICD-10-CM | POA: Diagnosis not present

## 2022-02-15 DIAGNOSIS — M9902 Segmental and somatic dysfunction of thoracic region: Secondary | ICD-10-CM | POA: Diagnosis not present

## 2022-02-15 DIAGNOSIS — M9905 Segmental and somatic dysfunction of pelvic region: Secondary | ICD-10-CM | POA: Diagnosis not present

## 2022-02-16 ENCOUNTER — Inpatient Hospital Stay: Payer: 59 | Attending: Internal Medicine

## 2022-02-16 ENCOUNTER — Inpatient Hospital Stay: Payer: 59

## 2022-02-16 ENCOUNTER — Other Ambulatory Visit: Payer: Self-pay | Admitting: *Deleted

## 2022-02-16 ENCOUNTER — Encounter: Payer: Self-pay | Admitting: Internal Medicine

## 2022-02-16 ENCOUNTER — Inpatient Hospital Stay (HOSPITAL_BASED_OUTPATIENT_CLINIC_OR_DEPARTMENT_OTHER): Payer: 59 | Admitting: Internal Medicine

## 2022-02-16 DIAGNOSIS — F17210 Nicotine dependence, cigarettes, uncomplicated: Secondary | ICD-10-CM | POA: Diagnosis not present

## 2022-02-16 DIAGNOSIS — D751 Secondary polycythemia: Secondary | ICD-10-CM | POA: Diagnosis not present

## 2022-02-16 DIAGNOSIS — G4733 Obstructive sleep apnea (adult) (pediatric): Secondary | ICD-10-CM | POA: Insufficient documentation

## 2022-02-16 DIAGNOSIS — E291 Testicular hypofunction: Secondary | ICD-10-CM | POA: Insufficient documentation

## 2022-02-16 DIAGNOSIS — Z79899 Other long term (current) drug therapy: Secondary | ICD-10-CM | POA: Insufficient documentation

## 2022-02-16 DIAGNOSIS — R69 Illness, unspecified: Secondary | ICD-10-CM | POA: Diagnosis not present

## 2022-02-16 LAB — HEMOGLOBIN AND HEMATOCRIT, BLOOD
HCT: 49.2 % (ref 39.0–52.0)
Hemoglobin: 17.7 g/dL — ABNORMAL HIGH (ref 13.0–17.0)

## 2022-02-16 NOTE — Progress Notes (Signed)
[facility] CONSULT NOTE  Patient Care Team: Corwin Levins, MD as PCP - General  CHIEF COMPLAINTS/PURPOSE OF CONSULTATION: ERYTHROCYTOSIS   HEMATOLOGY HISTORY  # ERYTHROCYTOSIS sec to testosterone [since 2017] [Dr.Stoioff] [Hb; WBC; platelets]- JAK-2 Negative  # Hypogonadism [100 injection- 5-6 years]; OSA/CPAP; social smoker  HISTORY OF PRESENTING ILLNESS: Alone.  Ambulating independently.  Gabriel Moon 51 y.o.  male pleasant patient with erythrocytosis secondary to testosterone is here for follow-up.  Patient had phlebotomy in April 2023.  Feels improved post phlebotomy.  Patient denies any flushing.  Denies any worsening fatigue.  Denies any history of thromboembolic events.   Review of Systems  Constitutional:  Negative for chills, diaphoresis, fever, malaise/fatigue and weight loss.  HENT:  Negative for nosebleeds and sore throat.   Eyes:  Negative for double vision.  Respiratory:  Negative for cough, hemoptysis, sputum production, shortness of breath and wheezing.   Cardiovascular:  Negative for chest pain, palpitations, orthopnea and leg swelling.  Gastrointestinal:  Negative for abdominal pain, blood in stool, constipation, diarrhea, heartburn, melena, nausea and vomiting.  Genitourinary:  Negative for dysuria, frequency and urgency.  Musculoskeletal:  Positive for back pain. Negative for joint pain.  Skin: Negative.  Negative for itching and rash.  Neurological:  Negative for dizziness, tingling, focal weakness, weakness and headaches.  Endo/Heme/Allergies:  Does not bruise/bleed easily.  Psychiatric/Behavioral:  Negative for depression. The patient is not nervous/anxious and does not have insomnia.      MEDICAL HISTORY:  Past Medical History:  Diagnosis Date   ANXIETY 01/13/2007   Arthritis    OSTEO, MIDFOOT   Chronic LBP 04/19/2011   Headache    HYPERLIPIDEMIA 01/13/2007   Hypertension    LOST WEIGHT, NOT ON MEDS    NEPHROLITHIASIS, HX OF  04/26/2007   Rhinitis, allergic    Testosterone deficiency    MALE HYPOGONADISM    SURGICAL HISTORY: Past Surgical History:  Procedure Laterality Date   BONE EXOSTOSIS EXCISION Right 11/27/2014   Procedure: PARTIAL EXCISION OF TALUS;  Surgeon: Recardo Evangelist, DPM;  Location: [facility];  Service: Podiatry;  Laterality: Right;    SOCIAL HISTORY: Social History   Socioeconomic History   Marital status: Divorced    Spouse name: Not on file   Number of children: Not on file   Years of education: Not on file   Highest education level: Not on file  Occupational History   Occupation: Radiographer, therapeutic service  Tobacco Use   Smoking status: Former    Packs/day: 1.00    Years: 15.00    Total pack years: 15.00    Types: Cigarettes    Quit date: 06/20/2002    Years since quitting: 19.6   Smokeless tobacco: Never  Vaping Use   Vaping Use: Former  Substance and Sexual Activity   Alcohol use: Yes    Alcohol/week: 2.0 standard drinks of alcohol    Types: 2 Cans of beer per week    Comment: 6 OR 7 PER MONTH   Drug use: No   Sexual activity: Yes    Birth control/protection: None  Other Topics Concern   Not on file  Social History Narrative   [employer] [place]; light smoker [1 for 10 days]; alcohol- once couple of weeks; [place] in [place].     Social Determinants of Health   Financial Resource Strain: Not on file  Food Insecurity: Not on file  Transportation Needs: Not on file  Physical Activity: Not on  file  Stress: Not on file  Social Connections: Not on file  Intimate Partner Violence: Not on file    FAMILY HISTORY: Family History  Problem Relation Age of Onset   Diabetes Mother    Prostate cancer Father    Migraines Father     ALLERGIES:  is allergic to effexor [venlafaxine] and lexapro [escitalopram oxalate].  MEDICATIONS:  Current Outpatient Medications  Medication Sig Dispense Refill   ALPRAZolam (XANAX) 0.5 MG tablet Take  1 tablet (0.5 mg total) by mouth 3 (three) times daily as needed. 90 tablet 2   amLODipine (NORVASC) 10 MG tablet TAKE 1 TABLET(10 MG) BY MOUTH DAILY 90 tablet 3   atorvastatin (LIPITOR) 20 MG tablet TAKE 1 TABLET(20 MG) BY MOUTH DAILY 90 tablet 3   fluticasone (FLONASE) 50 MCG/ACT nasal spray SHAKE LIQUID AND USE 2 SPRAYS IN EACH NOSTRIL EVERY DAY 16 g 11   hydrochlorothiazide (MICROZIDE) 12.5 MG capsule TAKE 1 CAPSULE(12.5 MG) BY MOUTH DAILY Must keep scheduled appt for future refills 90 capsule 3   telmisartan (MICARDIS) 80 MG tablet Take 1 tablet (80 mg total) by mouth daily. 90 tablet 3   Testosterone Enanthate (XYOSTED) 100 MG/0.5ML SOAJ INJECT 1 PEN SUBCUTANEOUSLY ONCE EVERY WEEK 2 mL 3   No current facility-administered medications for this visit.     Marland Kitchen  PHYSICAL EXAMINATION:   Vitals:   02/16/22 1342  BP: (!) 149/94  Pulse: 79  Temp: (!) 97.5 F (36.4 C)  SpO2: 100%   Filed Weights   02/16/22 1342  Weight: 227 lb 3.2 oz (103.1 kg)    Physical Exam HENT:     Head: Normocephalic and atraumatic.     Mouth/Throat:     Pharynx: No oropharyngeal exudate.  Eyes:     Pupils: Pupils are equal, round, and reactive to light.  Cardiovascular:     Rate and Rhythm: Normal rate and regular rhythm.  Pulmonary:     Effort: No respiratory distress.     Breath sounds: No wheezing.  Abdominal:     General: Bowel sounds are normal. There is no distension.     Palpations: Abdomen is soft. There is no mass.     Tenderness: There is no abdominal tenderness. There is no guarding or rebound.  Musculoskeletal:        General: No tenderness. Normal range of motion.     Cervical back: Normal range of motion and neck supple.  Skin:    General: Skin is warm.  Neurological:     Mental Status: He is alert and oriented to person, place, and time.  Psychiatric:        Mood and Affect: Affect normal.      LABORATORY DATA:  I have reviewed the data as listed Lab Results  Component  Value Date   WBC 8.7 06/16/2021   HGB 17.7 (H) 02/16/2022   HCT 49.2 02/16/2022   MCV 89.3 06/16/2021   PLT 253 06/16/2021   Recent Labs    06/16/21 1331  NA 138  K 3.4*  CL 100  CO2 28  GLUCOSE 91  BUN 19  CREATININE 0.91  CALCIUM 9.5  GFRNONAA >60  PROT 7.7  ALBUMIN 4.6  AST 19  ALT 26  ALKPHOS 53  BILITOT 0.5     No results found.  ASSESSMENT & PLAN:   Erythrocytosis #Erythrocytosis-sec to testosterone. clinically asymptomatic; JAK-2 negative.    #Today hematocrit is 49 [phlebotomy greater than 48-52 hematocrit]- symptomatically improved.  Proceed with phlebotomy  given his ongoing testosterone use.   # DISPOSITION:  #  today phlebotomy  # follow up in 6  months-MD; labs- H&H; possible phlebotomy-dr.B     Earna Coder, MD 02/16/2022 2:07 PM

## 2022-02-16 NOTE — Assessment & Plan Note (Addendum)
#  Erythrocytosis-sec to testosterone. clinically asymptomatic; JAK-2 negative.    #Today hematocrit is 49 [phlebotomy greater than 48-52 hematocrit]- symptomatically improved.  Proceed with phlebotomy given his ongoing testosterone use.  # DISPOSITION:  #  today phlebotomy  # follow up in 6  months-MD; labs- H&H; possible phlebotomy-dr.B

## 2022-02-16 NOTE — Progress Notes (Signed)
No concerns. 

## 2022-02-22 DIAGNOSIS — M9901 Segmental and somatic dysfunction of cervical region: Secondary | ICD-10-CM | POA: Diagnosis not present

## 2022-02-22 DIAGNOSIS — M9904 Segmental and somatic dysfunction of sacral region: Secondary | ICD-10-CM | POA: Diagnosis not present

## 2022-02-22 DIAGNOSIS — S336XXA Sprain of sacroiliac joint, initial encounter: Secondary | ICD-10-CM | POA: Diagnosis not present

## 2022-02-22 DIAGNOSIS — M9905 Segmental and somatic dysfunction of pelvic region: Secondary | ICD-10-CM | POA: Diagnosis not present

## 2022-02-22 DIAGNOSIS — M9902 Segmental and somatic dysfunction of thoracic region: Secondary | ICD-10-CM | POA: Diagnosis not present

## 2022-02-22 DIAGNOSIS — M9903 Segmental and somatic dysfunction of lumbar region: Secondary | ICD-10-CM | POA: Diagnosis not present

## 2022-03-20 ENCOUNTER — Other Ambulatory Visit: Payer: Self-pay | Admitting: Internal Medicine

## 2022-04-07 ENCOUNTER — Other Ambulatory Visit: Payer: 59

## 2022-04-07 ENCOUNTER — Other Ambulatory Visit: Payer: Self-pay | Admitting: Urology

## 2022-04-07 DIAGNOSIS — E291 Testicular hypofunction: Secondary | ICD-10-CM

## 2022-04-07 DIAGNOSIS — N200 Calculus of kidney: Secondary | ICD-10-CM

## 2022-04-08 LAB — LITHOLINK SERUM PANEL
CO2: 20 mmol/L (ref 20–29)
Calcium: 9.3 mg/dL (ref 8.7–10.2)
Chloride: 104 mmol/L (ref 96–106)
Creatinine, Ser: 0.92 mg/dL (ref 0.76–1.27)
Magnesium: 2 mg/dL (ref 1.6–2.3)
Phosphorus: 2.6 mg/dL — ABNORMAL LOW (ref 2.8–4.1)
Potassium: 3.8 mmol/L (ref 3.5–5.2)
Sodium: 141 mmol/L (ref 134–144)
Uric Acid: 5.3 mg/dL (ref 3.8–8.4)
eGFR: 101 mL/min/{1.73_m2} (ref >59)

## 2022-04-08 LAB — TESTOSTERONE: Testosterone: 467 ng/dL (ref 264–916)

## 2022-04-09 ENCOUNTER — Other Ambulatory Visit: Payer: Self-pay | Admitting: Internal Medicine

## 2022-04-09 LAB — LITHOLINK SERUM PANEL
CO2: 19 mmol/L — ABNORMAL LOW (ref 20–29)
Calcium: 9.3 mg/dL (ref 8.7–10.2)
Chloride: 104 mmol/L (ref 96–106)
Creatinine, Ser: 0.91 mg/dL (ref 0.76–1.27)
Magnesium: 2.1 mg/dL (ref 1.6–2.3)
Phosphorus: 2.9 mg/dL (ref 2.8–4.1)
Potassium: 3.8 mmol/L (ref 3.5–5.2)
Sodium: 140 mmol/L (ref 134–144)
Uric Acid: 5.4 mg/dL (ref 3.8–8.4)
eGFR: 103 mL/min/{1.73_m2} (ref >59)

## 2022-04-13 LAB — LITHOLINK 24HR URINE PANEL
Ammonium, Urine: 40 mmol/24 hr (ref 15–60)
Calcium Oxalate Saturation: 6.55 (ref 6.00–10.00)
Calcium Phosphate Saturation: 2.58 — ABNORMAL HIGH (ref 0.50–2.00)
Calcium, Urine: 149 mg/24 hr (ref <250)
Calcium/Creatinine Ratio: 69 mg/g creat (ref 34–196)
Calcium/Kg Body Weight: 1.5 mg/24 hr/kg (ref <4.0)
Chloride, Urine: 149 mmol/24 hr (ref 70–250)
Citrate, Urine: 347 mg/24 hr — ABNORMAL LOW (ref >450)
Creatinine, Urine: 2157 mg/24 hr
Creatinine/Kg Body Weight: 21.1 mg/24 hr/kg (ref 11.9–24.4)
Cystine, Urine, Qualitative: NEGATIVE
Magnesium, Urine: 96 mg/24 hr (ref 30–120)
Oxalate, Urine: 48 mg/24 hr — ABNORMAL HIGH (ref 20–40)
Phosphorus, Urine: 1285 mg/24 hr — ABNORMAL HIGH (ref 600–1200)
Potassium, Urine: 48 mmol/24 hr (ref 20–100)
Protein Catabolic Rate: 0.9 g/kg/24 hr (ref 0.8–1.4)
Sodium, Urine: 216 mmol/24 hr — ABNORMAL HIGH (ref 50–150)
Sulfate, Urine: 47 meq/24 hr (ref 20–80)
Urea Nitrogen, Urine: 11.36 g/24 hr (ref 6.00–14.00)
Uric Acid Saturation: 0.19 (ref <1.00)
Uric Acid, Urine: 868 mg/24 hr — ABNORMAL HIGH (ref <800)
Urine Volume (Preserved): 1570 mL/24 hr (ref 500–4000)
pH, 24 hr, Urine: 6.83 — ABNORMAL HIGH (ref 5.800–6.200)

## 2022-04-18 ENCOUNTER — Encounter: Payer: Self-pay | Admitting: *Deleted

## 2022-04-18 ENCOUNTER — Telehealth: Payer: Self-pay | Admitting: *Deleted

## 2022-04-18 NOTE — Telephone Encounter (Signed)
-----   Message from Abbie Sons, MD sent at 04/17/2022 12:12 PM EDT ----- Had several abnormalities of 1.4 urine study.  Recommend follow-up appointment to discuss management options

## 2022-04-18 NOTE — Telephone Encounter (Signed)
Sent my chart message and advised patient to call us to make an appt.

## 2022-04-22 ENCOUNTER — Ambulatory Visit (INDEPENDENT_AMBULATORY_CARE_PROVIDER_SITE_OTHER): Payer: 59 | Admitting: Urology

## 2022-04-22 VITALS — BP 142/81 | HR 75 | Ht 68.0 in | Wt 230.0 lb

## 2022-04-22 DIAGNOSIS — N200 Calculus of kidney: Secondary | ICD-10-CM

## 2022-04-22 NOTE — Progress Notes (Unsigned)
04/22/2022 9:41 AM   Gabriel Moon June 11, 1971 [id number]  Referring provider: Biagio Borg, MD [address] [place],  [place] [address]  Chief Complaint  Patient presents with   Follow-up    HPI: 51 y.o. male followed for hypogonadism and recurrent nephrolithiasis presents to discuss his metabolic stone evaluation.  Blood work showed no significant abnormalities He had a low urine volume at 1570 mL.  Citrate was low at 347 mL though urine pH was elevated at 6.8.  His calcium phosphate supersaturation was 2.58. He has had stones previously sent off and states they were calcium base but not sure if calcium oxalate or calcium phosphate.  He will see if he can find previous reports Urine uric acid slightly elevated 868 He had mild hyperoxaluria   PMH: Past Medical History:  Diagnosis Date   ANXIETY 01/13/2007   Arthritis    OSTEO, MIDFOOT   Chronic LBP 04/19/2011   Headache    HYPERLIPIDEMIA 01/13/2007   Hypertension    LOST WEIGHT, NOT ON MEDS    NEPHROLITHIASIS, HX OF 04/26/2007   Rhinitis, allergic    Testosterone deficiency    MALE HYPOGONADISM    Surgical History: Past Surgical History:  Procedure Laterality Date   BONE EXOSTOSIS EXCISION Right 11/27/2014   Procedure: PARTIAL EXCISION OF TALUS;  Surgeon: Albertine Patricia, DPM;  Location: [facility];  Service: Podiatry;  Laterality: Right;    Home Medications:  Allergies as of 04/22/2022       Reactions   Effexor [venlafaxine] Other (See Comments)   Blurry vision, fatgue   Lexapro [escitalopram Oxalate]    BAD REACTION-SUICIDAL        Medication List        Accurate as of April 22, 2022  9:41 AM. If you have any questions, ask your nurse or doctor.          ALPRAZolam 0.5 MG tablet Commonly known as: XANAX TAKE 1 TABLET(0.5 MG) BY MOUTH THREE TIMES DAILY AS NEEDED   amLODipine 10 MG tablet Commonly known as: NORVASC TAKE 1 TABLET(10 MG) BY MOUTH DAILY   atorvastatin 20 MG  tablet Commonly known as: LIPITOR TAKE 1 TABLET(20 MG) BY MOUTH DAILY   fluticasone 50 MCG/ACT nasal spray Commonly known as: FLONASE SHAKE LIQUID AND USE 2 SPRAYS IN EACH NOSTRIL EVERY DAY   hydrochlorothiazide 12.5 MG capsule Commonly known as: MICROZIDE TAKE 1 CAPSULE(12.5 MG) BY MOUTH DAILY Must keep scheduled appt for future refills   telmisartan 80 MG tablet Commonly known as: Micardis Take 1 tablet (80 mg total) by mouth daily.   Xyosted 100 MG/0.5ML Soaj Generic drug: Testosterone Enanthate INJECT 1 PEN SUBCUTANEOUSLY ONCE EVERY WEEK        Allergies:  Allergies  Allergen Reactions   Effexor [Venlafaxine] Other (See Comments)    Blurry vision, fatgue   Lexapro [Escitalopram Oxalate]     BAD REACTION-SUICIDAL    Family History: Family History  Problem Relation Age of Onset   Diabetes Mother    Prostate cancer Father    Migraines Father     Social History:  reports that he quit smoking about 19 years ago. His smoking use included cigarettes. He has a 15.00 pack-year smoking history. He has never used smokeless tobacco. He reports current alcohol use of about 2.0 standard drinks of alcohol per week. He reports that he does not use drugs.   Physical Exam: BP (!) 142/81   Pulse 75   Ht 5\' 8"  (1.727  m)   Wt 230 lb (104.3 kg)   BMI 34.97 kg/m   Constitutional:  Alert and oriented, No acute distress. HEENT: [place] AT Respiratory: Normal respiratory effort, no increased work of breathing. Psychiatric: Normal mood and affect.   Assessment & Plan:    1.  Recurrent nephrolithiasis Recommend increasing water intake to keep urine output ~ 2.5 L/day We discussed dietary measures to increase citrate levels.  Would avoid potassium citrate secondary to his elevated urine pH.  This could possibly be secondary to RTA*** Dietary oxalate moderation was discussed  Gabriel Altes, MD  [facility] [address], [address] [place], [place]  [address] [phone]

## 2022-04-24 ENCOUNTER — Encounter: Payer: Self-pay | Admitting: Urology

## 2022-05-05 ENCOUNTER — Other Ambulatory Visit: Payer: Self-pay | Admitting: Family Medicine

## 2022-05-05 DIAGNOSIS — E291 Testicular hypofunction: Secondary | ICD-10-CM

## 2022-05-08 MED ORDER — XYOSTED 100 MG/0.5ML ~~LOC~~ SOAJ: SUBCUTANEOUS | 3 refills | Status: DC

## 2022-05-11 DIAGNOSIS — M170 Bilateral primary osteoarthritis of knee: Secondary | ICD-10-CM | POA: Diagnosis not present

## 2022-05-16 ENCOUNTER — Other Ambulatory Visit: Payer: Self-pay | Admitting: Urology

## 2022-05-16 ENCOUNTER — Encounter: Payer: Self-pay | Admitting: Urology

## 2022-05-16 DIAGNOSIS — E291 Testicular hypofunction: Secondary | ICD-10-CM

## 2022-05-16 MED ORDER — XYOSTED 100 MG/0.5ML ~~LOC~~ SOAJ: SUBCUTANEOUS | 3 refills | Status: DC

## 2022-07-11 DIAGNOSIS — M87051 Idiopathic aseptic necrosis of right femur: Secondary | ICD-10-CM | POA: Diagnosis not present

## 2022-07-11 DIAGNOSIS — M160 Bilateral primary osteoarthritis of hip: Secondary | ICD-10-CM | POA: Diagnosis not present

## 2022-07-11 DIAGNOSIS — M87052 Idiopathic aseptic necrosis of left femur: Secondary | ICD-10-CM | POA: Diagnosis not present

## 2022-07-12 IMAGING — MR MR LUMBAR SPINE W/O CM
4 of 6 series · 18 of 48 positions shown · non-contrast
Comparison: None.

CLINICAL DATA: Lower back pain and spasms for 9 months with no
known injury

EXAM:
MRI LUMBAR SPINE WITHOUT CONTRAST
TECHNIQUE: Multiplanar, multisequence MR imaging of the lumbar spine was
performed. No intravenous contrast was administered.

[Series 6: T2 · sagittal · 4.0mm · 0.73mm/px · 5 of 15 slices shown (1 of 2)]
[im 1/15]
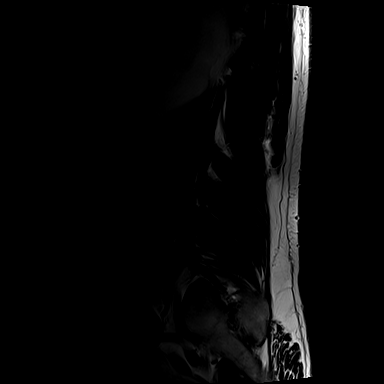
[im 4/15]
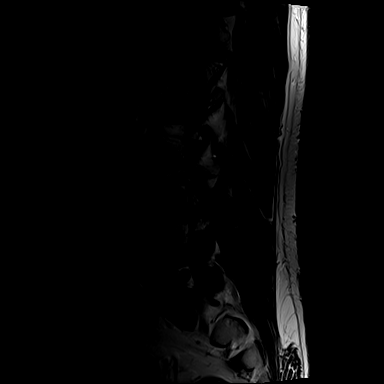
[im 8/15]
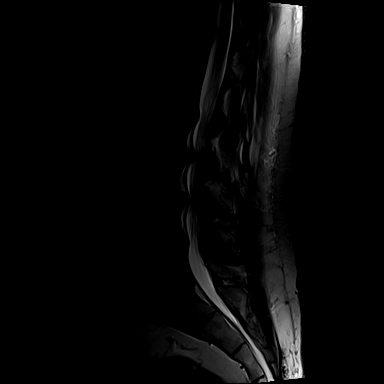
[im 11/15]
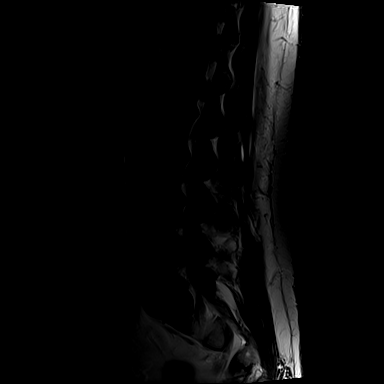
[im 15/15]
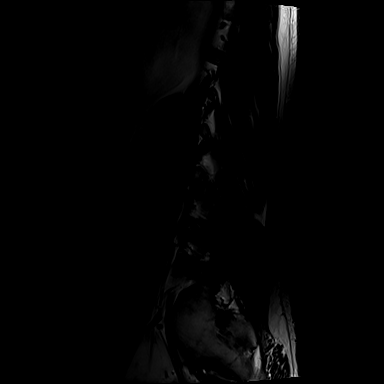

[Series 7: T1 · sagittal · 4.0mm · 0.73mm/px · 3 of 15 slices shown (1 of 2)]
[im 3/15]
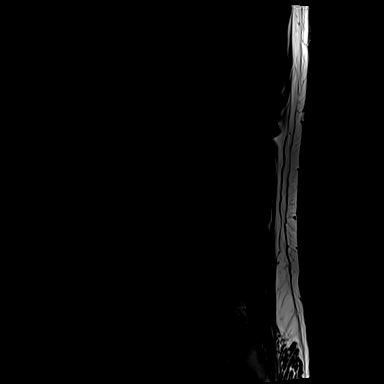
[im 9/15]
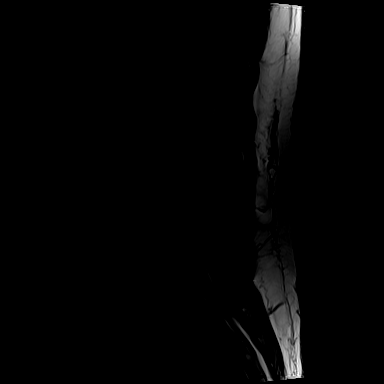
[im 15/15]
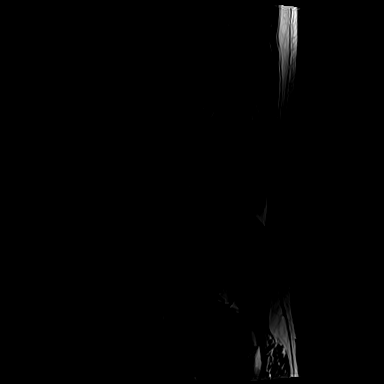

[Series 9: T1 · axial · 4.0mm · 0.28mm/px · z∈[+18,+108]mm · 3 of 25 slices shown (2 of 2)]
[im 4/25]
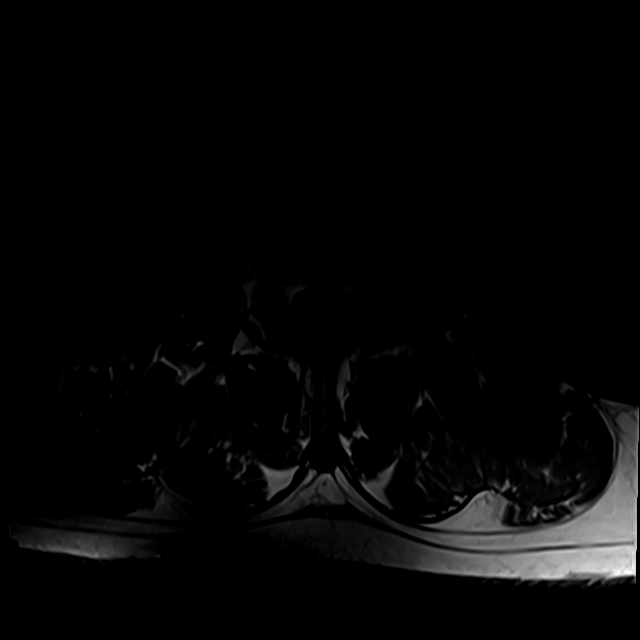
[im 13/25]
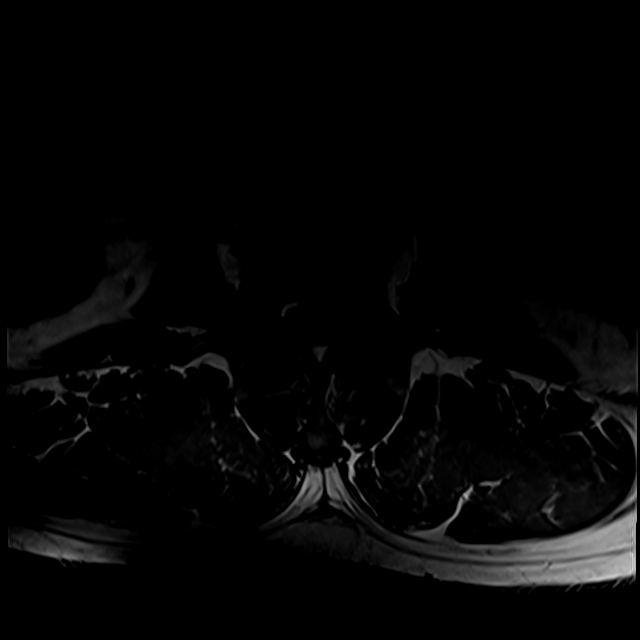
[im 22/25]
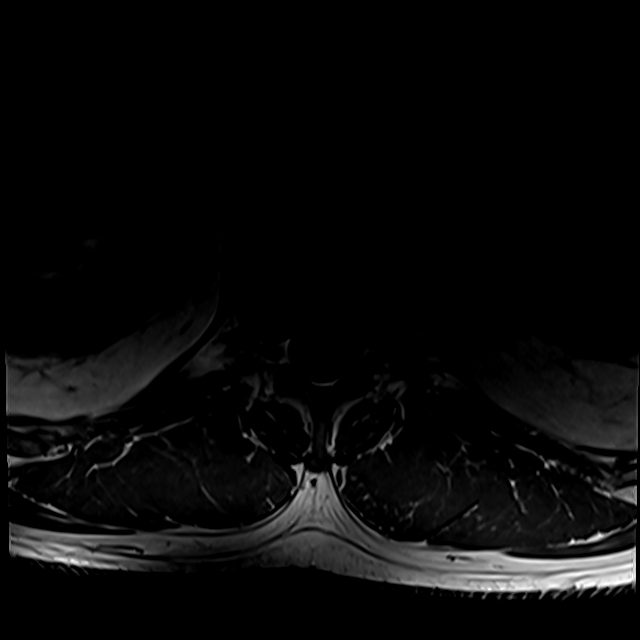

[Series 13: T2 · axial · 4.0mm · 0.28mm/px · z∈[-88,+93]mm · 7 of 42 slices shown (2 of 2)]
[im 3/42]
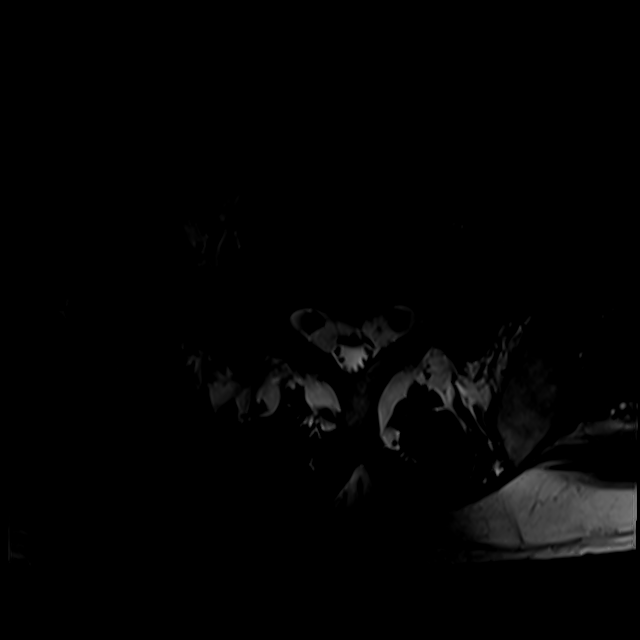
[im 6/42]
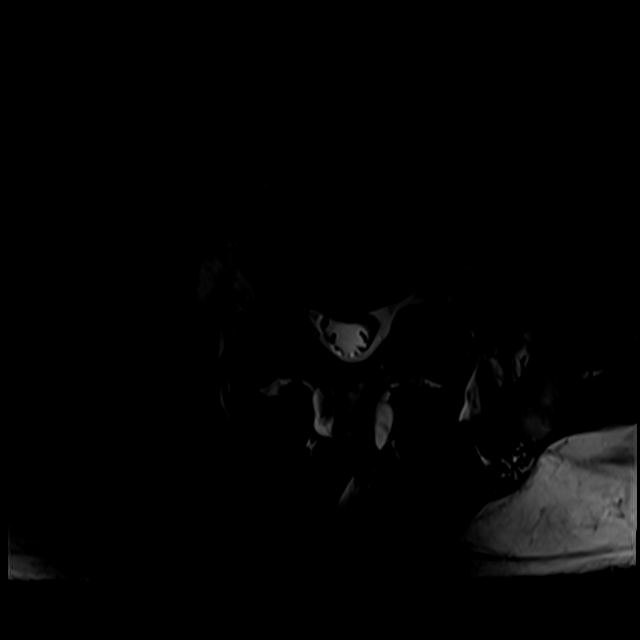
[im 9/42]
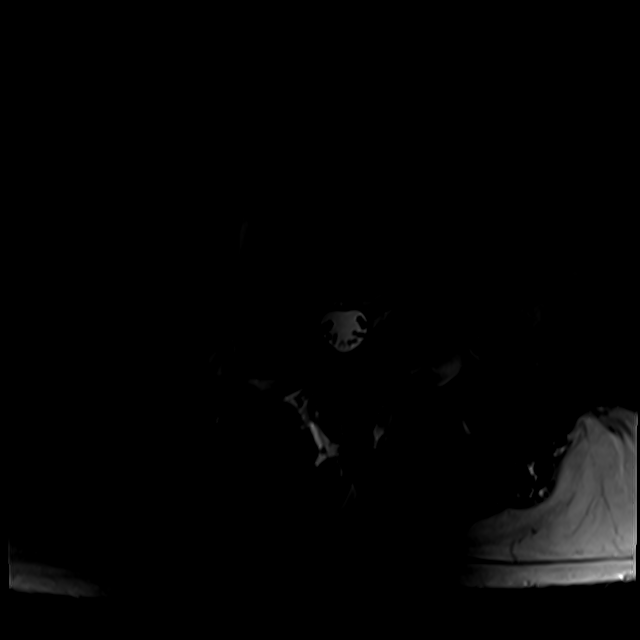
[im 14/42]
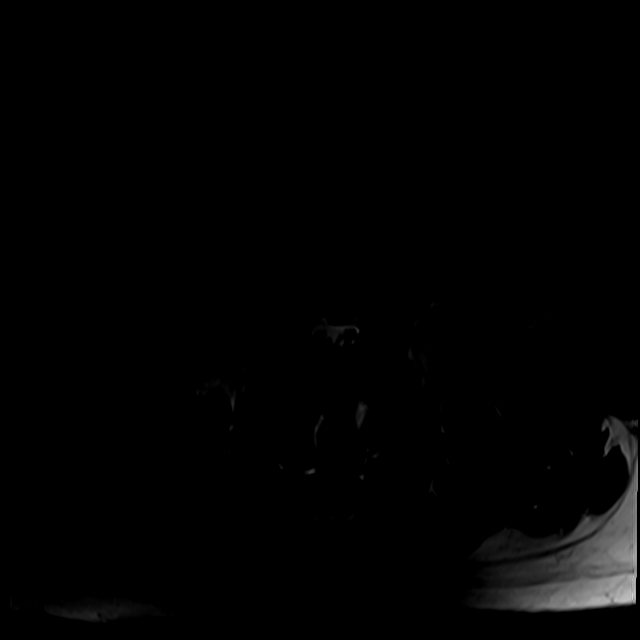
[im 20/42]
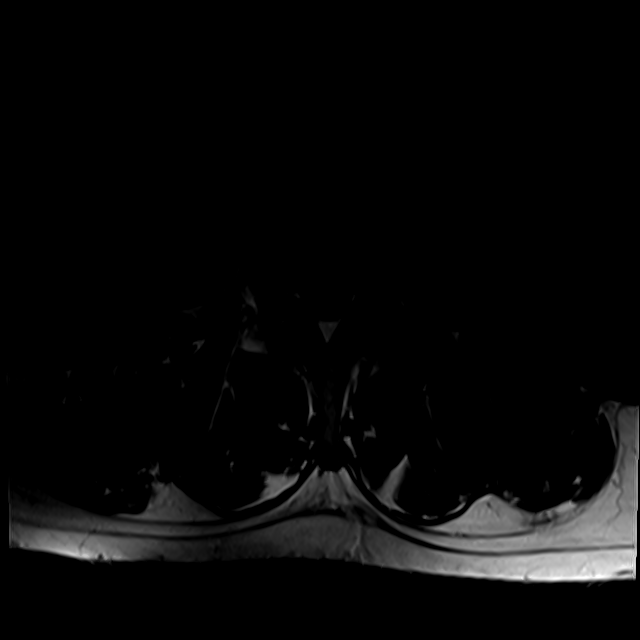
[im 22/42]
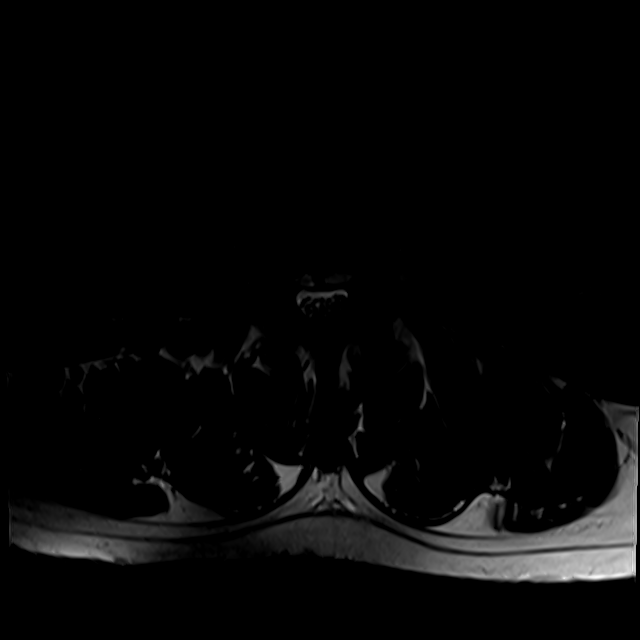
[im 36/42]
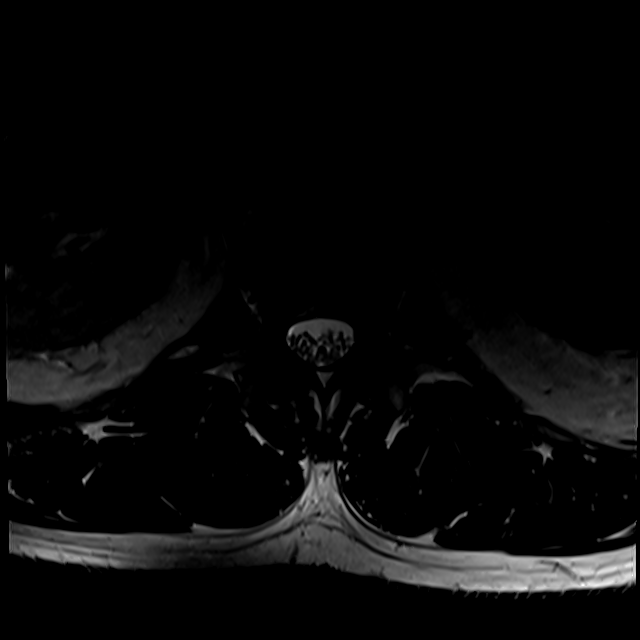

[18 of 48 positions shown; findings below may reference images not displayed]

FINDINGS: Segmentation:  5 lumbar type vertebrae

Alignment:  Physiologic.

Vertebrae:  No fracture, evidence of discitis, or bone lesion.

Conus medullaris and cauda equina: Conus extends to the L1 level.
Conus and cauda equina appear normal.

Paraspinal and other soft tissues: Negative for perispinal mass or
inflammation

Disc levels:

T12- L1: Unremarkable.

L1-L2: Disc narrowing and bulging.  Borderline spinal stenosis

L2-L3: Disc narrowing and bulging with central protrusion that
compresses the thecal sac and effaces CSF.

L3-L4: Disc narrowing and bulging with central and left foraminal
protrusion. Facet spurring and mild ligamentum flavum thickening.
Compressive thecal sac narrowing. Mild-to-moderate left foraminal
narrowing

L4-L5: Disc narrowing and bulging. Borderline facet spurring and
ligamentous thickening. Mild to moderate left foraminal stenosis.

L5-S1:Moderate degenerative facet spurring.  No neural impingement.
IMPRESSION: 1. Lumbar spine degeneration accentuated by congenital spinal
stenosis.
2. Compressive thecal sac narrowing at L2-3 and L3-4 primarily
related to protrusions and short pedicles.
3. Mild to moderate left foraminal narrowing at L3-4 and L4-5.

## 2022-07-20 ENCOUNTER — Other Ambulatory Visit: Payer: Self-pay | Admitting: Physician Assistant

## 2022-07-20 DIAGNOSIS — Z1152 Encounter for screening for COVID-19: Secondary | ICD-10-CM

## 2022-07-20 LAB — POCT INFLUENZA A/B
Influenza A, POC: NEGATIVE
Influenza B, POC: NEGATIVE

## 2022-07-20 LAB — POC COVID19 BINAXNOW: SARS Coronavirus 2 Ag: POSITIVE — AB

## 2022-07-20 MED ORDER — METHYLPREDNISOLONE 4 MG PO TBPK: ORAL_TABLET | ORAL | 0 refills | Status: DC

## 2022-07-20 MED ORDER — PROMETHAZINE-DM 6.25-15 MG/5ML PO SYRP: 5.0000 mL | ORAL_SOLUTION | Freq: Four times a day (QID) | ORAL | 0 refills | Status: DC | PRN

## 2022-07-20 NOTE — Progress Notes (Signed)
   Subjective: Cough and congestion    Patient ID: Gabriel Moon, male    DOB: July 26, 1970, 52 y.o.   MRN: [id number]  HPI Patient complains of 5 days of nasal congestion, productive cough, and ear pressure.  Denies recent travel or known contact with COVID-19.  Patient test positive for COVID-19   Review of Systems Allergic rhinitis, hypertension, hyperlipidemia, and obesity.    Objective:   Physical Exam Deferred secondary       Assessment & Plan: COVID-19   Advised supportive care with a prescription for Promethazine DM cough syrup and Medrol Dosepak.  Follow-up if no improvement or worsening of complaint.

## 2022-07-20 NOTE — Progress Notes (Signed)
S/Sx started 5 days ago (Saturday): Left ear stopped up Nasal drainage - brown Productive cough - brown phlegm Sinus pressure Denies fever  States he did home covid tests Saturday (12/15/22) & Tuesday (09/17/22) and both tests were negative  Taking Robitussin DM, normal allergy meds & using a Netti Pot  AMD

## 2022-07-29 DIAGNOSIS — G8929 Other chronic pain: Secondary | ICD-10-CM | POA: Diagnosis not present

## 2022-07-29 DIAGNOSIS — M1711 Unilateral primary osteoarthritis, right knee: Secondary | ICD-10-CM | POA: Diagnosis not present

## 2022-08-01 ENCOUNTER — Other Ambulatory Visit: Payer: Self-pay | Admitting: Internal Medicine

## 2022-08-01 DIAGNOSIS — M1711 Unilateral primary osteoarthritis, right knee: Secondary | ICD-10-CM | POA: Diagnosis not present

## 2022-08-01 DIAGNOSIS — G8929 Other chronic pain: Secondary | ICD-10-CM | POA: Diagnosis not present

## 2022-08-17 ENCOUNTER — Encounter: Payer: Self-pay | Admitting: Internal Medicine

## 2022-08-19 ENCOUNTER — Inpatient Hospital Stay: Payer: 59 | Admitting: Internal Medicine

## 2022-08-19 ENCOUNTER — Inpatient Hospital Stay: Payer: 59

## 2022-09-19 DIAGNOSIS — H35711 Central serous chorioretinopathy, right eye: Secondary | ICD-10-CM | POA: Diagnosis not present

## 2022-09-19 DIAGNOSIS — H5213 Myopia, bilateral: Secondary | ICD-10-CM | POA: Diagnosis not present

## 2022-09-19 DIAGNOSIS — H35363 Drusen (degenerative) of macula, bilateral: Secondary | ICD-10-CM | POA: Diagnosis not present

## 2022-09-19 DIAGNOSIS — H43811 Vitreous degeneration, right eye: Secondary | ICD-10-CM | POA: Diagnosis not present

## 2022-10-06 ENCOUNTER — Other Ambulatory Visit: Payer: Self-pay | Admitting: *Deleted

## 2022-10-07 ENCOUNTER — Other Ambulatory Visit: Payer: Self-pay | Admitting: Urology

## 2022-10-07 ENCOUNTER — Other Ambulatory Visit: Payer: 59

## 2022-10-07 DIAGNOSIS — E291 Testicular hypofunction: Secondary | ICD-10-CM

## 2022-10-08 LAB — TESTOSTERONE: Testosterone: 504 ng/dL (ref 264–916)

## 2022-10-08 LAB — PSA: Prostate Specific Ag, Serum: 0.9 ng/mL (ref 0.0–4.0)

## 2022-10-10 ENCOUNTER — Ambulatory Visit
Admission: RE | Admit: 2022-10-10 | Discharge: 2022-10-10 | Disposition: A | Discharge status: Home / Self Care | Payer: 59 | Attending: Urology | Admitting: Urology

## 2022-10-10 ENCOUNTER — Ambulatory Visit
Admission: RE | Admit: 2022-10-10 | Discharge: 2022-10-10 | Disposition: A | Discharge status: Home / Self Care | Payer: 59 | Source: Ambulatory Visit | Attending: Urology | Admitting: Urology

## 2022-10-10 ENCOUNTER — Other Ambulatory Visit
Admission: RE | Admit: 2022-10-10 | Discharge: 2022-10-10 | Disposition: A | Discharge status: Home / Self Care | Payer: 59 | Source: Home / Self Care | Attending: Urology | Admitting: Urology

## 2022-10-10 ENCOUNTER — Other Ambulatory Visit: Payer: Self-pay

## 2022-10-10 ENCOUNTER — Ambulatory Visit (INDEPENDENT_AMBULATORY_CARE_PROVIDER_SITE_OTHER): Payer: 59 | Admitting: Urology

## 2022-10-10 ENCOUNTER — Encounter: Payer: Self-pay | Admitting: Urology

## 2022-10-10 VITALS — BP 147/102 | HR 93 | Temp 97.4°F | Ht 68.0 in | Wt 229.8 lb

## 2022-10-10 DIAGNOSIS — N200 Calculus of kidney: Secondary | ICD-10-CM

## 2022-10-10 DIAGNOSIS — R109 Unspecified abdominal pain: Secondary | ICD-10-CM

## 2022-10-10 DIAGNOSIS — K5730 Diverticulosis of large intestine without perforation or abscess without bleeding: Secondary | ICD-10-CM | POA: Diagnosis not present

## 2022-10-10 DIAGNOSIS — K4020 Bilateral inguinal hernia, without obstruction or gangrene, not specified as recurrent: Secondary | ICD-10-CM | POA: Diagnosis not present

## 2022-10-10 LAB — URINALYSIS, COMPLETE (UACMP) WITH MICROSCOPIC
Bacteria, UA: NONE SEEN
Bilirubin Urine: NEGATIVE
Glucose, UA: NEGATIVE mg/dL
Hgb urine dipstick: NEGATIVE
Ketones, ur: NEGATIVE mg/dL
Leukocytes,Ua: NEGATIVE
Nitrite: NEGATIVE
Protein, ur: NEGATIVE mg/dL
Specific Gravity, Urine: 1.02 (ref 1.005–1.030)
Squamous Epithelial / HPF: NONE SEEN /HPF (ref 0–5)
WBC, UA: NONE SEEN WBC/hpf (ref 0–5)
pH: 7 (ref 5.0–8.0)

## 2022-10-10 NOTE — Progress Notes (Signed)
10/10/2022 2:04 PM   Gabriel Moon April 24, 1971 [id number]  Referring provider: Corwin Levins, MD [address] [place],  [place] [address]  Urological history: 1.  Testosterone deficiency/hypogonadism -contributing factors of age, obesity, hyperglycemia,  -Testosterone level (09/2022) - 504 -Hemoglobin/hematocrit (09/2022) 17.7/49.2 -Xyosted 100 mg SQ every 7 days   2. BPH -PSA (09/2022) - 0.9  3.  Nephrolithiasis -24-hour metabolic workup (5621) - low urine volume at 1570 mL - citrate was low at 347 mL though urine pH was elevated at 6.8 - his calcium phosphate supersaturation was 2.58 - urine uric acid slightly elevated 868 - mild hyperoxaluria  Chief Complaint  Patient presents with   Nephrolithiasis    HPI: Gabriel Moon is a 52 y.o. male who presents today for possible stone.  Previous records reviewed.   Yesterday evening, he had a sudden onset of right lower back pain that radiated to the right testicle.  He could not find a position to give him relief.  He could not find anything that would ease the pain.  He is currently experiencing a 7 out of 10 pain.  The pain is very reminiscent of previous stone episodes.  Patient denies any modifying or aggravating factors.  Patient denies any gross hematuria, dysuria or suprapubic.  Patient denies any fevers, chills, nausea or vomiting.    UA yellow clear, specific remedy 1.020, pH 7.0 and 0-5 RBC's.  KUB bilateral nephrolithiasis and several pelvic phleboliths.  STAT CT no hydro and no ureteral stones seen.   PMH: Past Medical History:  Diagnosis Date   ANXIETY 01/13/2007   Arthritis    OSTEO, MIDFOOT   Chronic LBP 04/19/2011   Headache    HYPERLIPIDEMIA 01/13/2007   Hypertension    LOST WEIGHT, NOT ON MEDS    NEPHROLITHIASIS, HX OF 04/26/2007   Rhinitis, allergic    Testosterone deficiency    MALE HYPOGONADISM    Surgical History: Past Surgical History:  Procedure Laterality Date   BONE EXOSTOSIS  EXCISION Right 11/27/2014   Procedure: PARTIAL EXCISION OF TALUS;  Surgeon: Recardo Evangelist, DPM;  Location: [facility];  Service: Podiatry;  Laterality: Right;    Home Medications:  Allergies as of 10/10/2022       Reactions   Effexor [venlafaxine] Other (See Comments)   Blurry vision, fatgue   Lexapro [escitalopram Oxalate]    BAD REACTION-SUICIDAL        Medication List        Accurate as of October 10, 2022  2:04 PM. If you have any questions, ask your nurse or doctor.          STOP taking these medications    methylPREDNISolone 4 MG Tbpk tablet Commonly known as: MEDROL DOSEPAK Stopped by: Michiel Cowboy, PA-C   promethazine-dextromethorphan 6.25-15 MG/5ML syrup Commonly known as: PROMETHAZINE-DM Stopped by: Michiel Cowboy, PA-C       TAKE these medications    ALPRAZolam 0.5 MG tablet Commonly known as: XANAX TAKE 1 TABLET(0.5 MG) BY MOUTH THREE TIMES DAILY AS NEEDED   amLODipine 10 MG tablet Commonly known as: NORVASC TAKE 1 TABLET(10 MG) BY MOUTH DAILY   atorvastatin 20 MG tablet Commonly known as: LIPITOR TAKE 1 TABLET(20 MG) BY MOUTH DAILY   fluticasone 50 MCG/ACT nasal spray Commonly known as: FLONASE SHAKE LIQUID AND USE 2 SPRAYS IN EACH NOSTRIL EVERY DAY   hydrochlorothiazide 12.5 MG capsule Commonly known as: MICROZIDE TAKE 1 CAPSULE(12.5 MG) BY MOUTH DAILY Must keep scheduled appt  for future refills   telmisartan 80 MG tablet Commonly known as: Micardis Take 1 tablet (80 mg total) by mouth daily.   Xyosted 100 MG/0.5ML Soaj Generic drug: Testosterone Enanthate INJECT ONE pen SUBCUTANEOUS ONCE EVERY WEEK        Allergies:  Allergies  Allergen Reactions   Effexor [Venlafaxine] Other (See Comments)    Blurry vision, fatgue   Lexapro [Escitalopram Oxalate]     BAD REACTION-SUICIDAL    Family History: Family History  Problem Relation Age of Onset   Diabetes Mother    Prostate cancer Father    Migraines Father      Social History:  reports that he quit smoking about 20 years ago. His smoking use included cigarettes. He has a 15.00 pack-year smoking history. He has never used smokeless tobacco. He reports current alcohol use of about 2.0 standard drinks of alcohol per week. He reports that he does not use drugs.  ROS: Pertinent ROS in HPI  Physical Exam: BP (!) 147/102 (BP Location: Left Arm, Patient Position: Sitting, Cuff Size: Large)   Pulse 93   Temp (!) 97.4 F (36.3 C) (Oral)   Ht 5\' 8"  (1.727 m)   Wt 229 lb 12.8 oz (104.2 kg)   BMI 34.94 kg/m   Constitutional:  Well nourished. Alert and oriented, No acute distress. HEENT: [place] AT, moist mucus membranes.  Trachea midline Cardiovascular: No clubbing, cyanosis, or edema. Respiratory: Normal respiratory effort, no increased work of breathing. Neurologic: Grossly intact, no focal deficits, moving all 4 extremities. Psychiatric: Normal mood and affect.  Laboratory Data: Lab Results  Component Value Date   WBC 8.7 06/16/2021   HGB 17.7 (H) 02/16/2022   HCT 49.2 02/16/2022   MCV 89.3 06/16/2021   PLT 253 06/16/2021    Lab Results  Component Value Date   CREATININE 0.92 04/07/2022    Lab Results  Component Value Date   TESTOSTERONE 504 10/07/2022  Urinalysis  See EPIC and HPI I have reviewed the labs.   Pertinent Imaging: KUB bilateral nephrolithiasis CLINICAL DATA:  Right flank pain for 2 days, stone suspected   EXAM: CT ABDOMEN AND PELVIS WITHOUT CONTRAST   TECHNIQUE: Multidetector CT imaging of the abdomen and pelvis was performed following the standard protocol without IV contrast.   RADIATION DOSE REDUCTION: This exam was performed according to the departmental dose-optimization program which includes automated exposure control, adjustment of the mA and/or kV according to patient size and/or use of iterative reconstruction technique.   COMPARISON:  02/07/2020   FINDINGS: Lower chest: No acute abnormality.    Hepatobiliary: No solid liver abnormality is seen. No gallstones, gallbladder wall thickening, or biliary dilatation.   Pancreas: Unremarkable. No pancreatic ductal dilatation or surrounding inflammatory changes.   Spleen: Normal in size without significant abnormality.   Adrenals/Urinary Tract: Adrenal glands are unremarkable. Multiple small bilateral nonobstructive renal calculi. No ureteral calculi or hydronephrosis. Bladder is unremarkable.   Stomach/Bowel: Stomach is within normal limits. Appendix appears normal. No evidence of bowel wall thickening, distention, or inflammatory changes. Descending and sigmoid diverticulosis.   Vascular/Lymphatic: Aortic atherosclerosis. No enlarged abdominal or pelvic lymph nodes.   Reproductive: No mass or other significant abnormality.   Other: Small, fat containing right inguinal hernia.  No ascites.   Musculoskeletal: No acute or significant osseous findings.   IMPRESSION: 1. Multiple small bilateral nonobstructive renal calculi. No ureteral calculi or hydronephrosis. 2. Descending and sigmoid diverticulosis without evidence of acute diverticulitis. 3. Small, fat containing right inguinal hernia.  Aortic Atherosclerosis (ICD10-I70.0).     Electronically Signed   By: Jearld Lesch M.D.   On: 10/10/2022 13:12 I have independently reviewed the films.  See HPI.    Assessment & Plan:    1.  Nephrolithiasis -bilateral stones seen on CT and KUB -explained that these typically do not cause flank pain -he is going to keep his appointment with Dr. Lonna Cobb on Wednesday - I did explain that Dr. Lonna Cobb does not treat back pain and to contact his PCP for further management of his pain  2. Right flank pain -no urological reason for pain seen on CT  -advised the him to seek further care from his PCP  Return for he is going to keep his appointment with Dr. Lonna Cobb on Wednesday .  These notes generated with voice recognition software.  I apologize for typographical errors.  Cloretta Ned  [facility] [address]  [address] [place], [place] [address] [phone]

## 2022-10-11 LAB — URINE CULTURE: Culture: NO GROWTH

## 2022-10-12 ENCOUNTER — Ambulatory Visit (INDEPENDENT_AMBULATORY_CARE_PROVIDER_SITE_OTHER): Payer: 59 | Admitting: Urology

## 2022-10-12 ENCOUNTER — Encounter: Payer: Self-pay | Admitting: Urology

## 2022-10-12 VITALS — BP 128/80 | HR 77 | Ht 68.0 in | Wt 225.0 lb

## 2022-10-12 DIAGNOSIS — Z125 Encounter for screening for malignant neoplasm of prostate: Secondary | ICD-10-CM

## 2022-10-12 DIAGNOSIS — E291 Testicular hypofunction: Secondary | ICD-10-CM

## 2022-10-12 DIAGNOSIS — N200 Calculus of kidney: Secondary | ICD-10-CM | POA: Diagnosis not present

## 2022-10-12 NOTE — Progress Notes (Signed)
I, DeAsia L Maxie,acting as a scribe for Riki Altes, MD.,have documented all relevant documentation on the behalf of Riki Altes, MD,as directed by  Riki Altes, MD while in the presence of Riki Altes, MD.   Albin Fischer Abdulla,acting as a scribe for Riki Altes, MD.,have documented all relevant documentation on the behalf of Riki Altes, MD,as directed by  Riki Altes, MD while in the presence of Riki Altes, MD.  10/12/2022 10:30 AM   Gabriel Moon 1971/01/06 [id number]  Referring provider: Corwin Levins, MD [address] [place],  [place] [address]  Chief Complaint  Patient presents with   Hypogonadism   Urologic history:  1.  Hypogonadism Symptoms tiredness, fatigue Xyosted 100 mg weekly Erythrocytosis followed by hematology  2.  Recurrent nephrolithiasis   HPI: 52 y.o. male presents for annual follow-up.  Since his last annual visit he did undergo a metabolic stone evaluation which was remarkable for a low urine volume of 1.5 L, hypocitraturia, and mild hyperoxaluria. Urine pH was elevated and it was recommended he increase his citrate levels via dietary means and increase his water intake to keep urine output greater than 2.5 L per day. He was scheduled for annual follow up today, however, saw Michiel Cowboy 2 days ago, complaining of right flank pain. A CT was performed which showed small bilateral non-obstructing renal calculi. There is no evidence of ureteral calculi or hydronephrosis. Labs 10/07/2022: testosterone 504 ng/dL, PSA 0.9. Hematocrit was not drawn. Back pain is improved from 2 days ago.   PMH: Past Medical History:  Diagnosis Date   ANXIETY 01/13/2007   Arthritis    OSTEO, MIDFOOT   Chronic LBP 04/19/2011   Headache    HYPERLIPIDEMIA 01/13/2007   Hypertension    LOST WEIGHT, NOT ON MEDS    NEPHROLITHIASIS, HX OF 04/26/2007   Rhinitis, allergic    Testosterone deficiency    MALE HYPOGONADISM    Surgical  History: Past Surgical History:  Procedure Laterality Date   BONE EXOSTOSIS EXCISION Right 11/27/2014   Procedure: PARTIAL EXCISION OF TALUS;  Surgeon: Recardo Evangelist, DPM;  Location: [facility];  Service: Podiatry;  Laterality: Right;    Home Medications:  Allergies as of 10/12/2022       Reactions   Effexor [venlafaxine] Other (See Comments)   Blurry vision, fatgue   Lexapro [escitalopram Oxalate]    BAD REACTION-SUICIDAL        Medication List        Accurate as of October 12, 2022 10:30 AM. If you have any questions, ask your nurse or doctor.          ALPRAZolam 0.5 MG tablet Commonly known as: XANAX TAKE 1 TABLET(0.5 MG) BY MOUTH THREE TIMES DAILY AS NEEDED   amLODipine 10 MG tablet Commonly known as: NORVASC TAKE 1 TABLET(10 MG) BY MOUTH DAILY   atorvastatin 20 MG tablet Commonly known as: LIPITOR TAKE 1 TABLET(20 MG) BY MOUTH DAILY   fluticasone 50 MCG/ACT nasal spray Commonly known as: FLONASE SHAKE LIQUID AND USE 2 SPRAYS IN EACH NOSTRIL EVERY DAY   hydrochlorothiazide 12.5 MG capsule Commonly known as: MICROZIDE TAKE 1 CAPSULE(12.5 MG) BY MOUTH DAILY Must keep scheduled appt for future refills   telmisartan 80 MG tablet Commonly known as: Micardis Take 1 tablet (80 mg total) by mouth daily.   Xyosted 100 MG/0.5ML Soaj Generic drug: Testosterone Enanthate INJECT ONE pen SUBCUTANEOUS ONCE EVERY WEEK  Allergies:  Allergies  Allergen Reactions   Effexor [Venlafaxine] Other (See Comments)    Blurry vision, fatgue   Lexapro [Escitalopram Oxalate]     BAD REACTION-SUICIDAL    Family History: Family History  Problem Relation Age of Onset   Diabetes Mother    Prostate cancer Father    Migraines Father     Social History:  reports that he quit smoking about 20 years ago. His smoking use included cigarettes. He has a 15.00 pack-year smoking history. He has never used smokeless tobacco. He reports current alcohol use of about 2.0  standard drinks of alcohol per week. He reports that he does not use drugs.   Physical Exam: BP 128/80   Pulse 77   Ht  (1.727 m)   Wt 225 lb (102.1 kg)   BMI 34.21 kg/m   Constitutional:  Alert and oriented, No acute distress. HEENT: [place] AT, moist mucus membranes.  Trachea midline, no masses. Cardiovascular: No clubbing, cyanosis, or edema. Respiratory: Normal respiratory effort, no increased work of breathing. Skin: No rashes, bruises or suspicious lesions. Neurologic: Grossly intact, no focal deficits, moving all 4 extremities. Psychiatric: Normal mood and affect.  Pertinent Imaging: CT scan personally reviewed and interpreted   EXAM: CT ABDOMEN AND PELVIS WITHOUT CONTRAST   TECHNIQUE: Multidetector CT imaging of the abdomen and pelvis was performed following the standard protocol without IV contrast.   RADIATION DOSE REDUCTION: This exam was performed according to the departmental dose-optimization program which includes automated exposure control, adjustment of the mA and/or kV according to patient size and/or use of iterative reconstruction technique.   COMPARISON:  02/07/2020   FINDINGS: Lower chest: No acute abnormality.   Hepatobiliary: No solid liver abnormality is seen. No gallstones, gallbladder wall thickening, or biliary dilatation.   Pancreas: Unremarkable. No pancreatic ductal dilatation or surrounding inflammatory changes.   Spleen: Normal in size without significant abnormality.   Adrenals/Urinary Tract: Adrenal glands are unremarkable. Multiple small bilateral nonobstructive renal calculi. No ureteral calculi or hydronephrosis. Bladder is unremarkable.   Stomach/Bowel: Stomach is within normal limits. Appendix appears normal. No evidence of bowel wall thickening, distention, or inflammatory changes. Descending and sigmoid diverticulosis.   Vascular/Lymphatic: Aortic atherosclerosis. No enlarged abdominal or pelvic lymph nodes.    Reproductive: No mass or other significant abnormality.   Other: Small, fat containing right inguinal hernia.  No ascites.   Musculoskeletal: No acute or significant osseous findings.   IMPRESSION: 1. Multiple small bilateral nonobstructive renal calculi. No ureteral calculi or hydronephrosis. 2. Descending and sigmoid diverticulosis without evidence of acute diverticulitis. 3. Small, fat containing right inguinal hernia.   Aortic Atherosclerosis (ICD10-I70.0).     Electronically Signed   By: Jearld Lesch M.D.   On: 10/10/2022 13:12  Assessment & Plan:    1.  Hypogonadism Labs stable. Hematocrit was not drawn however he states he is scheduled for follow up with his PCP in 2-3 weeks and hematocrit will be drawn at that time. Lab visit in 6 months, testosterone/hematocrit. Office visit in 1 year with testosterone, PSA, and hematocrit. Xyosted refilled.  2.  Bilateral nephrolithiasis Recent episode of right flank pain, which is improving. CT was personally reviewed and interpreted. He had 2 punctate non-obstructing right renal calculi and no hydronephrosis. Return in one year with KUB and instructed to call earlier for recurrent renal colic.  I have reviewed the above documentation for accuracy and completeness, and I agree with the above.   Riki Altes, MD  [facility] [address], [place], [place] [address] [phone]

## 2022-10-26 DIAGNOSIS — H43811 Vitreous degeneration, right eye: Secondary | ICD-10-CM | POA: Diagnosis not present

## 2022-10-26 DIAGNOSIS — H35051 Retinal neovascularization, unspecified, right eye: Secondary | ICD-10-CM | POA: Diagnosis not present

## 2022-10-26 DIAGNOSIS — H35363 Drusen (degenerative) of macula, bilateral: Secondary | ICD-10-CM | POA: Diagnosis not present

## 2022-10-26 DIAGNOSIS — H5213 Myopia, bilateral: Secondary | ICD-10-CM | POA: Diagnosis not present

## 2022-11-11 ENCOUNTER — Ambulatory Visit (INDEPENDENT_AMBULATORY_CARE_PROVIDER_SITE_OTHER): Payer: 59 | Admitting: Internal Medicine

## 2022-11-11 ENCOUNTER — Encounter: Payer: Self-pay | Admitting: Internal Medicine

## 2022-11-11 VITALS — BP 120/76 | HR 75 | Temp 98.9°F | Ht 68.0 in | Wt 233.0 lb

## 2022-11-11 DIAGNOSIS — E538 Deficiency of other specified B group vitamins: Secondary | ICD-10-CM

## 2022-11-11 DIAGNOSIS — E559 Vitamin D deficiency, unspecified: Secondary | ICD-10-CM | POA: Diagnosis not present

## 2022-11-11 DIAGNOSIS — I10 Essential (primary) hypertension: Secondary | ICD-10-CM | POA: Diagnosis not present

## 2022-11-11 DIAGNOSIS — E7800 Pure hypercholesterolemia, unspecified: Secondary | ICD-10-CM

## 2022-11-11 DIAGNOSIS — R739 Hyperglycemia, unspecified: Secondary | ICD-10-CM

## 2022-11-11 DIAGNOSIS — Z0000 Encounter for general adult medical examination without abnormal findings: Secondary | ICD-10-CM

## 2022-11-11 DIAGNOSIS — Z125 Encounter for screening for malignant neoplasm of prostate: Secondary | ICD-10-CM

## 2022-11-11 DIAGNOSIS — Z1211 Encounter for screening for malignant neoplasm of colon: Secondary | ICD-10-CM | POA: Diagnosis not present

## 2022-11-11 DIAGNOSIS — E785 Hyperlipidemia, unspecified: Secondary | ICD-10-CM

## 2022-11-11 LAB — CBC WITH DIFFERENTIAL/PLATELET
Basophils Absolute: 0.1 10*3/uL (ref 0.0–0.1)
Basophils Relative: 0.7 % (ref 0.0–3.0)
Eosinophils Absolute: 0.1 10*3/uL (ref 0.0–0.7)
Eosinophils Relative: 1.3 % (ref 0.0–5.0)
HCT: 49.2 % (ref 39.0–52.0)
Hemoglobin: 16.4 g/dL (ref 13.0–17.0)
Lymphocytes Relative: 18.5 % (ref 12.0–46.0)
Lymphs Abs: 1.5 10*3/uL (ref 0.7–4.0)
MCHC: 33.4 g/dL (ref 30.0–36.0)
MCV: 89.3 fl (ref 78.0–100.0)
Monocytes Absolute: 0.6 10*3/uL (ref 0.1–1.0)
Monocytes Relative: 7.7 % (ref 3.0–12.0)
Neutro Abs: 5.6 10*3/uL (ref 1.4–7.7)
Neutrophils Relative %: 71.8 % (ref 43.0–77.0)
Platelets: 289 10*3/uL (ref 150.0–400.0)
RBC: 5.51 Mil/uL (ref 4.22–5.81)
RDW: 14.8 % (ref 11.5–15.5)
WBC: 7.9 10*3/uL (ref 4.0–10.5)

## 2022-11-11 LAB — MICROALBUMIN / CREATININE URINE RATIO
Creatinine,U: 129.3 mg/dL
Microalb Creat Ratio: 0.7 mg/g (ref 0.0–30.0)
Microalb, Ur: 0.9 mg/dL (ref 0.0–1.9)

## 2022-11-11 LAB — PSA: PSA: 1.08 ng/mL (ref 0.10–4.00)

## 2022-11-11 LAB — BASIC METABOLIC PANEL
BUN: 15 mg/dL (ref 6–23)
CO2: 27 mEq/L (ref 19–32)
Calcium: 9.4 mg/dL (ref 8.4–10.5)
Chloride: 104 mEq/L (ref 96–112)
Creatinine, Ser: 1 mg/dL (ref 0.40–1.50)
GFR: 87.5 mL/min (ref >60.00)
Glucose, Bld: 83 mg/dL (ref 70–99)
Potassium: 4 mEq/L (ref 3.5–5.1)
Sodium: 141 mEq/L (ref 135–145)

## 2022-11-11 LAB — LIPID PANEL
Cholesterol: 160 mg/dL (ref 0–200)
HDL: 56.3 mg/dL (ref >39.00)
LDL Cholesterol: 88 mg/dL (ref 0–99)
NonHDL: 103.98
Total CHOL/HDL Ratio: 3
Triglycerides: 82 mg/dL (ref 0.0–149.0)
VLDL: 16.4 mg/dL (ref 0.0–40.0)

## 2022-11-11 LAB — HEPATIC FUNCTION PANEL
ALT: 30 U/L (ref 0–53)
AST: 12 U/L (ref 0–37)
Albumin: 4.3 g/dL (ref 3.5–5.2)
Alkaline Phosphatase: 52 U/L (ref 39–117)
Bilirubin, Direct: 0.1 mg/dL (ref 0.0–0.3)
Total Bilirubin: 0.4 mg/dL (ref 0.2–1.2)
Total Protein: 7 g/dL (ref 6.0–8.3)

## 2022-11-11 LAB — URINALYSIS, ROUTINE W REFLEX MICROSCOPIC
Bilirubin Urine: NEGATIVE
Hgb urine dipstick: NEGATIVE
Ketones, ur: NEGATIVE
Leukocytes,Ua: NEGATIVE
Nitrite: NEGATIVE
Specific Gravity, Urine: 1.02 (ref 1.000–1.030)
Total Protein, Urine: NEGATIVE
Urine Glucose: NEGATIVE
Urobilinogen, UA: 0.2 (ref 0.0–1.0)
pH: 6.5 (ref 5.0–8.0)

## 2022-11-11 LAB — HEMOGLOBIN A1C: Hgb A1c MFr Bld: 5.4 % (ref 4.6–6.5)

## 2022-11-11 LAB — VITAMIN D 25 HYDROXY (VIT D DEFICIENCY, FRACTURES): VITD: 23.3 ng/mL — ABNORMAL LOW (ref 30.00–100.00)

## 2022-11-11 LAB — TSH: TSH: 1.56 u[IU]/mL (ref 0.35–5.50)

## 2022-11-11 LAB — VITAMIN B12: Vitamin B-12: 457 pg/mL (ref 211–911)

## 2022-11-11 NOTE — Assessment & Plan Note (Signed)
Last vitamin D Lab Results  Component Value Date   VD25OH 39.21 11/04/2020   Low, to start oral replacement  

## 2022-11-11 NOTE — Progress Notes (Unsigned)
Patient ID: Gabriel Moon, male   DOB: 1970/08/01, 52 y.o.   MRN: [id number]         Chief Complaint:: wellness exam and htn, hld, hyperglycemia, low vit d       HPI:  Gabriel Moon is a 52 y.o. male here for wellness exam; for shingrix at pharmacy, due for colonoscopy but prefers cologuard, o/w up to date                Also plans for likely right knee partial replacement medial maybe soon, doing cortisone then will have gel shots.  Pt denies chest pain, increased sob or doe, wheezing, orthopnea, PND, increased LE swelling, palpitations, dizziness or syncope.   Pt denies polydipsia, polyuria, or new focal neuro s/s.    Pt denies fever, wt loss, night sweats, loss of appetite, or other constitutional symptoms     Wt Readings from Last 3 Encounters:  11/11/22 233 lb (105.7 kg)  10/12/22 225 lb (102.1 kg)  10/10/22 229 lb 12.8 oz (104.2 kg)   BP Readings from Last 3 Encounters:  11/11/22 120/76  10/12/22 128/80  10/10/22 (!) 147/102   Immunization History  Administered Date(s) Administered   Hepatitis B 11/18/2008, 12/18/2008, 05/25/2009   Td 06/20/2000   Tdap 02/03/2016   There are no preventive care reminders to display for this patient.     Past Medical History:  Diagnosis Date   ANXIETY 01/13/2007   Arthritis    OSTEO, MIDFOOT   Chronic LBP 04/19/2011   Headache    HYPERLIPIDEMIA 01/13/2007   Hypertension    LOST WEIGHT, NOT ON MEDS    NEPHROLITHIASIS, HX OF 04/26/2007   Rhinitis, allergic    Testosterone deficiency    MALE HYPOGONADISM   Past Surgical History:  Procedure Laterality Date   BONE EXOSTOSIS EXCISION Right 11/27/2014   Procedure: PARTIAL EXCISION OF TALUS;  Surgeon: Recardo Evangelist, DPM;  Location: [facility];  Service: Podiatry;  Laterality: Right;    reports that he quit smoking about 20 years ago. His smoking use included cigarettes. He has a 15.00 pack-year smoking history. He has never used smokeless tobacco. He reports current alcohol use  of about 2.0 standard drinks of alcohol per week. He reports that he does not use drugs. family history includes Diabetes in his mother; Migraines in his father; Prostate cancer in his father. Allergies  Allergen Reactions   Effexor [Venlafaxine] Other (See Comments)    Blurry vision, fatgue   Lexapro [Escitalopram Oxalate]     BAD REACTION-SUICIDAL   Current Outpatient Medications on File Prior to Visit  Medication Sig Dispense Refill   ALPRAZolam (XANAX) 0.5 MG tablet TAKE 1 TABLET(0.5 MG) BY MOUTH THREE TIMES DAILY AS NEEDED 90 tablet 2   amLODipine (NORVASC) 10 MG tablet TAKE 1 TABLET(10 MG) BY MOUTH DAILY 90 tablet 3   atorvastatin (LIPITOR) 20 MG tablet TAKE 1 TABLET(20 MG) BY MOUTH DAILY 90 tablet 3   fluticasone (FLONASE) 50 MCG/ACT nasal spray SHAKE LIQUID AND USE 2 SPRAYS IN EACH NOSTRIL EVERY DAY 16 g 11   hydrochlorothiazide (MICROZIDE) 12.5 MG capsule TAKE 1 CAPSULE(12.5 MG) BY MOUTH DAILY Must keep scheduled appt for future refills 90 capsule 3   telmisartan (MICARDIS) 80 MG tablet Take 1 tablet (80 mg total) by mouth daily. 90 tablet 3   Testosterone Enanthate (XYOSTED) 100 MG/0.5ML SOAJ INJECT ONE pen SUBCUTANEOUS ONCE EVERY WEEK 2 mL 3   No current facility-administered medications on file prior to visit.  ROS:  All others reviewed and negative.  Objective        PE:  BP 120/76 (BP Location: Right Arm, Patient Position: Sitting, Cuff Size: Normal)   Pulse 75   Temp 98.9 F (37.2 C) (Oral)   Ht 5\' 8"  (1.727 m)   Wt 233 lb (105.7 kg)   SpO2 98%   BMI 35.43 kg/m                 Constitutional: Pt appears in NAD               HENT: Head: NCAT.                Right Ear: External ear normal.                 Left Ear: External ear normal.                Eyes: . Pupils are equal, round, and reactive to light. Conjunctivae and EOM are normal               Nose: without d/c or deformity               Neck: Neck supple. Gross normal ROM                Cardiovascular: Normal rate and regular rhythm.                 Pulmonary/Chest: Effort normal and breath sounds without rales or wheezing.                Abd:  Soft, NT, ND, + BS, no organomegaly               Neurological: Pt is alert. At baseline orientation, motor grossly intact               Skin: Skin is warm. No rashes, no other new lesions, LE edema - none               Psychiatric: Pt behavior is normal without agitation   Micro: none  Cardiac tracings I have personally interpreted today:  none  Pertinent Radiological findings (summarize): none   Lab Results  Component Value Date   WBC 7.9 11/11/2022   HGB 16.4 11/11/2022   HCT 49.2 11/11/2022   PLT 289.0 11/11/2022   GLUCOSE 83 11/11/2022   CHOL 160 11/11/2022   TRIG 82.0 11/11/2022   HDL 56.30 11/11/2022   LDLDIRECT 137.0 10/31/2019   LDLCALC 88 11/11/2022   ALT 30 11/11/2022   AST 12 11/11/2022   NA 141 11/11/2022   K 4.0 11/11/2022   CL 104 11/11/2022   CREATININE 1.00 11/11/2022   BUN 15 11/11/2022   CO2 27 11/11/2022   TSH 1.56 11/11/2022   PSA 1.08 11/11/2022   HGBA1C 5.4 11/11/2022   MICROALBUR 0.9 11/11/2022   Assessment/Plan:  Gabriel Moon is a 52 y.o. White or Caucasian [1] male with  has a past medical history of ANXIETY (01/13/2007), Arthritis, Chronic LBP (04/19/2011), Headache, HYPERLIPIDEMIA (01/13/2007), Hypertension, NEPHROLITHIASIS, HX OF (04/26/2007), Rhinitis, allergic, and Testosterone deficiency.  Vitamin D deficiency Last vitamin D Lab Results  Component Value Date   VD25OH 39.21 11/04/2020   Low, to start oral replacement   Encounter for general adult medical examination without abnormal findings Age and sex appropriate education and counseling updated with regular exercise and diet Referrals for preventative services - for cologuard Immunizations addressed - none needed Smoking  counseling  - none needed Evidence for depression or other mood disorder - none significant Most  recent labs reviewed. I have personally reviewed and have noted: 1) the patient's medical and social history 2) The patient's current medications and supplements 3) The patient's height, weight, and BMI have been recorded in the chart   Essential hypertension BP Readings from Last 3 Encounters:  11/11/22 120/76  10/12/22 128/80  10/10/22 (!) 147/102   Stable, pt to continue medical treatment norvasc 10 qd, hct 12.5 qd, micardis 80 qd   Hyperglycemia Lab Results  Component Value Date   HGBA1C 5.4 11/11/2022   Stable, pt to continue current medical treatment  - diet, wt control  Hyperlipidemia Lab Results  Component Value Date   LDLCALC 88 11/11/2022   Stable, pt to continue current statin lipitor 20 qd  Followup: Return in about 1 year (around 11/11/2023).  [name], MD 11/14/2022 1:59 PM [facility]

## 2022-11-11 NOTE — Patient Instructions (Signed)
Please continue all other medications as before, and refills have been done if requested.  Please have the pharmacy call with any other refills you may need.  Please continue your efforts at being more active, low cholesterol diet, and weight control.  You are otherwise up to date with prevention measures today.  Please keep your appointments with your specialists as you may have planned  You will be contacted regarding the referral for: cologuard  Please go to the LAB at the blood drawing area for the tests to be done  You will be contacted by phone if any changes need to be made immediately.  Otherwise, you will receive a letter about your results with an explanation, but please check with MyChart first.  Please remember to sign up for MyChart if you have not done so, as this will be important to you in the future with finding out test results, communicating by private email, and scheduling acute appointments online when needed.  Please make an Appointment to return for your 1 year visit, or sooner if needed 

## 2022-11-14 ENCOUNTER — Encounter: Payer: Self-pay | Admitting: Internal Medicine

## 2022-11-14 NOTE — Assessment & Plan Note (Signed)
Lab Results  Component Value Date   LDLCALC 88 11/11/2022   Stable, pt to continue current statin lipitor 20 qd

## 2022-11-14 NOTE — Assessment & Plan Note (Signed)
BP Readings from Last 3 Encounters:  11/11/22 120/76  10/12/22 128/80  10/10/22 (!) 147/102   Stable, pt to continue medical treatment norvasc 10 qd, hct 12.5 qd, micardis 80 qd

## 2022-11-14 NOTE — Assessment & Plan Note (Signed)
Lab Results  Component Value Date   HGBA1C 5.4 11/11/2022   Stable, pt to continue current medical treatment  - diet, wt control  

## 2022-11-14 NOTE — Assessment & Plan Note (Addendum)
Age and sex appropriate education and counseling updated with regular exercise and diet Referrals for preventative services - for cologuard Immunizations addressed - none needed Smoking counseling  - none needed Evidence for depression or other mood disorder - none significant Most recent labs reviewed. I have personally reviewed and have noted: 1) the patient's medical and social history 2) The patient's current medications and supplements 3) The patient's height, weight, and BMI have been recorded in the chart  

## 2022-11-18 ENCOUNTER — Ambulatory Visit: Payer: Self-pay

## 2022-11-18 DIAGNOSIS — Z0110 Encounter for examination of ears and hearing without abnormal findings: Secondary | ICD-10-CM

## 2022-11-18 DIAGNOSIS — H6123 Impacted cerumen, bilateral: Secondary | ICD-10-CM

## 2022-11-18 NOTE — Progress Notes (Signed)
Pt presents today for ear lavage in both ears. Both ears are free of cerumen. Pt tolerated well.

## 2022-11-21 ENCOUNTER — Ambulatory Visit: Payer: 59 | Admitting: Physician Assistant

## 2022-11-29 ENCOUNTER — Encounter: Payer: Self-pay | Admitting: Internal Medicine

## 2022-11-30 ENCOUNTER — Other Ambulatory Visit: Payer: Self-pay

## 2022-11-30 MED ORDER — HYDROCHLOROTHIAZIDE 12.5 MG PO CAPS: ORAL_CAPSULE | ORAL | 3 refills | Status: DC

## 2022-12-06 ENCOUNTER — Encounter: Payer: Self-pay | Admitting: Internal Medicine

## 2022-12-12 ENCOUNTER — Encounter (INDEPENDENT_AMBULATORY_CARE_PROVIDER_SITE_OTHER): Payer: 59 | Admitting: Ophthalmology

## 2022-12-12 DIAGNOSIS — I10 Essential (primary) hypertension: Secondary | ICD-10-CM

## 2022-12-12 DIAGNOSIS — H35713 Central serous chorioretinopathy, bilateral: Secondary | ICD-10-CM

## 2022-12-12 DIAGNOSIS — H35033 Hypertensive retinopathy, bilateral: Secondary | ICD-10-CM

## 2022-12-12 DIAGNOSIS — H43813 Vitreous degeneration, bilateral: Secondary | ICD-10-CM

## 2022-12-13 ENCOUNTER — Encounter: Payer: Self-pay | Admitting: Urology

## 2023-01-09 ENCOUNTER — Encounter: Payer: Self-pay | Admitting: Internal Medicine

## 2023-01-09 ENCOUNTER — Other Ambulatory Visit: Payer: Self-pay

## 2023-01-09 DIAGNOSIS — S83241A Other tear of medial meniscus, current injury, right knee, initial encounter: Secondary | ICD-10-CM | POA: Diagnosis not present

## 2023-01-09 DIAGNOSIS — M1711 Unilateral primary osteoarthritis, right knee: Secondary | ICD-10-CM | POA: Diagnosis not present

## 2023-01-09 DIAGNOSIS — M25561 Pain in right knee: Secondary | ICD-10-CM | POA: Diagnosis not present

## 2023-01-09 DIAGNOSIS — G8929 Other chronic pain: Secondary | ICD-10-CM | POA: Diagnosis not present

## 2023-01-09 MED ORDER — ATORVASTATIN CALCIUM 20 MG PO TABS: ORAL_TABLET | ORAL | 3 refills | Status: DC

## 2023-01-09 MED ORDER — AMLODIPINE BESYLATE 10 MG PO TABS: ORAL_TABLET | ORAL | 3 refills | Status: DC

## 2023-01-11 ENCOUNTER — Other Ambulatory Visit: Payer: Self-pay | Admitting: Student

## 2023-01-11 DIAGNOSIS — M1712 Unilateral primary osteoarthritis, left knee: Secondary | ICD-10-CM

## 2023-01-11 DIAGNOSIS — G8929 Other chronic pain: Secondary | ICD-10-CM

## 2023-01-11 DIAGNOSIS — M1711 Unilateral primary osteoarthritis, right knee: Secondary | ICD-10-CM

## 2023-01-11 DIAGNOSIS — S83241A Other tear of medial meniscus, current injury, right knee, initial encounter: Secondary | ICD-10-CM

## 2023-01-24 ENCOUNTER — Encounter: Payer: Self-pay | Admitting: Student

## 2023-01-25 ENCOUNTER — Other Ambulatory Visit: Payer: 59

## 2023-02-03 ENCOUNTER — Encounter: Payer: Self-pay | Admitting: Urology

## 2023-02-11 ENCOUNTER — Other Ambulatory Visit: Payer: 59

## 2023-02-11 DIAGNOSIS — G8929 Other chronic pain: Secondary | ICD-10-CM

## 2023-02-11 DIAGNOSIS — M25561 Pain in right knee: Secondary | ICD-10-CM | POA: Diagnosis not present

## 2023-02-11 DIAGNOSIS — M1711 Unilateral primary osteoarthritis, right knee: Secondary | ICD-10-CM

## 2023-02-11 DIAGNOSIS — S83241A Other tear of medial meniscus, current injury, right knee, initial encounter: Secondary | ICD-10-CM

## 2023-02-11 DIAGNOSIS — M23321 Other meniscus derangements, posterior horn of medial meniscus, right knee: Secondary | ICD-10-CM | POA: Diagnosis not present

## 2023-02-13 ENCOUNTER — Encounter: Payer: Self-pay | Admitting: Internal Medicine

## 2023-02-14 ENCOUNTER — Other Ambulatory Visit: Payer: Self-pay

## 2023-02-14 ENCOUNTER — Telehealth: Payer: Self-pay

## 2023-02-14 MED ORDER — TELMISARTAN 80 MG PO TABS: 80.0000 mg | ORAL_TABLET | Freq: Every day | ORAL | 3 refills | Status: DC

## 2023-02-14 MED ORDER — ALPRAZOLAM 0.5 MG PO TABS: 0.5000 mg | ORAL_TABLET | Freq: Three times a day (TID) | ORAL | 2 refills | Status: DC | PRN

## 2023-02-14 NOTE — Telephone Encounter (Signed)
Done erx 

## 2023-02-14 NOTE — Telephone Encounter (Signed)
none

## 2023-02-17 ENCOUNTER — Encounter: Payer: Self-pay | Admitting: Internal Medicine

## 2023-02-17 ENCOUNTER — Inpatient Hospital Stay: Payer: 59

## 2023-02-17 ENCOUNTER — Inpatient Hospital Stay (HOSPITAL_BASED_OUTPATIENT_CLINIC_OR_DEPARTMENT_OTHER): Payer: 59 | Admitting: Internal Medicine

## 2023-02-17 ENCOUNTER — Inpatient Hospital Stay: Payer: 59 | Attending: Internal Medicine

## 2023-02-17 VITALS — Ht 68.0 in

## 2023-02-17 DIAGNOSIS — D751 Secondary polycythemia: Secondary | ICD-10-CM | POA: Insufficient documentation

## 2023-02-17 DIAGNOSIS — Z87891 Personal history of nicotine dependence: Secondary | ICD-10-CM | POA: Insufficient documentation

## 2023-02-17 LAB — HEMOGLOBIN AND HEMATOCRIT, BLOOD
HCT: 46.1 % (ref 39.0–52.0)
Hemoglobin: 15.8 g/dL (ref 13.0–17.0)

## 2023-02-17 NOTE — Assessment & Plan Note (Addendum)
#  Erythrocytosis-sec to testosterone. clinically asymptomatic; JAK-2 negative.    #Today hematocrit is 46  [phlebotomy greater than 48-52 hematocrit]- symptomatically improved.  Proceed with phlebotomy given his ongoing testosterone use.  # OSA on CPAP-   # heart murmur- defer to PCP.   #Since patient is clinically stable I think is reasonable for the patient to follow-up with PCP/can follow-up with [place] as needed.  Patient comfortable with the plan; to call us if any questions or concerns in the interim. Patient will come to [place] as needed.    # DISPOSITION:  #  No phlebotomy  # follow up as needed- Dr.B

## 2023-02-17 NOTE — Progress Notes (Signed)
Appetite is good. Denies pain. Having regular phlebotomies at [facility]. Denies headaches. No symptoms. Taking testosterone injections.

## 2023-02-17 NOTE — Progress Notes (Signed)
[facility] CONSULT NOTE  Patient Care Team: Corwin Levins, MD as PCP - General Donneta Romberg Worthy Flank, MD as Consulting Physician (Internal Medicine)  CHIEF COMPLAINTS/PURPOSE OF CONSULTATION: ERYTHROCYTOSIS   HEMATOLOGY HISTORY  # ERYTHROCYTOSIS sec to testosterone [since 2017] [Dr.Stoioff] [Hb; WBC; platelets]- JAK-2 Negative  # Hypogonadism [100 injection- 5-6 years]; OSA/CPAP; social smoker  HISTORY OF PRESENTING ILLNESS: Alone.  Ambulating independently.  Gabriel Moon 52 y.o.  male pleasant patient with erythrocytosis secondary to testosterone is here for follow-up.  appetite is good. Denies pain. Having regular phlebotomies at [facility]. Denies headaches. No symptoms. Taking testosterone injection   Patient denies any flushing.  Denies any worsening fatigue.  Denies any history of thromboembolic events.   Review of Systems  Constitutional:  Negative for chills, diaphoresis, fever, malaise/fatigue and weight loss.  HENT:  Negative for nosebleeds and sore throat.   Eyes:  Negative for double vision.  Respiratory:  Negative for cough, hemoptysis, sputum production, shortness of breath and wheezing.   Cardiovascular:  Negative for chest pain, palpitations, orthopnea and leg swelling.  Gastrointestinal:  Negative for abdominal pain, blood in stool, constipation, diarrhea, heartburn, melena, nausea and vomiting.  Genitourinary:  Negative for dysuria, frequency and urgency.  Musculoskeletal:  Positive for back pain. Negative for joint pain.  Skin: Negative.  Negative for itching and rash.  Neurological:  Negative for dizziness, tingling, focal weakness, weakness and headaches.  Endo/Heme/Allergies:  Does not bruise/bleed easily.  Psychiatric/Behavioral:  Negative for depression. The patient is not nervous/anxious and does not have insomnia.      MEDICAL HISTORY:  Past Medical History:  Diagnosis Date   ANXIETY 01/13/2007   Arthritis    OSTEO, MIDFOOT    Chronic LBP 04/19/2011   Headache    HYPERLIPIDEMIA 01/13/2007   Hypertension    LOST WEIGHT, NOT ON MEDS    NEPHROLITHIASIS, HX OF 04/26/2007   Rhinitis, allergic    Testosterone deficiency    MALE HYPOGONADISM    SURGICAL HISTORY: Past Surgical History:  Procedure Laterality Date   BONE EXOSTOSIS EXCISION Right 11/27/2014   Procedure: PARTIAL EXCISION OF TALUS;  Surgeon: Recardo Evangelist, DPM;  Location: [facility];  Service: Podiatry;  Laterality: Right;    SOCIAL HISTORY: Social History   Socioeconomic History   Marital status: Divorced    Spouse name: Not on file   Number of children: Not on file   Years of education: Not on file   Highest education level: Not on file  Occupational History   Occupation: Radiographer, therapeutic service  Tobacco Use   Smoking status: Former    Current packs/day: 0.00    Average packs/day: 1 pack/day for 15.0 years (15.0 ttl pk-yrs)    Types: Cigarettes    Start date: 06/21/1987    Quit date: 06/20/2002    Years since quitting: 20.6   Smokeless tobacco: Never  Vaping Use   Vaping status: Former  Substance and Sexual Activity   Alcohol use: Yes    Alcohol/week: 2.0 standard drinks of alcohol    Types: 2 Cans of beer per week    Comment: 6 OR 7 PER MONTH   Drug use: No   Sexual activity: Yes    Birth control/protection: None  Other Topics Concern   Not on file  Social History Narrative   [place]; light smoker [1 for 10 days]; alcohol- once couple of weeks; lives in -self.     Social Determinants of Health  Financial Resource Strain: Not on file  Food Insecurity: Not on file  Transportation Needs: Not on file  Physical Activity: Not on file  Stress: Not on file  Social Connections: Not on file  Intimate Partner Violence: Not on file    FAMILY HISTORY: Family History  Problem Relation Age of Onset   Diabetes Mother    Prostate cancer Father    Migraines Father     ALLERGIES:   is allergic to effexor [venlafaxine] and lexapro [escitalopram oxalate].  MEDICATIONS:  Current Outpatient Medications  Medication Sig Dispense Refill   ALPRAZolam (XANAX) 0.5 MG tablet Take 1 tablet (0.5 mg total) by mouth 3 (three) times daily as needed for anxiety. 90 tablet 2   amLODipine (NORVASC) 10 MG tablet TAKE 1 TABLET(10 MG) BY MOUTH DAILY 90 tablet 3   atorvastatin (LIPITOR) 20 MG tablet TAKE 1 TABLET(20 MG) BY MOUTH DAILY 90 tablet 3   fluticasone (FLONASE) 50 MCG/ACT nasal spray SHAKE LIQUID AND USE 2 SPRAYS IN EACH NOSTRIL EVERY DAY 16 g 11   hydrochlorothiazide (MICROZIDE) 12.5 MG capsule TAKE 1 CAPSULE(12.5 MG) BY MOUTH DAILY Must keep scheduled appt for future refills 90 capsule 3   telmisartan (MICARDIS) 80 MG tablet Take 1 tablet (80 mg total) by mouth daily. 90 tablet 3   Testosterone Enanthate (XYOSTED) 100 MG/0.5ML SOAJ INJECT ONE pen SUBCUTANEOUS ONCE EVERY WEEK 2 mL 3   No current facility-administered medications for this visit.     Marland Kitchen  PHYSICAL EXAMINATION:   There were no vitals filed for this visit.  There were no vitals filed for this visit.   Physical Exam HENT:     Head: Normocephalic and atraumatic.     Mouth/Throat:     Pharynx: No oropharyngeal exudate.  Eyes:     Pupils: Pupils are equal, round, and reactive to light.  Cardiovascular:     Rate and Rhythm: Normal rate and regular rhythm.     Heart sounds: Murmur heard.  Pulmonary:     Effort: No respiratory distress.     Breath sounds: No wheezing.  Abdominal:     General: Bowel sounds are normal. There is no distension.     Palpations: Abdomen is soft. There is no mass.     Tenderness: There is no abdominal tenderness. There is no guarding or rebound.  Musculoskeletal:        General: No tenderness. Normal range of motion.     Cervical back: Normal range of motion and neck supple.  Skin:    General: Skin is warm.  Neurological:     Mental Status: He is alert and oriented to person,  place, and time.  Psychiatric:        Mood and Affect: Affect normal.      LABORATORY DATA:  I have reviewed the data as listed Lab Results  Component Value Date   WBC 7.9 11/11/2022   HGB 15.8 02/17/2023   HCT 46.1 02/17/2023   MCV 89.3 11/11/2022   PLT 289.0 11/11/2022   Recent Labs    04/07/22 0000 04/07/22 0822 11/11/22 0843  NA 140 141 141  K 3.8 3.8 4.0  CL 104 104 104  CO2 19* 20 27  GLUCOSE  --   --  83  BUN  --   --  15  CREATININE 0.91 0.92 1.00  CALCIUM 9.3 9.3 9.4  PROT  --   --  7.0  ALBUMIN  --   --  4.3  AST  --   --  12  ALT  --   --  30  ALKPHOS  --   --  52  BILITOT  --   --  0.4  BILIDIR  --   --  0.1     No results found.  ASSESSMENT & PLAN:   Erythrocytosis #Erythrocytosis-sec to testosterone. clinically asymptomatic; JAK-2 negative.    #Today hematocrit is 46  [phlebotomy greater than 48-52 hematocrit]- symptomatically improved.  Proceed with phlebotomy given his ongoing testosterone use.  # OSA on CPAP-   # heart murmur- defer to PCP.   #Since patient is clinically stable I think is reasonable for the patient to follow-up with PCP/can follow-up with [place] as needed.  Patient comfortable with the plan; to call us if any questions or concerns in the interim. Patient will come to [place] as needed.    # DISPOSITION:  #  No phlebotomy  # follow up as needed- Dr.B     Earna Coder, MD 02/17/2023 2:14 PM

## 2023-02-27 DIAGNOSIS — G8929 Other chronic pain: Secondary | ICD-10-CM | POA: Diagnosis not present

## 2023-02-27 DIAGNOSIS — S83241D Other tear of medial meniscus, current injury, right knee, subsequent encounter: Secondary | ICD-10-CM | POA: Diagnosis not present

## 2023-02-27 DIAGNOSIS — M25461 Effusion, right knee: Secondary | ICD-10-CM | POA: Diagnosis not present

## 2023-02-27 DIAGNOSIS — M1711 Unilateral primary osteoarthritis, right knee: Secondary | ICD-10-CM | POA: Diagnosis not present

## 2023-02-27 DIAGNOSIS — M25561 Pain in right knee: Secondary | ICD-10-CM | POA: Diagnosis not present

## 2023-02-28 ENCOUNTER — Other Ambulatory Visit: Payer: Self-pay | Admitting: Urology

## 2023-02-28 DIAGNOSIS — E291 Testicular hypofunction: Secondary | ICD-10-CM

## 2023-03-23 DIAGNOSIS — D3131 Benign neoplasm of right choroid: Secondary | ICD-10-CM | POA: Diagnosis not present

## 2023-03-23 DIAGNOSIS — H35713 Central serous chorioretinopathy, bilateral: Secondary | ICD-10-CM | POA: Diagnosis not present

## 2023-03-23 DIAGNOSIS — D3132 Benign neoplasm of left choroid: Secondary | ICD-10-CM | POA: Diagnosis not present

## 2023-04-06 ENCOUNTER — Encounter: Payer: Self-pay | Admitting: Urology

## 2023-04-07 ENCOUNTER — Other Ambulatory Visit: Payer: Self-pay

## 2023-04-07 ENCOUNTER — Other Ambulatory Visit: Payer: Self-pay | Admitting: Surgery

## 2023-04-07 ENCOUNTER — Encounter
Admission: RE | Admit: 2023-04-07 | Discharge: 2023-04-07 | Disposition: A | Discharge status: Home / Self Care | Payer: 59 | Source: Ambulatory Visit | Attending: Surgery | Admitting: Surgery

## 2023-04-07 ENCOUNTER — Other Ambulatory Visit: Payer: 59

## 2023-04-07 DIAGNOSIS — Z01818 Encounter for other preprocedural examination: Secondary | ICD-10-CM | POA: Diagnosis not present

## 2023-04-07 DIAGNOSIS — Z01810 Encounter for preprocedural cardiovascular examination: Secondary | ICD-10-CM | POA: Diagnosis present

## 2023-04-07 DIAGNOSIS — E291 Testicular hypofunction: Secondary | ICD-10-CM | POA: Diagnosis not present

## 2023-04-07 DIAGNOSIS — Z01812 Encounter for preprocedural laboratory examination: Secondary | ICD-10-CM | POA: Diagnosis present

## 2023-04-07 HISTORY — DX: Unilateral primary osteoarthritis, right knee: M17.11

## 2023-04-07 HISTORY — DX: Personal history of urinary calculi: Z87.442

## 2023-04-07 HISTORY — DX: Essential (primary) hypertension: I10

## 2023-04-07 HISTORY — DX: Pneumonia, unspecified organism: J18.9

## 2023-04-07 HISTORY — DX: Obstructive sleep apnea (adult) (pediatric): G47.33

## 2023-04-07 HISTORY — DX: Vitamin D deficiency, unspecified: E55.9

## 2023-04-07 HISTORY — DX: Central serous chorioretinopathy, bilateral: H35.713

## 2023-04-07 LAB — COMPREHENSIVE METABOLIC PANEL
ALT: 43 U/L (ref 0–44)
AST: 23 U/L (ref 15–41)
Albumin: 4.4 g/dL (ref 3.5–5.0)
Alkaline Phosphatase: 61 U/L (ref 38–126)
Anion gap: 10 (ref 5–15)
BUN: 21 mg/dL — ABNORMAL HIGH (ref 6–20)
CO2: 25 mmol/L (ref 22–32)
Calcium: 9.3 mg/dL (ref 8.9–10.3)
Chloride: 103 mmol/L (ref 98–111)
Creatinine, Ser: 0.94 mg/dL (ref 0.61–1.24)
GFR, Estimated: >60 mL/min (ref >60)
Glucose, Bld: 101 mg/dL — ABNORMAL HIGH (ref 70–99)
Potassium: 3.6 mmol/L (ref 3.5–5.1)
Sodium: 138 mmol/L (ref 135–145)
Total Bilirubin: 1 mg/dL (ref 0.3–1.2)
Total Protein: 7.7 g/dL (ref 6.5–8.1)

## 2023-04-07 LAB — CBC WITH DIFFERENTIAL/PLATELET
Abs Immature Granulocytes: 0.12 10*3/uL — ABNORMAL HIGH (ref 0.00–0.07)
Basophils Absolute: 0.1 10*3/uL (ref 0.0–0.1)
Basophils Relative: 1 %
Eosinophils Absolute: 0.1 10*3/uL (ref 0.0–0.5)
Eosinophils Relative: 2 %
HCT: 47.1 % (ref 39.0–52.0)
Hemoglobin: 16.4 g/dL (ref 13.0–17.0)
Immature Granulocytes: 2 %
Lymphocytes Relative: 20 %
Lymphs Abs: 1.5 10*3/uL (ref 0.7–4.0)
MCH: 30.2 pg (ref 26.0–34.0)
MCHC: 34.8 g/dL (ref 30.0–36.0)
MCV: 86.7 fL (ref 80.0–100.0)
Monocytes Absolute: 0.6 10*3/uL (ref 0.1–1.0)
Monocytes Relative: 8 %
Neutro Abs: 5.1 10*3/uL (ref 1.7–7.7)
Neutrophils Relative %: 67 %
Platelets: 228 10*3/uL (ref 150–400)
RBC: 5.43 MIL/uL (ref 4.22–5.81)
RDW: 14.5 % (ref 11.5–15.5)
WBC: 7.5 10*3/uL (ref 4.0–10.5)
nRBC: 0 % (ref 0.0–0.2)

## 2023-04-07 LAB — URINALYSIS, ROUTINE W REFLEX MICROSCOPIC
Bilirubin Urine: NEGATIVE
Glucose, UA: NEGATIVE mg/dL
Hgb urine dipstick: NEGATIVE
Ketones, ur: NEGATIVE mg/dL
Leukocytes,Ua: NEGATIVE
Nitrite: NEGATIVE
Protein, ur: NEGATIVE mg/dL
Specific Gravity, Urine: 1.019 (ref 1.005–1.030)
pH: 5 (ref 5.0–8.0)

## 2023-04-07 LAB — SURGICAL PCR SCREEN
MRSA, PCR: NEGATIVE
Staphylococcus aureus: NEGATIVE

## 2023-04-07 NOTE — Plan of Care (Signed)
[facility] Tonsillectomy/Adenoidectomy, Postoperative PEDS care plan entered in error.

## 2023-04-07 NOTE — Patient Instructions (Addendum)
Your procedure is scheduled on: Thursday, October 24 Report to the Registration Desk on the 1st floor of the [facility] To find out your arrival time, please call [phone] between 1PM - 3PM on: Wednesday, October 23 If your arrival time is 6:00 am, do not arrive before that time as the [facility] entrance doors do not open until 6:00 am.  REMEMBER: Instructions that are not followed completely may result in serious medical risk, up to and including death; or upon the discretion of your surgeon and anesthesiologist your surgery may need to be rescheduled.  Do not eat food after midnight the night before surgery.  No gum chewing or hard candies.  You may however, drink CLEAR liquids up to 2 hours before you are scheduled to arrive for your surgery. Do not drink anything within 2 hours of your scheduled arrival time.  Clear liquids include: - water  - apple juice without pulp - gatorade (not RED colors) - black coffee or tea (Do NOT add milk or creamers to the coffee or tea) Do NOT drink anything that is not on this list.  In addition, your doctor has ordered for you to drink the provided:  Ensure Pre-Surgery Clear Carbohydrate Drink  Drinking this carbohydrate drink up to two hours before surgery helps to reduce insulin resistance and improve patient outcomes. Please complete drinking 2 hours before scheduled arrival time.  One week prior to surgery: starting October 18 Stop Anti-inflammatories (NSAIDS) such as Advil, Aleve, Ibuprofen, Motrin, Naproxen, Naprosyn and Aspirin based products such as Excedrin, Goody's Powder, BC Powder. Stop ANY OVER THE COUNTER supplements until after surgery. Stop Vitamin B, Vitamin D, Vitamin K  You may however, continue to take Tylenol if needed for pain up until the day of surgery.  Continue taking all of your other prescription medications up until the day of surgery.  ON THE DAY OF SURGERY ONLY TAKE THESE MEDICATIONS WITH SIPS OF  WATER:  Amlodipine Atorvastatin Flonase nasal spray Hydrocodone if needed for pain  No Alcohol for 24 hours before or after surgery.  No Smoking including e-cigarettes for 24 hours before surgery.  No chewable tobacco products for at least 6 hours before surgery.  No nicotine patches on the day of surgery.  Do not use any "recreational" drugs for at least a week (preferably 2 weeks) before your surgery.  Please be advised that the combination of cocaine and anesthesia may have negative outcomes, up to and including death. If you test positive for cocaine, your surgery will be cancelled.  On the morning of surgery brush your teeth with toothpaste and water, you may rinse your mouth with mouthwash if you wish. Do not swallow any toothpaste or mouthwash.  Use CHG Soap as directed on instruction sheet.  Do not wear jewelry, make-up, hairpins, clips or nail polish.  For welded (permanent) jewelry: bracelets, anklets, waist bands, etc.  Please have this removed prior to surgery.  If it is not removed, there is a chance that hospital personnel will need to cut it off on the day of surgery.  Do not wear lotions, powders, or perfumes.   Do not shave body hair from the neck down 48 hours before surgery.  Contact lenses, hearing aids and dentures may not be worn into surgery.  Do not bring valuables to the hospital. [facility] is not responsible for any missing/lost belongings or valuables.   Bring your C-PAP to the hospital in case you may have to spend the night.  Notify your doctor if there is any change in your medical condition (cold, fever, infection).  Wear comfortable clothing (specific to your surgery type) to the hospital.  After surgery, you can help prevent lung complications by doing breathing exercises.  Take deep breaths and cough every 1-2 hours. Your doctor may order a device called an Incentive Spirometer to help you take deep breaths.  If you are being admitted to  the hospital overnight, leave your suitcase in the car. After surgery it may be brought to your room.  In case of increased patient census, it may be necessary for you, the patient, to continue your postoperative care in the Same Day Surgery department.  If you are being discharged the day of surgery, you will not be allowed to drive home. You will need a responsible individual to drive you home and stay with you for 24 hours after surgery.   If you are taking public transportation, you will need to have a responsible individual with you.  Please call the Pre-admissions Testing Dept. at [phone] if you have any questions about these instructions.  Surgery Visitation Policy:  Patients having surgery or a procedure may have two visitors.  Children under the age of 67 must have an adult with them who is not the patient.     Pre-operative 5 CHG Bath Instructions   You can play a key role in reducing the risk of infection after surgery. Your skin needs to be as free of germs as possible. You can reduce the number of germs on your skin by washing with CHG (chlorhexidine gluconate) soap before surgery. CHG is an antiseptic soap that kills germs and continues to kill germs even after washing.   DO NOT use if you have an allergy to chlorhexidine/CHG or antibacterial soaps. If your skin becomes reddened or irritated, stop using the CHG and notify one of our RNs at [phone].   Please shower with the CHG soap starting 4 days before surgery using the following schedule:     Please keep in mind the following:  DO NOT shave, including legs and underarms, starting the day of your first shower.   You may shave your face at any point before/day of surgery.  Place clean sheets on your bed the day you start using CHG soap. Use a clean washcloth (not used since being washed) for each shower. DO NOT sleep with pets once you start using the CHG.   CHG Shower Instructions:  If you choose to  wash your hair and private area, wash first with your normal shampoo/soap.  After you use shampoo/soap, rinse your hair and body thoroughly to remove shampoo/soap residue.  Turn the water OFF and apply about 3 tablespoons (45 ml) of CHG soap to a CLEAN washcloth.  Apply CHG soap ONLY FROM YOUR NECK DOWN TO YOUR TOES (washing for 3-5 minutes)  DO NOT use CHG soap on face, private areas, open wounds, or sores.  Pay special attention to the area where your surgery is being performed.  If you are having back surgery, having someone wash your back for you may be helpful. Wait 2 minutes after CHG soap is applied, then you may rinse off the CHG soap.  Pat dry with a clean towel  Put on clean clothes/pajamas   If you choose to wear lotion, please use ONLY the CHG-compatible lotions on the back of this paper.     Additional instructions for the day of surgery: DO NOT APPLY any  lotions, deodorants, cologne, or perfumes.   Put on clean/comfortable clothes.  Brush your teeth.  Ask your nurse before applying any prescription medications to the skin.      CHG Compatible Lotions   Aveeno Moisturizing lotion  Cetaphil Moisturizing Cream  Cetaphil Moisturizing Lotion  Clairol Herbal Essence Moisturizing Lotion, Dry Skin  Clairol Herbal Essence Moisturizing Lotion, Extra Dry Skin  Clairol Herbal Essence Moisturizing Lotion, Normal Skin  Curel Age Defying Therapeutic Moisturizing Lotion with Alpha Hydroxy  Curel Extreme Care Body Lotion  Curel Soothing Hands Moisturizing Hand Lotion  Curel Therapeutic Moisturizing Cream, Fragrance-Free  Curel Therapeutic Moisturizing Lotion, Fragrance-Free  Curel Therapeutic Moisturizing Lotion, Original Formula  Eucerin Daily Replenishing Lotion  Eucerin Dry Skin Therapy Plus Alpha Hydroxy Crme  Eucerin Dry Skin Therapy Plus Alpha Hydroxy Lotion  Eucerin Original Crme  Eucerin Original Lotion  Eucerin Plus Crme Eucerin Plus Lotion  Eucerin TriLipid  Replenishing Lotion  Keri Anti-Bacterial Hand Lotion  Keri Deep Conditioning Original Lotion Dry Skin Formula Softly Scented  Keri Deep Conditioning Original Lotion, Fragrance Free Sensitive Skin Formula  Keri Lotion Fast Absorbing Fragrance Free Sensitive Skin Formula  Keri Lotion Fast Absorbing Softly Scented Dry Skin Formula  Keri Original Lotion  Keri Skin Renewal Lotion Keri Silky Smooth Lotion  Keri Silky Smooth Sensitive Skin Lotion  Nivea Body Creamy Conditioning Oil  Nivea Body Extra Enriched Lotion  Nivea Body Original Lotion  Nivea Body Sheer Moisturizing Lotion Nivea Crme  Nivea Skin Firming Lotion  NutraDerm 30 Skin Lotion  NutraDerm Skin Lotion  NutraDerm Therapeutic Skin Cream  NutraDerm Therapeutic Skin Lotion  ProShield Protective Hand Cream  Provon moisturizing lotion    Preoperative Educational Videos for Total Hip, Knee and Shoulder Replacements  To better prepare for surgery, please view our videos that explain the physical activity and discharge planning required to have the best surgical recovery at [facility].  [email or web address]  Questions? Call [phone] or email [email or web address]

## 2023-04-08 LAB — TESTOSTERONE: Testosterone: 232 ng/dL — ABNORMAL LOW (ref 264–916)

## 2023-04-08 LAB — HEMATOCRIT: Hematocrit: 49.4 % (ref 37.5–51.0)

## 2023-04-11 DIAGNOSIS — M1711 Unilateral primary osteoarthritis, right knee: Secondary | ICD-10-CM | POA: Diagnosis not present

## 2023-04-12 ENCOUNTER — Encounter: Payer: Self-pay | Admitting: Urology

## 2023-04-13 ENCOUNTER — Other Ambulatory Visit: Payer: Self-pay

## 2023-04-13 ENCOUNTER — Other Ambulatory Visit: Payer: 59

## 2023-04-13 ENCOUNTER — Encounter: Payer: Self-pay | Admitting: Surgery

## 2023-04-13 ENCOUNTER — Ambulatory Visit: Payer: Self-pay | Admitting: Urgent Care

## 2023-04-13 ENCOUNTER — Ambulatory Visit
Admission: RE | Admit: 2023-04-13 | Discharge: 2023-04-13 | Disposition: A | Discharge status: Home / Self Care | Payer: 59 | Source: Ambulatory Visit | Attending: Surgery | Admitting: Surgery

## 2023-04-13 ENCOUNTER — Encounter
Admission: RE | Disposition: A | Discharge status: Home / Self Care | Payer: Self-pay | Source: Ambulatory Visit | Attending: Surgery

## 2023-04-13 ENCOUNTER — Ambulatory Visit: Payer: 59

## 2023-04-13 DIAGNOSIS — X58XXXA Exposure to other specified factors, initial encounter: Secondary | ICD-10-CM | POA: Insufficient documentation

## 2023-04-13 DIAGNOSIS — M1711 Unilateral primary osteoarthritis, right knee: Secondary | ICD-10-CM | POA: Insufficient documentation

## 2023-04-13 DIAGNOSIS — M25461 Effusion, right knee: Secondary | ICD-10-CM | POA: Diagnosis not present

## 2023-04-13 DIAGNOSIS — S83231A Complex tear of medial meniscus, current injury, right knee, initial encounter: Secondary | ICD-10-CM | POA: Insufficient documentation

## 2023-04-13 DIAGNOSIS — Z01812 Encounter for preprocedural laboratory examination: Secondary | ICD-10-CM

## 2023-04-13 DIAGNOSIS — E291 Testicular hypofunction: Secondary | ICD-10-CM

## 2023-04-13 DIAGNOSIS — Z96651 Presence of right artificial knee joint: Secondary | ICD-10-CM | POA: Diagnosis not present

## 2023-04-13 DIAGNOSIS — Z471 Aftercare following joint replacement surgery: Secondary | ICD-10-CM | POA: Diagnosis not present

## 2023-04-13 HISTORY — PX: PARTIAL KNEE ARTHROPLASTY: SHX2174

## 2023-04-13 SURGERY — ARTHROPLASTY, KNEE, UNICOMPARTMENTAL
Anesthesia: General | Site: Knee | Laterality: Right

## 2023-04-13 MED ORDER — CHLORHEXIDINE GLUCONATE 0.12 % MT SOLN
15.0000 mL | Freq: Once | OROMUCOSAL | Status: AC
  Administered 2023-04-13: 15 mL via OROMUCOSAL

## 2023-04-13 MED ORDER — AMLODIPINE BESYLATE 10 MG PO TABS: 10.0000 mg | ORAL_TABLET | Freq: Every day | ORAL | Status: DC

## 2023-04-13 MED ORDER — KETOROLAC TROMETHAMINE 30 MG/ML IJ SOLN
INTRAMUSCULAR | Status: DC | PRN
  Administered 2023-04-13: 30 mg via INTRAMUSCULAR

## 2023-04-13 MED ORDER — BUPIVACAINE-EPINEPHRINE (PF) 0.5% -1:200000 IJ SOLN
INTRAMUSCULAR | Status: AC
  Filled 2023-04-13: qty 30

## 2023-04-13 MED ORDER — SODIUM CHLORIDE 0.9 % IR SOLN
Status: DC | PRN
  Administered 2023-04-13: 3000 mL

## 2023-04-13 MED ORDER — XYOSTED 100 MG/0.5ML ~~LOC~~ SOAJ: 100.0000 mg | SUBCUTANEOUS | Status: DC

## 2023-04-13 MED ORDER — BUPIVACAINE-EPINEPHRINE (PF) 0.5% -1:200000 IJ SOLN
INTRAMUSCULAR | Status: DC | PRN
  Administered 2023-04-13: 30 mL

## 2023-04-13 MED ORDER — LACTATED RINGERS IV SOLN: INTRAVENOUS | Status: DC

## 2023-04-13 MED ORDER — CEFAZOLIN SODIUM-DEXTROSE 2-4 GM/100ML-% IV SOLN
2.0000 g | INTRAVENOUS | Status: AC
  Administered 2023-04-13: 2 g via INTRAVENOUS

## 2023-04-13 MED ORDER — SODIUM CHLORIDE FLUSH 0.9 % IV SOLN
INTRAVENOUS | Status: AC
  Filled 2023-04-13: qty 20

## 2023-04-13 MED ORDER — TRANEXAMIC ACID-NACL 1000-0.7 MG/100ML-% IV SOLN
1000.0000 mg | INTRAVENOUS | Status: AC
  Administered 2023-04-13: 1000 mg via INTRAVENOUS

## 2023-04-13 MED ORDER — BUPIVACAINE HCL (PF) 0.5 % IJ SOLN
INTRAMUSCULAR | Status: DC | PRN
  Administered 2023-04-13: 3 mL via INTRATHECAL

## 2023-04-13 MED ORDER — CEFAZOLIN SODIUM-DEXTROSE 2-4 GM/100ML-% IV SOLN
INTRAVENOUS | Status: AC
  Filled 2023-04-13: qty 100

## 2023-04-13 MED ORDER — PHENYLEPHRINE HCL-NACL 20-0.9 MG/250ML-% IV SOLN
INTRAVENOUS | Status: DC | PRN
  Administered 2023-04-13: 160 ug via INTRAVENOUS
  Administered 2023-04-13: 40 ug/min via INTRAVENOUS

## 2023-04-13 MED ORDER — ONDANSETRON HCL 4 MG/2ML IJ SOLN
INTRAMUSCULAR | Status: DC | PRN
  Administered 2023-04-13: 4 mg via INTRAVENOUS

## 2023-04-13 MED ORDER — PROPOFOL 10 MG/ML IV BOLUS
INTRAVENOUS | Status: AC
  Filled 2023-04-13: qty 40

## 2023-04-13 MED ORDER — PROPOFOL 500 MG/50ML IV EMUL
INTRAVENOUS | Status: DC | PRN
  Administered 2023-04-13: 30 mg via INTRAVENOUS
  Administered 2023-04-13: 150 ug/kg/min via INTRAVENOUS

## 2023-04-13 MED ORDER — KETOROLAC TROMETHAMINE 30 MG/ML IJ SOLN
INTRAMUSCULAR | Status: AC
  Filled 2023-04-13: qty 1

## 2023-04-13 MED ORDER — METOCLOPRAMIDE HCL 5 MG/ML IJ SOLN: 5.0000 mg | Freq: Three times a day (TID) | INTRAMUSCULAR | Status: DC | PRN

## 2023-04-13 MED ORDER — METOCLOPRAMIDE HCL 10 MG PO TABS: 5.0000 mg | ORAL_TABLET | Freq: Three times a day (TID) | ORAL | Status: DC | PRN

## 2023-04-13 MED ORDER — HYDROCHLOROTHIAZIDE 12.5 MG PO CAPS: 12.5000 mg | ORAL_CAPSULE | Freq: Every day | ORAL | Status: DC

## 2023-04-13 MED ORDER — CHLORHEXIDINE GLUCONATE 0.12 % MT SOLN
OROMUCOSAL | Status: AC
  Filled 2023-04-13: qty 15

## 2023-04-13 MED ORDER — ORAL CARE MOUTH RINSE: 15.0000 mL | Freq: Once | OROMUCOSAL | Status: AC

## 2023-04-13 MED ORDER — LACTATED RINGERS IV BOLUS
250.0000 mL | Freq: Once | INTRAVENOUS | Status: AC
  Administered 2023-04-13: 250 mL via INTRAVENOUS

## 2023-04-13 MED ORDER — OXYCODONE HCL 5 MG PO TABS
5.0000 mg | ORAL_TABLET | ORAL | Status: DC | PRN
  Administered 2023-04-13: 10 mg via ORAL

## 2023-04-13 MED ORDER — SODIUM CHLORIDE (PF) 0.9 % IJ SOLN
INTRAMUSCULAR | Status: DC | PRN
  Administered 2023-04-13: 10 mL

## 2023-04-13 MED ORDER — OXYCODONE HCL 5 MG PO TABS
ORAL_TABLET | ORAL | Status: AC
  Filled 2023-04-13: qty 1

## 2023-04-13 MED ORDER — DEXTROSE-SODIUM CHLORIDE 5-0.9 % IV SOLN: INTRAVENOUS | Status: DC

## 2023-04-13 MED ORDER — TRIAMCINOLONE ACETONIDE 40 MG/ML IJ SUSP
INTRAMUSCULAR | Status: AC
  Filled 2023-04-13: qty 2

## 2023-04-13 MED ORDER — ACETAMINOPHEN 10 MG/ML IV SOLN
INTRAVENOUS | Status: DC | PRN
  Administered 2023-04-13: 1000 mg via INTRAVENOUS

## 2023-04-13 MED ORDER — KETOROLAC TROMETHAMINE 30 MG/ML IJ SOLN
30.0000 mg | Freq: Once | INTRAMUSCULAR | Status: AC
  Administered 2023-04-13: 30 mg via INTRAVENOUS

## 2023-04-13 MED ORDER — TRANEXAMIC ACID-NACL 1000-0.7 MG/100ML-% IV SOLN
INTRAVENOUS | Status: AC
  Filled 2023-04-13: qty 100

## 2023-04-13 MED ORDER — ACETAMINOPHEN 10 MG/ML IV SOLN
INTRAVENOUS | Status: AC
  Filled 2023-04-13: qty 100

## 2023-04-13 MED ORDER — FENTANYL CITRATE (PF) 100 MCG/2ML IJ SOLN: 25.0000 ug | INTRAMUSCULAR | Status: DC | PRN

## 2023-04-13 MED ORDER — PHENYLEPHRINE HCL-NACL 20-0.9 MG/250ML-% IV SOLN
INTRAVENOUS | Status: AC
  Filled 2023-04-13: qty 250

## 2023-04-13 MED ORDER — SODIUM CHLORIDE 0.9 % BOLUS PEDS: 250.0000 mL | Freq: Once | INTRAVENOUS | Status: DC

## 2023-04-13 MED ORDER — OXYCODONE HCL 5 MG PO TABS
ORAL_TABLET | ORAL | Status: AC
  Filled 2023-04-13: qty 2

## 2023-04-13 MED ORDER — CEFAZOLIN SODIUM-DEXTROSE 2-4 GM/100ML-% IV SOLN
2.0000 g | Freq: Four times a day (QID) | INTRAVENOUS | Status: DC
  Administered 2023-04-13: 2 g via INTRAVENOUS

## 2023-04-13 MED ORDER — OXYCODONE HCL 5 MG PO TABS: 5.0000 mg | ORAL_TABLET | Freq: Once | ORAL | Status: DC | PRN

## 2023-04-13 MED ORDER — OXYCODONE HCL 5 MG PO TABS
5.0000 mg | ORAL_TABLET | ORAL | Status: AC
  Administered 2023-04-13: 5 mg via ORAL

## 2023-04-13 MED ORDER — OXYCODONE HCL 5 MG/5ML PO SOLN: 5.0000 mg | Freq: Once | ORAL | Status: DC | PRN

## 2023-04-13 MED ORDER — MIDAZOLAM HCL 5 MG/5ML IJ SOLN
INTRAMUSCULAR | Status: DC | PRN
  Administered 2023-04-13: 2 mg via INTRAVENOUS

## 2023-04-13 MED ORDER — BUPIVACAINE LIPOSOME 1.3 % IJ SUSP
INTRAMUSCULAR | Status: DC | PRN
  Administered 2023-04-13: 20 mL

## 2023-04-13 MED ORDER — FENTANYL CITRATE (PF) 100 MCG/2ML IJ SOLN
INTRAMUSCULAR | Status: AC
  Filled 2023-04-13: qty 2

## 2023-04-13 MED ORDER — TRIAMCINOLONE ACETONIDE 40 MG/ML IJ SUSP
INTRAMUSCULAR | Status: DC | PRN
  Administered 2023-04-13: 80 mg

## 2023-04-13 MED ORDER — ONDANSETRON HCL 4 MG PO TABS: 4.0000 mg | ORAL_TABLET | Freq: Four times a day (QID) | ORAL | Status: DC | PRN

## 2023-04-13 MED ORDER — SODIUM CHLORIDE 0.9 % BOLUS PEDS
250.0000 mL | Freq: Once | INTRAVENOUS | Status: DC
Start: 2023-04-13 — End: 2023-04-13

## 2023-04-13 MED ORDER — FLUTICASONE PROPIONATE 50 MCG/ACT NA SUSP: 2.0000 | Freq: Every day | NASAL | Status: DC

## 2023-04-13 MED ORDER — MIDAZOLAM HCL 2 MG/2ML IJ SOLN
INTRAMUSCULAR | Status: AC
  Filled 2023-04-13: qty 2

## 2023-04-13 MED ORDER — ATORVASTATIN CALCIUM 20 MG PO TABS: 20.0000 mg | ORAL_TABLET | Freq: Every day | ORAL | Status: DC

## 2023-04-13 MED ORDER — EPHEDRINE SULFATE-NACL 50-0.9 MG/10ML-% IV SOSY
PREFILLED_SYRINGE | INTRAVENOUS | Status: DC | PRN
  Administered 2023-04-13: 5 mg via INTRAVENOUS
  Administered 2023-04-13: 10 mg via INTRAVENOUS

## 2023-04-13 MED ORDER — ONDANSETRON HCL 4 MG/2ML IJ SOLN: 4.0000 mg | Freq: Four times a day (QID) | INTRAMUSCULAR | Status: DC | PRN

## 2023-04-13 MED ORDER — FENTANYL CITRATE (PF) 100 MCG/2ML IJ SOLN
INTRAMUSCULAR | Status: DC | PRN
  Administered 2023-04-13: 25 ug via INTRAVENOUS

## 2023-04-13 MED ORDER — BUPIVACAINE LIPOSOME 1.3 % IJ SUSP
INTRAMUSCULAR | Status: AC
  Filled 2023-04-13: qty 10

## 2023-04-13 MED ORDER — ACETAMINOPHEN 325 MG PO TABS: 325.0000 mg | ORAL_TABLET | Freq: Four times a day (QID) | ORAL | Status: DC | PRN

## 2023-04-13 MED ORDER — TELMISARTAN 80 MG PO TABS: 40.0000 mg | ORAL_TABLET | Freq: Every day | ORAL | Status: DC

## 2023-04-13 MED ORDER — PROPOFOL 1000 MG/100ML IV EMUL
INTRAVENOUS | Status: AC
  Filled 2023-04-13: qty 100

## 2023-04-13 SURGICAL SUPPLY — 64 items
APL PRP STRL LF DISP 70% ISPRP (MISCELLANEOUS) ×2
BEARING MENISCAL TIBIAL 3 MD R (Orthopedic Implant) IMPLANT
BIT DRILL QUICK REL 1/8 2PK SL (BIT) IMPLANT
BNDG CMPR 6 X 5 YARDS HK CLSR (GAUZE/BANDAGES/DRESSINGS) ×1
BNDG ELASTIC 6INX 5YD STR LF (GAUZE/BANDAGES/DRESSINGS) ×1 IMPLANT
BRNG TIB MED 3 PHS 3 RT MEN (Orthopedic Implant) ×1 IMPLANT
CEMENT BONE R 1X40 (Cement) ×1 IMPLANT
CEMENT VACUUM MIXING SYSTEM (MISCELLANEOUS) ×1 IMPLANT
CHLORAPREP W/TINT 26 (MISCELLANEOUS) ×2 IMPLANT
COOLER POLAR GLACIER W/PUMP (MISCELLANEOUS) ×1 IMPLANT
COVER MAYO STAND STRL (DRAPES) ×1 IMPLANT
CUFF TOURN SGL QUICK 24 (TOURNIQUET CUFF)
CUFF TOURN SGL QUICK 30 (TOURNIQUET CUFF) ×1
CUFF TOURN SGL QUICK 34 (TOURNIQUET CUFF)
CUFF TRNQT CYL 24X4X16.5-23 (TOURNIQUET CUFF) IMPLANT
CUFF TRNQT CYL 30X4X21-28X (TOURNIQUET CUFF) IMPLANT
CUFF TRNQT CYL 34X4.125X (TOURNIQUET CUFF) IMPLANT
DRAPE C-ARM XRAY 36X54 (DRAPES) IMPLANT
DRAPE U-SHAPE 47X51 STRL (DRAPES) ×2 IMPLANT
DRSG MEPILEX SACRM 8.7X9.8 (GAUZE/BANDAGES/DRESSINGS) ×1 IMPLANT
DRSG OPSITE POSTOP 4X12 (GAUZE/BANDAGES/DRESSINGS) ×1 IMPLANT
DRSG OPSITE POSTOP 4X6 (GAUZE/BANDAGES/DRESSINGS) ×1 IMPLANT
ELECT CAUTERY BLADE 6.4 (BLADE) ×1 IMPLANT
ELECT REM PT RETURN 9FT ADLT (ELECTROSURGICAL) ×1
ELECTRODE REM PT RTRN 9FT ADLT (ELECTROSURGICAL) ×1 IMPLANT
GAUZE SPONGE 4X4 12PLY STRL (GAUZE/BANDAGES/DRESSINGS) ×1 IMPLANT
GAUZE XEROFORM 1X8 LF (GAUZE/BANDAGES/DRESSINGS) ×1 IMPLANT
GLOVE BIO SURGEON STRL SZ7.5 (GLOVE) ×4 IMPLANT
GLOVE BIO SURGEON STRL SZ8 (GLOVE) ×4 IMPLANT
GLOVE BIOGEL PI IND STRL 8 (GLOVE) ×1 IMPLANT
GOWN STRL REUS W/ TWL LRG LVL3 (GOWN DISPOSABLE) ×1 IMPLANT
GOWN STRL REUS W/ TWL XL LVL3 (GOWN DISPOSABLE) ×1 IMPLANT
GOWN STRL REUS W/TWL LRG LVL3 (GOWN DISPOSABLE) ×1
GOWN STRL REUS W/TWL XL LVL3 (GOWN DISPOSABLE) ×1
HOOD PEEL AWAY T7 (MISCELLANEOUS) ×3 IMPLANT
IV NS IRRIG 3000ML ARTHROMATIC (IV SOLUTION) ×1 IMPLANT
KIT TURNOVER KIT A (KITS) ×1 IMPLANT
MANIFOLD NEPTUNE II (INSTRUMENTS) ×1 IMPLANT
MAT ABSORB FLUID 56X50 GRAY (MISCELLANEOUS) ×1 IMPLANT
NDL SAFETY ECLIPSE 18X1.5 (NEEDLE) ×1 IMPLANT
NDL SPNL 20GX3.5 QUINCKE YW (NEEDLE) ×1 IMPLANT
NEEDLE SPNL 20GX3.5 QUINCKE YW (NEEDLE) ×1 IMPLANT
NS IRRIG 1000ML POUR BTL (IV SOLUTION) ×1 IMPLANT
PACK BLADE SAW RECIP 70 3 PT (BLADE) IMPLANT
PACK TOTAL KNEE (MISCELLANEOUS) ×1 IMPLANT
PAD ABD DERMACEA PRESS 5X9 (GAUZE/BANDAGES/DRESSINGS) ×2 IMPLANT
PAD WRAPON POLAR KNEE (MISCELLANEOUS) ×1 IMPLANT
PEG TWIN FEM CEMENTED MED (Knees) IMPLANT
PENCIL SMOKE EVACUATOR (MISCELLANEOUS) ×1 IMPLANT
PULSAVAC PLUS IRRIG FAN TIP (DISPOSABLE) ×1
STAPLER SKIN PROX 35W (STAPLE) ×1 IMPLANT
STRAP SAFETY 5IN WIDE (MISCELLANEOUS) ×1 IMPLANT
SUCTION TUBE FRAZIER 10FR DISP (SUCTIONS) ×1 IMPLANT
SUT VIC AB 0 CT1 36 (SUTURE) ×1 IMPLANT
SUT VIC AB 2-0 CT1 27 (SUTURE) ×4
SUT VIC AB 2-0 CT1 TAPERPNT 27 (SUTURE) ×4 IMPLANT
SYR 10ML LL (SYRINGE) ×1 IMPLANT
SYR 30ML LL (SYRINGE) ×2 IMPLANT
TAPE TRANSPORE STRL 2 31045 (GAUZE/BANDAGES/DRESSINGS) ×1 IMPLANT
TIP FAN IRRIG PULSAVAC PLUS (DISPOSABLE) ×1 IMPLANT
TRAP FLUID SMOKE EVACUATOR (MISCELLANEOUS) ×1 IMPLANT
TRAY TIBIAL OXFORD SZ C RT (Joint) IMPLANT
WATER STERILE IRR 500ML POUR (IV SOLUTION) ×1 IMPLANT
WRAPON POLAR PAD KNEE (MISCELLANEOUS) ×1

## 2023-04-13 NOTE — Discharge Instructions (Addendum)
Orthopedic discharge instructions: May shower with intact OpSite dressing. Apply ice frequently to knee or use Polar Care. Start Eliquis 1 tablet (2.5 mg) twice daily on Friday, 02/01/2022, for 2 weeks, then take aspirin 325 mg twice daily for 4 weeks. Take pain medication as prescribed when needed.  May supplement with ES Tylenol if necessary. May weight-bear as tolerated on right leg - use walker for balance and support. Follow-up in 10-14 days or as scheduled.    AMBULATORY SURGERY  DISCHARGE INSTRUCTIONS   The drugs that you were given will stay in your system until tomorrow so for the next 24 hours you should not:  Drive an automobile Make any legal decisions Drink any alcoholic beverage   You may resume regular meals tomorrow.  Today it is better to start with liquids and gradually work up to solid foods.  You may eat anything you prefer, but it is better to start with liquids, then soup and crackers, and gradually work up to solid foods.   Please notify your doctor immediately if you have any unusual bleeding, trouble breathing, redness and pain at the surgery site, drainage, fever, or pain not relieved by medication.    Additional Instructions:  LEAVE GREEN ARMBAND ON FOR 4 DAYS POLAR CARE INFORMATION  MassAdvertisement.it  How to use [facility] Polar Care [facility] Therapy System?  YouTube   [email or web address].co.uk  OPERATING INSTRUCTIONS  Start the product With dry hands, connect the transformer to the electrical connection located on the top of the cooler. Next, plug the transformer into an appropriate electrical outlet. The unit will automatically start running at this point.  To stop the pump, disconnect electrical power.  Unplug to stop the product when not in use. Unplugging the Polar Care unit turns it off. Always unplug immediately after use. Never leave it plugged in while unattended. Remove pad.    FIRST ADD WATER TO FILL LINE, THEN  ICE---Replace ice when existing ice is almost melted  1 Discuss Treatment with your Licensed Health Care Practitioner and Use Only as Prescribed 2 Apply Insulation Barrier & Cold Therapy Pad 3 Check for Moisture 4 Inspect Skin Regularly  Tips and Trouble Shooting Usage Tips 1. Use cubed or chunked ice for optimal performance. 2. It is recommended to drain the Pad between uses. To drain the pad, hold the Pad upright with the hose pointed toward the ground. Depress the black plunger and allow water to drain out. 3. You may disconnect the Pad from the unit without removing the pad from the affected area by depressing the silver tabs on the hose coupling and gently pulling the hoses apart. The Pad and unit will seal itself and will not leak. Note: Some dripping during release is normal. 4. DO NOT RUN PUMP WITHOUT WATER! The pump in this unit is designed to run with water. Running the unit without water will cause permanent damage to the pump. 5. Unplug unit before removing lid.  TROUBLESHOOTING GUIDE Pump not running, Water not flowing to the pad, Pad is not getting cold 1. Make sure the transformer is plugged into the wall outlet. 2. Confirm that the ice and water are filled to the indicated levels. 3. Make sure there are no kinks in the pad. 4. Gently pull on the blue tube to make sure the tube/pad junction is straight. 5. Remove the pad from the treatment site and ll it while the pad is lying at; then reapply. 6. Confirm that the pad couplings are securely attached to  the unit. Listen for the double clicks (Figure 1) to confirm the pad couplings are securely attached.  Leaks    Note: Some condensation on the lines, controller, and pads is unavoidable, especially in warmer climates. 1. If using a Breg Polar Care Cold Therapy unit with a detachable Cold Therapy Pad, and a leak exists (other than condensation on the lines) disconnect the pad couplings. Make sure the silver tabs on the couplings  are depressed before reconnecting the pad to the pump hose; then confirm both sides of the coupling are properly clicked in. 2. If the coupling continues to leak or a leak is detected in the pad itself, stop using it and call Breg Customer Care at [phone].  Cleaning After use, empty and dry the unit with a soft cloth. Warm water and mild detergent may be used occasionally to clean the pump and tubes.  WARNING: The Polar Care Cube can be cold enough to cause serious injury, including full skin necrosis. Follow these Operating Instructions, and carefully read the Product Insert (see pouch on side of unit) and the Cold Therapy Pad Fitting Instructions (provided with each Cold Therapy Pad) prior to use.

## 2023-04-13 NOTE — Evaluation (Signed)
Physical Therapy Evaluation Patient Details Name: Gabriel Moon MRN: [id number] DOB: Jul 06, 1970 Today's Date: 04/13/2023  History of Present Illness  Patient is a 52 y.o. who presents today for a history and physical, he has an extensive history of R knee pain. PMH includes: tobacco use, anxiety, partial bone excision of talus, HLD, chronic LBP. Pt is now s/p  Right unicondylar knee arthroplasty.  Clinical Impression  Pt was pleasant and motivated to participate during the session and put forth good effort throughout. Pt is currently Ind for bed mobility, supervision for STS transfers elevated bed surface, and CGA for amb with RW. He was able to perform initial bout of 40 feet with seated rest break for stair training demonstration and education. Pt able to perform 4 steps with BUE on single hand rail use, VC's provided for step-to pattern and safe sequencing, pt showing good eccentric/concentric control. Pt able to perform additional amb bout of 130 feet while continuing to show good step through pattern and safe gait mechanics. Pt provided education on RLE positioning when seated/resting as well as safe car transfer techniques for elevated car seat height. Family/girlfriend present during all training. Pt will benefit from continued PT services upon discharge to safely address deficits listed in patient problem list for decreased caregiver assistance and eventual return to PLOF.          If plan is discharge home, recommend the following: A little help with walking and/or transfers;A little help with bathing/dressing/bathroom;Assist for transportation;Help with stairs or ramp for entrance;Assistance with cooking/housework   Can travel by private vehicle        Equipment Recommendations None recommended by PT  Recommendations for Other Services       Functional Status Assessment Patient has had a recent decline in their functional status and demonstrates the ability to make significant  improvements in function in a reasonable and predictable amount of time.     Precautions / Restrictions Precautions Precautions: Knee;Fall Precaution Booklet Issued: Yes (comment) Restrictions Weight Bearing Restrictions: Yes RLE Weight Bearing: Weight bearing as tolerated      Mobility  Bed Mobility Overal bed mobility: Independent                  Transfers Overall transfer level: Needs assistance Equipment used: Rolling walker (2 wheels) Transfers: Sit to/from Stand Sit to Stand: Supervision           General transfer comment: VC's for RLE management and positioning during STS's, bed surface elevated, unable to drop further    Ambulation/Gait Ambulation/Gait assistance: Contact guard assist Gait Distance (Feet): 40 Feet x1, 130 feet x1 Assistive device: Rolling walker (2 wheels)   Gait velocity: decreased   Pre-gait activities: Standing weight shifts, standing marches General Gait Details: Pt performed amb with initial step-to pattern, quickly progresse to step through pattern with good weight acceptance and stability  Stairs Stairs: Yes Stairs assistance: Contact guard assist Stair Management: One rail Right, Step to pattern Number of Stairs: 4 General stair comments: good eccentric/concentric control, overall stable no knee buckling or imbalancew noted  Wheelchair Mobility     Tilt Bed    Modified Rankin (Stroke Patients Only)       Balance Overall balance assessment: Needs assistance Sitting-balance support: Feet supported Sitting balance-Leahy Scale: Good     Standing balance support: Bilateral upper extremity supported, Reliant on assistive device for balance, During functional activity Standing balance-Leahy Scale: Fair Standing balance comment: static standing with RW  Pertinent Vitals/Pain Pain Assessment Pain Assessment: 0-10 Pain Score: 5  Pain Location: R leg; diffuse Pain Descriptors  / Indicators: Sore Pain Intervention(s): RN gave pain meds during session, Monitored during session, Limited activity within patient's tolerance, Ice applied    Home Living Family/patient expects to be discharged to:: Private residence Living Arrangements: Spouse/significant other Available Help at Discharge: Family;Available 24 hours/day Type of Home: House Home Access: Stairs to enter Entrance Stairs-Rails: Left;Right (unable to reach both) Entrance Stairs-Number of Steps: 3   Home Layout: Two level;Able to live on main level with bedroom/bathroom Home Equipment: Rolling Walker (2 wheels);BSC/3in1      Prior Function Prior Level of Function : Independent/Modified Independent             Mobility Comments: Community amb, No AD ADLs Comments: Ind     Extremity/Trunk Assessment   Upper Extremity Assessment Upper Extremity Assessment: Overall WFL for tasks assessed    Lower Extremity Assessment Lower Extremity Assessment: Overall WFL for tasks assessed;RLE deficits/detail RLE Deficits / Details: RLE sensation and strength all grossly WFL.       Communication   Communication Communication: No apparent difficulties  Cognition Arousal: Alert Behavior During Therapy: WFL for tasks assessed/performed Overall Cognitive Status: Within Functional Limits for tasks assessed                                          General Comments      Exercises Total Joint Exercises Ankle Circles/Pumps: Both, 10 reps, AROM Quad Sets: Right, 10 reps, AROM Long Arc Quad: Right, 10 reps, AROM Knee Flexion: Right, 10 reps, AROM Goniometric ROM: R Knee AROM: 4-110 deg Other Exercises Other Exercises: Pt educated on proper and safe stair navigation Other Exercises: Pt educated on proper positioning and management of RLE in order to prevent contracture Other Exercises: Pt educated on HEP packet and exercises Other Exercises: Pt educated on safe and proper car transfers to  elevated car seat   Assessment/Plan    PT Assessment Patient needs continued PT services  PT Problem List Decreased strength;Decreased coordination;Decreased range of motion;Decreased activity tolerance;Decreased balance;Decreased mobility;Pain       PT Treatment Interventions DME instruction;Balance training;Gait training;Stair training;Functional mobility training;Therapeutic activities;Therapeutic exercise;Patient/family education    PT Goals (Current goals can be found in the Care Plan section)  Acute Rehab PT Goals Patient Stated Goal: get home PT Goal Formulation: With patient Time For Goal Achievement: 04/26/23 Potential to Achieve Goals: Good    Frequency BID     Co-evaluation               AM-PAC PT "6 Clicks" Mobility  Outcome Measure Help needed turning from your back to your side while in a flat bed without using bedrails?: A Little Help needed moving from lying on your back to sitting on the side of a flat bed without using bedrails?: A Little Help needed moving to and from a bed to a chair (including a wheelchair)?: A Little Help needed standing up from a chair using your arms (e.g., wheelchair or bedside chair)?: A Little Help needed to walk in hospital room?: A Little Help needed climbing 3-5 steps with a railing? : A Little 6 Click Score: 18    End of Session Equipment Utilized During Treatment: Gait belt Activity Tolerance: Patient tolerated treatment well Patient left: in chair;with family/visitor present;with call bell/phone within reach Nurse Communication:  Mobility status PT Visit Diagnosis: Pain;Other abnormalities of gait and mobility (R26.89);Difficulty in walking, not elsewhere classified (R26.2) Pain - Right/Left: Right Pain - part of body: Knee    Time: 1127-1208 PT Time Calculation (min) (ACUTE ONLY): 41 min   Charges:                 Cecile Sheerer, SPT 04/13/23, 1:18 PM

## 2023-04-13 NOTE — Anesthesia Preprocedure Evaluation (Signed)
Anesthesia Evaluation  Patient identified by MRN, date of birth, ID band Patient awake    Reviewed: Allergy & Precautions, NPO status , Patient's Chart, lab work & pertinent test results  Airway Mallampati: IV  TM Distance: >3 FB Neck ROM: full    Dental  (+) Dental Advidsory Given, Chipped   Pulmonary sleep apnea and Continuous Positive Airway Pressure Ventilation , Patient abstained from smoking., former smoker   Pulmonary exam normal        Cardiovascular hypertension, negative cardio ROS Normal cardiovascular exam     Neuro/Psych  PSYCHIATRIC DISORDERS Anxiety Depression    negative neurological ROS     GI/Hepatic negative GI ROS, Neg liver ROS,,,  Endo/Other  negative endocrine ROS    Renal/GU      Musculoskeletal   Abdominal   Peds  Hematology negative hematology ROS (+)   Anesthesia Other Findings Past Medical History: 01/13/2007: ANXIETY No date: Arthritis     Comment:  OSTEO, MIDFOOT No date: Central serous retinopathy, bilateral 04/19/2011: Chronic LBP No date: Headache No date: History of kidney stones 01/13/2007: HYPERLIPIDEMIA No date: Hypertension, essential 04/26/2007: NEPHROLITHIASIS, HX OF No date: Obstructive sleep apnea on CPAP No date: Pneumonia No date: Primary osteoarthritis of right knee No date: Rhinitis, allergic No date: Testosterone deficiency     Comment:  MALE HYPOGONADISM No date: Vitamin D deficiency  Past Surgical History: 11/27/2014: BONE EXOSTOSIS EXCISION; Right     Comment:  Procedure: PARTIAL EXCISION OF TALUS;  Surgeon: Recardo Evangelist, DPM;  Location: [facility];  Service:               Podiatry;  Laterality: Right;  BMI    Body Mass Index: 36.49 kg/m      Reproductive/Obstetrics negative OB ROS                             Anesthesia Physical Anesthesia Plan  ASA: 2  Anesthesia Plan: Spinal and General    Post-op Pain Management:    Induction:   PONV Risk Score and Plan: 2 and Ondansetron, Dexamethasone, Midazolam, Propofol infusion and TIVA  Airway Management Planned: Natural Airway and Nasal Cannula  Additional Equipment:   Intra-op Plan:   Post-operative Plan:   Informed Consent: I have reviewed the patients History and Physical, chart, labs and discussed the procedure including the risks, benefits and alternatives for the proposed anesthesia with the patient or authorized representative who has indicated his/her understanding and acceptance.     Dental Advisory Given  Plan Discussed with: Anesthesiologist, CRNA and Surgeon  Anesthesia Plan Comments: (Patient reports no bleeding problems and no anticoagulant use.  Plan for spinal with backup GA  Patient consented for risks of anesthesia including but not limited to:  - adverse reactions to medications - damage to eyes, teeth, lips or other oral mucosa - nerve damage due to positioning  - risk of bleeding, infection and or nerve damage from spinal that could lead to paralysis - risk of headache or failed spinal - damage to teeth, lips or other oral mucosa - sore throat or hoarseness - damage to heart, brain, nerves, lungs, other parts of body or loss of life  Patient voiced understanding and assent.)       Anesthesia Quick Evaluation

## 2023-04-13 NOTE — Op Note (Signed)
04/13/2023  9:59 AM  Patient:   Gabriel Moon  Pre-Op Diagnosis:   Osteoarthritis of medial compartment with complex medial meniscus tear, right knee.  Post-Op Diagnosis:   Same  Procedure:   Right unicondylar knee arthroplasty.  Surgeon:   Maryagnes Amos, MD  Assistant:   Griffin Basil, RNFA  Anesthesia:   Spinal  Findings:   As above.  Complications:   None  EBL:   5 cc  Fluids:   700 cc crystalloid  UOP:   None  TT:   90 minutes at 300 mmHg  Drains:   None  Closure:   Staples  Implants:   All-cemented Biomet Oxford system with a medium femoral component, a "C" sized tibial tray, and a 3 mm meniscal bearing insert.  Brief Clinical Note:   The patient is a 52 year old male with a history of progressively worsening medial sided right knee pain. His symptoms have progressed despite medications, activity modification, etc. His history and examination are consistent with advanced degenerative joint disease as confirmed by plain radiographs while his preoperative MRI scan confirmed the presence of a displaced medial root tear with complex degenerative tearing of the body of the medial meniscus. The patient presents at this time for a right partial knee replacement.  Procedure:   The patient was brought into the operating room and a spinal placed by the anesthesiologist. The patient was lain in the supine position, then repositioned so that the non-surgical leg was placed in a flexed and abducted position in the yellow fin leg holder while the surgical extremity was placed over the Biomet leg holder. The right lower extremity was prepped with ChloraPrep solution before being draped sterilely. Preoperative antibiotics were administered. After performing a timeout to verify the appropriate surgical site, the limb was exsanguinated with an Esmarch and the tourniquet inflated to 300 mmHg.   A standard anterior approach to the knee was made through an approximately 3.5-4 inch  incision. The incision was carried down through the subcutaneous tissues to expose the superficial retinaculum. This was split the length the incision and the medial flap elevated sufficiently to expose the medial retinaculum. The medial retinaculum was incised along the medial border of the patella tendon and extended proximally along the medial border of the patella, leaving a 3-4 mm cuff of tissue. The soft tissues were elevated off the anteromedial aspect of the proximal tibia. The anterior portion of the meniscus was removed after performing a subtotal excision of the infrapatellar fat pad. The anterior cruciate ligament was inspected and found to be in excellent condition. Osteophytes were removed from the inferior pole of the patella as well as from the notch using a quarter-inch osteotome. There were significant degenerative changes of both the femur and tibia on the medial side. The medial femoral condyle was sized using the small and medium sizers. It was felt that the medium guide best optimized the contour of the femur. This was left in place and the external tibial guide positioned. The coupling device was used to connect the guide to the medial femoral condylar sizer to optimize appropriate orientation. Two guide pins were inserted into the cutting block before the coupling device and sizer were removed. The appropriate tibial cut was made using the oscillating and reciprocating saws. The piece was removed in its entirety and taken to the back table where it was sized and found to be optimally replicated by a "C" sized component. The 8 mm spacer was inserted to verify that  sufficient bone had been removed.  Attention was directed to femoral side. The intramedullary canal was accessed through a 4 mm drill hole. The intramedullary guide was positioned before the guide for the femoral condylar holes was positioned. The appropriate coupling device connected this guide to the intramedullary guide before  both drill holes were created in the distal aspect of the medial femoral condyle. The devices were removed and the posterior condylar cutting block inserted. The appropriate cut was made using the reciprocating saw and this piece removed. The #0 spigot was inserted and the initial bone milling performed. A trial femoral component was inserted and both the flexion and extension gaps measured. In flexion, the gap measured 6 mm whereas in extension, it measured 3 mm. Therefore, the #3 spigot was selected and the secondary bone milling performed. Repeat sizing demonstrated symmetric flexion and extension gaps. The bone was removed from the postero-medial and postero-lateral aspects of the femoral condyle, as well as from the beneath the collar of the spigot. Bone also was removed from the anterior portion of the femur so as to minimize any potential impingement with the meniscal bearing insert. The trial components removed and several drill holes placed into the distal femoral condyle to further augment cement fixation.  Attention was redirected to the tibial side. The "C" sized tibial tray was positioned and temporarily secured using the appropriate spiked nail. The keel was created using the bi-bladed reciprocating saw and hoe. The keeled "C" sized trial tibial tray was inserted to be sure that it seated properly. At this point, a total of 20 cc of Exparel, 2 cc of Kenalog 40 (80 mg), 30 mg of Toradol, and 30 cc of 0.5% Sensorcaine diluted out to 60 cc with normal saline was injected in and around the posterior and medial capsular tissues, as well as the peri-incisional tissues to help with postoperative pain control.  The bony surfaces were prepared for cementing by irrigating them thoroughly with bacitracin saline solution using the jet lavage system before packing them with a dry Ray-Tec sponge. Meanwhile, cement was being mixed on the back table. When the cement was ready, the tibial tray was cemented in  first. The excess cement was removed using a Public house manager after impacting it into place. Next, the femoral component was impacted into place. Again the excess cement was removed using a Public house manager. The 3 mm spacer was inserted and the knee brought into near full extension while the cement hardened. Once the cement hardened, the spacer was removed and the 3 mm meniscal bearing insert was trialed. This demonstrated excellent tracking while the knee was placed through a range of motion, and showed no evidence towards subluxation or dislocation. In addition, it did not fit too tightly. Therefore, the permanent 3 mm meniscal bearing insert was snapped into position after verifying that no cement had been retained posteriorly. Again the knee was placed through a range of motion with the findings as described above.  The wound was copiously irrigated with sterile saline solution via the jet lavage system before the retinacular layer was reapproximated using #0 Vicryl interrupted sutures. The subcutaneous tissues were closed in two layers using 2-0 Vicryl interrupted sutures before the skin was closed using staples. A sterile occlusive dressing was applied to the knee before the patient was awakened. The patient was transferred back to his hospital bed and returned to the recovery room in satisfactory condition after tolerating the procedure well. A Polar Care device was applied to the knee  as well.

## 2023-04-13 NOTE — Transfer of Care (Signed)
Immediate Anesthesia Transfer of Care Note  Patient: Gabriel Moon  Procedure(s) Performed: UNICOMPARTMENTAL KNEE (Right: Knee)  Patient Location: PACU  Anesthesia Type:Spinal  Level of Consciousness: awake, drowsy, and patient cooperative  Airway & Oxygen Therapy: Patient Spontanous Breathing  Post-op Assessment: Report given to RN and Post -op Vital signs reviewed and stable  Post vital signs: Reviewed and stable  Last Vitals:  Vitals Value Taken Time  BP 119/80 04/13/23 1000  Temp    Pulse 88 04/13/23 1005  Resp 19 04/13/23 1005  SpO2 91 % 04/13/23 1005  Vitals shown include unfiled device data.  Last Pain:  Vitals:   04/13/23 0612  TempSrc: Temporal  PainSc: 6          Complications: No notable events documented.

## 2023-04-13 NOTE — H&P (Addendum)
History of Present Illness: Gabriel Moon is a 52 y.o. who presents today for a history and physical. He is to undergo a right partial knee arthroplasty on 04/13/2023. Since patient's last visit here to clinic there is been no change in his condition. The patient expresses his desire to proceed with surgery.  The patient has an extensive history of right knee pain. He has had several cortisone injections which have not given him any relief. Insurance would not pay for viscosupplementation.The patient did undergo a right knee MRI scan which confirmed the presence of significant osteoarthritic changes involving the knee compartment however it did not reveal significant degenerative changes involving the lateral or patellofemoral compartments. The patient was given a short supply of hydrocodone to take as needed for breakthrough pain which he is currently taking. The patient continues to report aching throbbing discomfort along medial aspect the right knee especially with prolonged periods of walking or standing. The patient is quite frustrated by his continued ongoing right knee pain and would like to discuss more aggressive treatment options. The patient denies any personal history of heart attack, stroke, asthma or COPD. No personal history of blood clots. No history of diabetes.   Past Medical History: Allergic rhinitis 01/13/2007  Overview: Qualifier: Diagnosis of By: Jonny Ruiz MD, Len Blalock Last Assessment & Plan: stable overall by hx and exam, , and pt to continue medical treatment as before For flonase refill  Allergy  Anxiety state 01/13/2007  Overview: Qualifier: Diagnosis of By: Jonny Ruiz MD, Len Blalock Last Assessment & Plan: Formatting of this note may be different from the original. stable overall by history and exam, recent data reviewed with pt, and pt to continue medical treatment as before, to f/u any worsening symptoms or concerns Lab Results Component Value Date WBC 6.9 09/17/2014 HGB 17.1* 09/17/2014 HCT  49.4 09/17/2014  Essential hypertension 01/08/2015  Last Assessment & Plan: Formatting of this note may be different from the original. New onset, possibly related to recent wt gain but not clear, no s/s osa, for start amlodipine 5 qd, to f/u any worsening symptoms or concerns, f/u BP at home and next visit BP Readings from Last 3 Encounters: 01/08/15 142/90 11/27/14 124/92 09/17/14 134/88 Last Assessment & Plan: Formatting of this note may  Familial multiple lipoprotein-type hyperlipidemia 01/13/2007  Overview: Qualifier: Diagnosis of By: Jonny Ruiz MD, Len Blalock Last Assessment & Plan: Formatting of this note may be different from the original. stable overall by hx and exam, most recent data reviewed with pt, and pt to continue medical treatment as before, consider statin/declines today, for lower chol diet Lab Results Component Value Date CHOL 230* 04/19/2011 HDL 39.40 04/19/2011 LDLDIREC  Low back pain 04/19/2011  Last Assessment & Plan: stable overall by history and exam,and pt to continue medical treatment as before, to f/u any worsening symptoms or concerns  Obesity 01/08/2015  Last Assessment & Plan: D/w pt - goal BMI 25 or less, for less calories, increased activity now that he has essentially healed from recent right foot sugury  Personal history of urinary calculi 04/26/2007  Overview: Qualifier: Diagnosis of By: Jonny Ruiz MD, Len Blalock  Testosterone deficiency   Past Surgical History: FOOT SURGERY   Past Family History: Diabetes type II Mother  Prostate cancer Father  Migraines Father  Heart disease Maternal Grandmother  Alzheimer's disease Maternal Grandfather   Medications: acetaminophen (TYLENOL) 500 MG tablet Take 1,000 mg by mouth as needed for Pain  ALPRAZolam (XANAX) 0.5 MG tablet Take 0.5 mg by mouth  3 (three) times daily. 2  amLODIPine (NORVASC) 10 MG tablet Take 10 mg by mouth once daily  apixaban (ELIQUIS) 2.5 mg tablet Take 1 tablet (2.5 mg total) by mouth every 12 (twelve) hours 30  tablet 0  cetirizine (ZYRTEC) 10 MG tablet Take 10 mg by mouth once daily as needed for Allergies or Rhinitis  fluticasone (FLONASE) 50 mcg/actuation nasal spray Place 2 sprays into both nostrils once daily 1  hydroCHLOROthiazide (HYDRODIURIL) 12.5 MG tablet Take 12.5 mg by mouth once daily  loratadine (CLARITIN) 10 mg tablet Take 10 mg by mouth once daily as needed  oxyCODONE (ROXICODONE) 5 MG immediate release tablet Take 1-2 tablets (5-10 mg total) by mouth every 4 (four) hours as needed for up to 5 days 40 tablet 0  telmisartan (MICARDIS) 80 MG tablet Take 80 mg by mouth once daily  testosterone enanthate (XYOSTED) 100 mg/0.5 mL AtIn Inject 100 mg subcutaneously once a week  tiZANidine (ZANAFLEX) 4 MG tablet Take 1 tablet (4 mg total) by mouth 2 (two) times daily as needed for Muscle spasms 60 tablet 0  amLODIPine (NORVASC) 5 MG tablet Take 5 mg by mouth once daily (Patient not taking: Reported on 04/11/2023) 1  atorvastatin (LIPITOR) 20 MG tablet Take by mouth  methylPREDNISolone (MEDROL DOSEPACK) 4 mg tablet Follow package directions. (Patient not taking: Reported on 04/11/2023) 21 tablet 0  montelukast (SINGULAIR) 10 mg tablet Take 10 mg by mouth nightly (Patient not taking: Reported on 04/11/2023)   Allergies: Escitalopram Unknown  Escitalopram Oxalate Unknown  Venlafaxine Other (Blurry vision, fatigue)   Review of Systems: A comprehensive 14 point ROS was performed, reviewed, and the pertinent orthopaedic findings are documented in the HPI.  Physical Exam: BP 130/84 (BP Location: Left upper arm, Patient Position: Sitting, BP Cuff Size: Large Adult)  Ht 172.7 cm (5\' 8" )  Wt (!) 109.7 kg (241 lb 12.8 oz)  BMI 36.77 kg/m   General: Well-developed well-nourished male seen in no acute distress.   HEENT: Atraumatic,normocephalic. Pupils are equal and reactive to light. Oropharynx is clear with moist mucosa  Lungs: Clear to auscultation bilaterally   Cardiovascular: Regular  rate and rhythm. Normal S1, S2. No murmurs. No appreciable gallops or rubs. Peripheral pulses are palpable.  Abdomen: Soft, non-tender, nondistended. Bowel sounds present  Extremity: The patient does have a mild varus alignment of the right knee. Nontender to palpation along lateral joint line, moderate tender palpation along medial joint line. The patient was full right knee extension and is able to flex close to 110 degrees. The patient does have increased pain at the extremes of flexion at today's appointment. The patient has a mildly positive rotational McMurray's test. No retropatellar discomfort, negative patellar compression test. Negative varus stress test and valgus stress test involving the right knee. Negative Lachman's test. There does not feel to be significant patellofemoral crepitus with range of motion activities.   Neurological: The patient is alert and oriented Sensation to light touch appears to be intact and within normal limits Gross motor strength appeared to be equal to 5/5  Vascular : Peripheral pulses felt to be palpable. Capillary refill appears to be intact and within normal limits  Imaging: X-rays taken in [place] [facility] on 02/27/2023 showed bone-on-bone to the medial compartment. Is noted to have some osteophyte formation as well as subchondral porosis. Patella appears to be tracking well.  MRI OF THE RIGHT KNEE WITHOUT CONTRAST:  1. Large radial tear of the posterior horn medial meniscus near the  meniscal  root, with additional degenerative tearing in the posterior  horn and suspected parrot beak tear involving the midbody and  adjacent anterior horn.  2. Severe osteoarthritis especially in the medial compartment.  3. Moderate to large joint effusion with synovitis.  4. MCL bursitis.  5. Semimembranosus tendinopathy with trace semimembranosus-tibial  collateral ligament bursitis.  6. Chronic bone infarct or enchondroma in the distal femoral  metaphysis.   7. Non-fragmented osteochondral lesion along the lateral tibial  plateau. Small partial-thickness chondral defect posteriorly along  the lateral femoral condyle.   Impression: 1. Degenerative arthrosis, right knee. 2. Complex medial meniscus tear, right knee.  Plan:  1. Treatment options were discussed today with the patient. 2. MRI scan of the right knee did demonstrate evidence of a medial meniscus tear in addition to significant medial compartment osteoarthritic changes. 3. After a discussion of the risk and benefits of a total versus partial knee arthroplasty, the patient would like to proceed with a right knee partial knee arthroplasty to be performed by Dr. Joice Lofts in the future. 4. The patient was instructed on the risk and benefits of the procedure and wishes to proceed at this time. The patient does have a soft systolic murmur noted on examination today however no significant cardiac history. 5. This document will serve as a surgical history and physical for the patient. 6. The patient will follow-up per standard postop protocol. He can contact the clinic if he has any questions, new symptoms develop or symptoms worsen.  The procedure was discussed with the patient, as were the potential risks (including bleeding, infection, nerve and/or blood vessel injury, persistent or recurrent pain, failure of the hardware, instability, stiffness, progression of arthritis, need for further surgery, blood clots, strokes, heart attacks and/or arhythmias, pneumonia, etc.) and benefits. The patient states his understanding and wishes to proceed.     H&P reviewed and patient re-examined. No changes.

## 2023-04-13 NOTE — Plan of Care (Signed)
[facility] Tonsillectomy/Adenoidectomy, Postoperative PEDS care plan entered in error.

## 2023-04-13 NOTE — Anesthesia Procedure Notes (Signed)
Spinal  Patient location during procedure: OR Start time: 04/13/2023 7:30 AM End time: 04/13/2023 7:40 AM Reason for block: surgical anesthesia Staffing Performed: resident/CRNA  Anesthesiologist: Stephanie Coup, MD Resident/CRNA: Henrietta Hoover, CRNA Performed by: Henrietta Hoover, CRNA Authorized by: Stephanie Coup, MD   Preanesthetic Checklist Completed: patient identified, IV checked, site marked, risks and benefits discussed, surgical consent, monitors and equipment checked, pre-op evaluation and timeout performed Spinal Block Patient position: sitting Prep: DuraPrep Patient monitoring: heart rate, cardiac monitor, continuous pulse ox and blood pressure Approach: midline Location: L3-4 Injection technique: single-shot Needle Needle type: Whitacre  Needle gauge: 22 G Needle length: 9 cm Assessment Sensory level: T4 Events: CSF return

## 2023-04-14 ENCOUNTER — Other Ambulatory Visit: Payer: 59

## 2023-04-14 DIAGNOSIS — Z96651 Presence of right artificial knee joint: Secondary | ICD-10-CM | POA: Insufficient documentation

## 2023-04-14 NOTE — Anesthesia Postprocedure Evaluation (Signed)
Anesthesia Post Note  Patient: Gabriel Moon  Procedure(s) Performed: UNICOMPARTMENTAL KNEE (Right: Knee)  Patient location during evaluation: PACU Anesthesia Type: Spinal Level of consciousness: awake and alert Pain management: pain level controlled Vital Signs Assessment: post-procedure vital signs reviewed and stable Respiratory status: spontaneous breathing, nonlabored ventilation, respiratory function stable and patient connected to nasal cannula oxygen Cardiovascular status: blood pressure returned to baseline and stable Postop Assessment: no apparent nausea or vomiting Anesthetic complications: no  No notable events documented.   Last Vitals:  Vitals:   04/13/23 1114 04/13/23 1347  BP: 124/79 130/84  Pulse: 77 80  Resp: 16 18  Temp: 36.7 C 36.7 C  SpO2: 98% 95%    Last Pain:  Vitals:   04/13/23 1347  TempSrc: Tympanic  PainSc:                  Stephanie Coup

## 2023-04-15 DIAGNOSIS — J309 Allergic rhinitis, unspecified: Secondary | ICD-10-CM | POA: Diagnosis not present

## 2023-04-15 DIAGNOSIS — E785 Hyperlipidemia, unspecified: Secondary | ICD-10-CM | POA: Diagnosis not present

## 2023-04-15 DIAGNOSIS — Z6835 Body mass index (BMI) 35.0-35.9, adult: Secondary | ICD-10-CM | POA: Diagnosis not present

## 2023-04-15 DIAGNOSIS — Z471 Aftercare following joint replacement surgery: Secondary | ICD-10-CM | POA: Diagnosis not present

## 2023-04-15 DIAGNOSIS — F411 Generalized anxiety disorder: Secondary | ICD-10-CM | POA: Diagnosis not present

## 2023-04-15 DIAGNOSIS — G8929 Other chronic pain: Secondary | ICD-10-CM | POA: Diagnosis not present

## 2023-04-15 DIAGNOSIS — E669 Obesity, unspecified: Secondary | ICD-10-CM | POA: Diagnosis not present

## 2023-04-15 DIAGNOSIS — I10 Essential (primary) hypertension: Secondary | ICD-10-CM | POA: Diagnosis not present

## 2023-04-15 DIAGNOSIS — Z7901 Long term (current) use of anticoagulants: Secondary | ICD-10-CM | POA: Diagnosis not present

## 2023-04-15 DIAGNOSIS — N209 Urinary calculus, unspecified: Secondary | ICD-10-CM | POA: Diagnosis not present

## 2023-04-15 DIAGNOSIS — K5900 Constipation, unspecified: Secondary | ICD-10-CM | POA: Diagnosis not present

## 2023-04-15 DIAGNOSIS — M545 Low back pain, unspecified: Secondary | ICD-10-CM | POA: Diagnosis not present

## 2023-04-17 DIAGNOSIS — E785 Hyperlipidemia, unspecified: Secondary | ICD-10-CM | POA: Diagnosis not present

## 2023-04-17 DIAGNOSIS — E669 Obesity, unspecified: Secondary | ICD-10-CM | POA: Diagnosis not present

## 2023-04-17 DIAGNOSIS — Z7901 Long term (current) use of anticoagulants: Secondary | ICD-10-CM | POA: Diagnosis not present

## 2023-04-17 DIAGNOSIS — Z471 Aftercare following joint replacement surgery: Secondary | ICD-10-CM | POA: Diagnosis not present

## 2023-04-17 DIAGNOSIS — M545 Low back pain, unspecified: Secondary | ICD-10-CM | POA: Diagnosis not present

## 2023-04-17 DIAGNOSIS — K5900 Constipation, unspecified: Secondary | ICD-10-CM | POA: Diagnosis not present

## 2023-04-17 DIAGNOSIS — N209 Urinary calculus, unspecified: Secondary | ICD-10-CM | POA: Diagnosis not present

## 2023-04-17 DIAGNOSIS — G8929 Other chronic pain: Secondary | ICD-10-CM | POA: Diagnosis not present

## 2023-04-17 DIAGNOSIS — J309 Allergic rhinitis, unspecified: Secondary | ICD-10-CM | POA: Diagnosis not present

## 2023-04-17 DIAGNOSIS — F411 Generalized anxiety disorder: Secondary | ICD-10-CM | POA: Diagnosis not present

## 2023-04-17 DIAGNOSIS — Z6835 Body mass index (BMI) 35.0-35.9, adult: Secondary | ICD-10-CM | POA: Diagnosis not present

## 2023-04-17 DIAGNOSIS — I10 Essential (primary) hypertension: Secondary | ICD-10-CM | POA: Diagnosis not present

## 2023-04-19 DIAGNOSIS — Z471 Aftercare following joint replacement surgery: Secondary | ICD-10-CM | POA: Diagnosis not present

## 2023-04-19 DIAGNOSIS — J309 Allergic rhinitis, unspecified: Secondary | ICD-10-CM | POA: Diagnosis not present

## 2023-04-19 DIAGNOSIS — M545 Low back pain, unspecified: Secondary | ICD-10-CM | POA: Diagnosis not present

## 2023-04-19 DIAGNOSIS — N209 Urinary calculus, unspecified: Secondary | ICD-10-CM | POA: Diagnosis not present

## 2023-04-19 DIAGNOSIS — F411 Generalized anxiety disorder: Secondary | ICD-10-CM | POA: Diagnosis not present

## 2023-04-19 DIAGNOSIS — E785 Hyperlipidemia, unspecified: Secondary | ICD-10-CM | POA: Diagnosis not present

## 2023-04-19 DIAGNOSIS — I10 Essential (primary) hypertension: Secondary | ICD-10-CM | POA: Diagnosis not present

## 2023-04-19 DIAGNOSIS — G8929 Other chronic pain: Secondary | ICD-10-CM | POA: Diagnosis not present

## 2023-04-19 DIAGNOSIS — Z7901 Long term (current) use of anticoagulants: Secondary | ICD-10-CM | POA: Diagnosis not present

## 2023-04-19 DIAGNOSIS — K5900 Constipation, unspecified: Secondary | ICD-10-CM | POA: Diagnosis not present

## 2023-04-19 DIAGNOSIS — Z6835 Body mass index (BMI) 35.0-35.9, adult: Secondary | ICD-10-CM | POA: Diagnosis not present

## 2023-04-19 DIAGNOSIS — E669 Obesity, unspecified: Secondary | ICD-10-CM | POA: Diagnosis not present

## 2023-04-21 DIAGNOSIS — I10 Essential (primary) hypertension: Secondary | ICD-10-CM | POA: Diagnosis not present

## 2023-04-21 DIAGNOSIS — N209 Urinary calculus, unspecified: Secondary | ICD-10-CM | POA: Diagnosis not present

## 2023-04-21 DIAGNOSIS — Z7901 Long term (current) use of anticoagulants: Secondary | ICD-10-CM | POA: Diagnosis not present

## 2023-04-21 DIAGNOSIS — E669 Obesity, unspecified: Secondary | ICD-10-CM | POA: Diagnosis not present

## 2023-04-21 DIAGNOSIS — K5900 Constipation, unspecified: Secondary | ICD-10-CM | POA: Diagnosis not present

## 2023-04-21 DIAGNOSIS — Z6835 Body mass index (BMI) 35.0-35.9, adult: Secondary | ICD-10-CM | POA: Diagnosis not present

## 2023-04-21 DIAGNOSIS — M545 Low back pain, unspecified: Secondary | ICD-10-CM | POA: Diagnosis not present

## 2023-04-21 DIAGNOSIS — E785 Hyperlipidemia, unspecified: Secondary | ICD-10-CM | POA: Diagnosis not present

## 2023-04-21 DIAGNOSIS — G8929 Other chronic pain: Secondary | ICD-10-CM | POA: Diagnosis not present

## 2023-04-21 DIAGNOSIS — F411 Generalized anxiety disorder: Secondary | ICD-10-CM | POA: Diagnosis not present

## 2023-04-21 DIAGNOSIS — J309 Allergic rhinitis, unspecified: Secondary | ICD-10-CM | POA: Diagnosis not present

## 2023-04-21 DIAGNOSIS — Z471 Aftercare following joint replacement surgery: Secondary | ICD-10-CM | POA: Diagnosis not present

## 2023-04-22 DIAGNOSIS — Z7901 Long term (current) use of anticoagulants: Secondary | ICD-10-CM | POA: Diagnosis not present

## 2023-04-22 DIAGNOSIS — K5900 Constipation, unspecified: Secondary | ICD-10-CM | POA: Diagnosis not present

## 2023-04-22 DIAGNOSIS — M545 Low back pain, unspecified: Secondary | ICD-10-CM | POA: Diagnosis not present

## 2023-04-22 DIAGNOSIS — G8929 Other chronic pain: Secondary | ICD-10-CM | POA: Diagnosis not present

## 2023-04-22 DIAGNOSIS — Z471 Aftercare following joint replacement surgery: Secondary | ICD-10-CM | POA: Diagnosis not present

## 2023-04-22 DIAGNOSIS — N209 Urinary calculus, unspecified: Secondary | ICD-10-CM | POA: Diagnosis not present

## 2023-04-22 DIAGNOSIS — Z6835 Body mass index (BMI) 35.0-35.9, adult: Secondary | ICD-10-CM | POA: Diagnosis not present

## 2023-04-22 DIAGNOSIS — E785 Hyperlipidemia, unspecified: Secondary | ICD-10-CM | POA: Diagnosis not present

## 2023-04-22 DIAGNOSIS — E669 Obesity, unspecified: Secondary | ICD-10-CM | POA: Diagnosis not present

## 2023-04-22 DIAGNOSIS — J309 Allergic rhinitis, unspecified: Secondary | ICD-10-CM | POA: Diagnosis not present

## 2023-04-22 DIAGNOSIS — I10 Essential (primary) hypertension: Secondary | ICD-10-CM | POA: Diagnosis not present

## 2023-04-22 DIAGNOSIS — F411 Generalized anxiety disorder: Secondary | ICD-10-CM | POA: Diagnosis not present

## 2023-04-24 DIAGNOSIS — E785 Hyperlipidemia, unspecified: Secondary | ICD-10-CM | POA: Diagnosis not present

## 2023-04-24 DIAGNOSIS — J309 Allergic rhinitis, unspecified: Secondary | ICD-10-CM | POA: Diagnosis not present

## 2023-04-24 DIAGNOSIS — N209 Urinary calculus, unspecified: Secondary | ICD-10-CM | POA: Diagnosis not present

## 2023-04-24 DIAGNOSIS — F411 Generalized anxiety disorder: Secondary | ICD-10-CM | POA: Diagnosis not present

## 2023-04-24 DIAGNOSIS — Z471 Aftercare following joint replacement surgery: Secondary | ICD-10-CM | POA: Diagnosis not present

## 2023-04-24 DIAGNOSIS — Z7901 Long term (current) use of anticoagulants: Secondary | ICD-10-CM | POA: Diagnosis not present

## 2023-04-24 DIAGNOSIS — I10 Essential (primary) hypertension: Secondary | ICD-10-CM | POA: Diagnosis not present

## 2023-04-24 DIAGNOSIS — E669 Obesity, unspecified: Secondary | ICD-10-CM | POA: Diagnosis not present

## 2023-04-24 DIAGNOSIS — G8929 Other chronic pain: Secondary | ICD-10-CM | POA: Diagnosis not present

## 2023-04-24 DIAGNOSIS — M545 Low back pain, unspecified: Secondary | ICD-10-CM | POA: Diagnosis not present

## 2023-04-24 DIAGNOSIS — Z6835 Body mass index (BMI) 35.0-35.9, adult: Secondary | ICD-10-CM | POA: Diagnosis not present

## 2023-04-24 DIAGNOSIS — K5900 Constipation, unspecified: Secondary | ICD-10-CM | POA: Diagnosis not present

## 2023-04-26 DIAGNOSIS — Z96651 Presence of right artificial knee joint: Secondary | ICD-10-CM | POA: Diagnosis not present

## 2023-04-28 DIAGNOSIS — Z96651 Presence of right artificial knee joint: Secondary | ICD-10-CM | POA: Diagnosis not present

## 2023-05-03 DIAGNOSIS — Z96651 Presence of right artificial knee joint: Secondary | ICD-10-CM | POA: Diagnosis not present

## 2023-05-05 DIAGNOSIS — Z96651 Presence of right artificial knee joint: Secondary | ICD-10-CM | POA: Diagnosis not present

## 2023-05-09 DIAGNOSIS — Z96651 Presence of right artificial knee joint: Secondary | ICD-10-CM | POA: Diagnosis not present

## 2023-05-11 DIAGNOSIS — Z96651 Presence of right artificial knee joint: Secondary | ICD-10-CM | POA: Diagnosis not present

## 2023-05-15 ENCOUNTER — Encounter (INDEPENDENT_AMBULATORY_CARE_PROVIDER_SITE_OTHER): Payer: 59 | Admitting: Ophthalmology

## 2023-05-15 DIAGNOSIS — H35713 Central serous chorioretinopathy, bilateral: Secondary | ICD-10-CM

## 2023-05-15 DIAGNOSIS — H43813 Vitreous degeneration, bilateral: Secondary | ICD-10-CM

## 2023-05-15 DIAGNOSIS — H35033 Hypertensive retinopathy, bilateral: Secondary | ICD-10-CM

## 2023-05-15 DIAGNOSIS — H2513 Age-related nuclear cataract, bilateral: Secondary | ICD-10-CM

## 2023-05-15 DIAGNOSIS — I10 Essential (primary) hypertension: Secondary | ICD-10-CM | POA: Diagnosis not present

## 2023-05-16 DIAGNOSIS — Z96651 Presence of right artificial knee joint: Secondary | ICD-10-CM | POA: Diagnosis not present

## 2023-05-23 DIAGNOSIS — Z96651 Presence of right artificial knee joint: Secondary | ICD-10-CM | POA: Diagnosis not present

## 2023-05-25 DIAGNOSIS — Z96651 Presence of right artificial knee joint: Secondary | ICD-10-CM | POA: Diagnosis not present

## 2023-05-26 DIAGNOSIS — Z96651 Presence of right artificial knee joint: Secondary | ICD-10-CM | POA: Diagnosis not present

## 2023-05-28 ENCOUNTER — Other Ambulatory Visit: Payer: Self-pay | Admitting: Internal Medicine

## 2023-05-29 ENCOUNTER — Other Ambulatory Visit: Payer: Self-pay

## 2023-05-30 DIAGNOSIS — Z96651 Presence of right artificial knee joint: Secondary | ICD-10-CM | POA: Diagnosis not present

## 2023-06-01 DIAGNOSIS — Z96651 Presence of right artificial knee joint: Secondary | ICD-10-CM | POA: Diagnosis not present

## 2023-06-06 DIAGNOSIS — Z96651 Presence of right artificial knee joint: Secondary | ICD-10-CM | POA: Diagnosis not present

## 2023-06-07 DIAGNOSIS — Z96651 Presence of right artificial knee joint: Secondary | ICD-10-CM | POA: Diagnosis not present

## 2023-06-08 DIAGNOSIS — M25561 Pain in right knee: Secondary | ICD-10-CM | POA: Diagnosis not present

## 2023-06-08 DIAGNOSIS — Z96651 Presence of right artificial knee joint: Secondary | ICD-10-CM | POA: Diagnosis not present

## 2023-06-12 DIAGNOSIS — Z96651 Presence of right artificial knee joint: Secondary | ICD-10-CM | POA: Diagnosis not present

## 2023-06-16 ENCOUNTER — Other Ambulatory Visit: Payer: Self-pay | Admitting: Student

## 2023-06-16 DIAGNOSIS — M1711 Unilateral primary osteoarthritis, right knee: Secondary | ICD-10-CM

## 2023-06-16 DIAGNOSIS — S83242A Other tear of medial meniscus, current injury, left knee, initial encounter: Secondary | ICD-10-CM

## 2023-06-16 DIAGNOSIS — Z96651 Presence of right artificial knee joint: Secondary | ICD-10-CM | POA: Diagnosis not present

## 2023-06-19 DIAGNOSIS — Z96651 Presence of right artificial knee joint: Secondary | ICD-10-CM | POA: Diagnosis not present

## 2023-06-26 ENCOUNTER — Other Ambulatory Visit: Payer: Self-pay | Admitting: Urology

## 2023-06-26 DIAGNOSIS — E291 Testicular hypofunction: Secondary | ICD-10-CM

## 2023-07-01 ENCOUNTER — Ambulatory Visit
Admission: RE | Admit: 2023-07-01 | Discharge: 2023-07-01 | Disposition: A | Discharge status: Home / Self Care | Payer: 59 | Source: Ambulatory Visit | Attending: Student | Admitting: Student

## 2023-07-01 ENCOUNTER — Encounter: Payer: Self-pay | Admitting: Internal Medicine

## 2023-07-01 DIAGNOSIS — M25562 Pain in left knee: Secondary | ICD-10-CM | POA: Diagnosis not present

## 2023-07-01 DIAGNOSIS — S83242A Other tear of medial meniscus, current injury, left knee, initial encounter: Secondary | ICD-10-CM

## 2023-07-01 DIAGNOSIS — M1711 Unilateral primary osteoarthritis, right knee: Secondary | ICD-10-CM

## 2023-07-01 DIAGNOSIS — M23322 Other meniscus derangements, posterior horn of medial meniscus, left knee: Secondary | ICD-10-CM | POA: Diagnosis not present

## 2023-07-06 ENCOUNTER — Other Ambulatory Visit: Payer: Self-pay | Admitting: Internal Medicine

## 2023-07-06 ENCOUNTER — Other Ambulatory Visit: Payer: Self-pay

## 2023-07-07 DIAGNOSIS — Z96651 Presence of right artificial knee joint: Secondary | ICD-10-CM | POA: Diagnosis not present

## 2023-07-07 DIAGNOSIS — S83232A Complex tear of medial meniscus, current injury, left knee, initial encounter: Secondary | ICD-10-CM | POA: Insufficient documentation

## 2023-07-07 DIAGNOSIS — M170 Bilateral primary osteoarthritis of knee: Secondary | ICD-10-CM | POA: Diagnosis not present

## 2023-07-13 ENCOUNTER — Other Ambulatory Visit: Payer: Self-pay | Admitting: Internal Medicine

## 2023-07-14 ENCOUNTER — Ambulatory Visit
Admission: RE | Admit: 2023-07-14 | Discharge: 2023-07-14 | Disposition: A | Discharge status: Home / Self Care | Payer: 59 | Attending: Urology | Admitting: Urology

## 2023-07-14 ENCOUNTER — Ambulatory Visit
Admission: RE | Admit: 2023-07-14 | Discharge: 2023-07-14 | Disposition: A | Discharge status: Home / Self Care | Payer: 59 | Source: Ambulatory Visit | Attending: Urology | Admitting: Urology

## 2023-07-14 ENCOUNTER — Other Ambulatory Visit: Payer: Self-pay | Admitting: Physician Assistant

## 2023-07-14 ENCOUNTER — Ambulatory Visit (INDEPENDENT_AMBULATORY_CARE_PROVIDER_SITE_OTHER): Payer: 59 | Admitting: Physician Assistant

## 2023-07-14 VITALS — BP 136/90 | HR 81

## 2023-07-14 DIAGNOSIS — R109 Unspecified abdominal pain: Secondary | ICD-10-CM

## 2023-07-14 DIAGNOSIS — Z87442 Personal history of urinary calculi: Secondary | ICD-10-CM

## 2023-07-14 DIAGNOSIS — N200 Calculus of kidney: Secondary | ICD-10-CM | POA: Insufficient documentation

## 2023-07-14 DIAGNOSIS — I878 Other specified disorders of veins: Secondary | ICD-10-CM | POA: Diagnosis not present

## 2023-07-14 LAB — URINALYSIS, COMPLETE
Bilirubin, UA: NEGATIVE
Glucose, UA: NEGATIVE
Ketones, UA: NEGATIVE
Leukocytes,UA: NEGATIVE
Nitrite, UA: NEGATIVE
Protein,UA: NEGATIVE
Specific Gravity, UA: 1.015 (ref 1.005–1.030)
Urobilinogen, Ur: 0.2 mg/dL (ref 0.2–1.0)
pH, UA: 7 (ref 5.0–7.5)

## 2023-07-14 LAB — MICROSCOPIC EXAMINATION: Epithelial Cells (non renal): NONE SEEN /[HPF] (ref 0–10)

## 2023-07-14 MED ORDER — KETOROLAC TROMETHAMINE 60 MG/2ML IM SOLN: 15.0000 mg | Freq: Once | INTRAMUSCULAR | Status: AC

## 2023-07-14 MED ORDER — OXYCODONE-ACETAMINOPHEN 5-325 MG PO TABS: 1.0000 | ORAL_TABLET | Freq: Four times a day (QID) | ORAL | 0 refills | Status: AC | PRN

## 2023-07-14 MED ORDER — TAMSULOSIN HCL 0.4 MG PO CAPS: 0.4000 mg | ORAL_CAPSULE | Freq: Every day | ORAL | 0 refills | Status: DC

## 2023-07-14 MED ORDER — ONDANSETRON 4 MG PO TBDP: 4.0000 mg | ORAL_TABLET | Freq: Three times a day (TID) | ORAL | 0 refills | Status: DC | PRN

## 2023-07-14 NOTE — Progress Notes (Signed)
IM Injection  Patient is present today for an IM Injection for treatment of toradol Drug: toradol Dose:15 mg Location:[facility] Lot: [id number] Exp:01/28/2024 Patient tolerated well, no complications were noted  Performed by: Randa Lynn, RMA

## 2023-07-14 NOTE — Progress Notes (Signed)
07/14/2023 4:43 PM   Gabriel Moon 1970-11-13 [id number]  CC: Chief Complaint  Patient presents with   Follow-up   HPI: Gabriel Moon is a 54 y.o. male with PMH hypogonadism on Xyosted and recurrent nephrolithiasis who presents today for evaluation of a possible acute stone episode.   Today he reports sudden onset left flank pain overnight.  He has taken Tylenol and his pain is now rated 6/10 in intensity.  No fever, chills, nausea, or vomiting.  He typically passes his stones spontaneously.  No radiopaque ureteral stone seen on KUB today.  I do see multiple left renal stones.  In-office UA today positive for trace intact blood; urine microscopy with 3-10 RBCs/HPF.  PMH: Past Medical History:  Diagnosis Date   ANXIETY 01/13/2007   Arthritis    OSTEO, MIDFOOT   Central serous retinopathy, bilateral    Chronic LBP 04/19/2011   Headache    History of kidney stones    HYPERLIPIDEMIA 01/13/2007   Hypertension, essential    NEPHROLITHIASIS, HX OF 04/26/2007   Obstructive sleep apnea on CPAP    Pneumonia    Primary osteoarthritis of right knee    Rhinitis, allergic    Testosterone deficiency    MALE HYPOGONADISM   Vitamin D deficiency     Surgical History: Past Surgical History:  Procedure Laterality Date   BONE EXOSTOSIS EXCISION Right 11/27/2014   Procedure: PARTIAL EXCISION OF TALUS;  Surgeon: Recardo Evangelist, DPM;  Location: [facility];  Service: Podiatry;  Laterality: Right;   PARTIAL KNEE ARTHROPLASTY Right 04/13/2023   Procedure: UNICOMPARTMENTAL KNEE;  Surgeon: Christena Flake, MD;  Location: [facility];  Service: Orthopedics;  Laterality: Right;    Home Medications:  Allergies as of 07/14/2023       Reactions   Effexor [venlafaxine] Other (See Comments)   Blurry vision, fatgue   Lexapro [escitalopram Oxalate]    BAD REACTION-SUICIDAL        Medication List        Accurate as of July 14, 2023  4:43 PM. If you have any questions, ask  your nurse or doctor.          STOP taking these medications    VITAMIN K2-VITAMIN D3 PO Stopped by: Carman Ching       TAKE these medications    ALPRAZolam 0.5 MG tablet Commonly known as: XANAX TAKE 1 TABLET(0.5 MG) BY MOUTH THREE TIMES DAILY AS NEEDED FOR ANXIETY   amLODipine 10 MG tablet Commonly known as: NORVASC Take 1 tablet (10 mg total) by mouth daily. TAKE 1 TABLET(10 MG) BY MOUTH DAILY   atorvastatin 20 MG tablet Commonly known as: LIPITOR Take 1 tablet (20 mg total) by mouth daily. TAKE 1 TABLET(20 MG) BY MOUTH DAILY   fluticasone 50 MCG/ACT nasal spray Commonly known as: FLONASE SHAKE LIQUID AND USE 2 SPRAYS IN EACH NOSTRIL EVERY DAY   hydrochlorothiazide 12.5 MG capsule Commonly known as: MICROZIDE Take 1 capsule (12.5 mg total) by mouth daily. TAKE 1 CAPSULE(12.5 MG) BY MOUTH DAILY Must keep scheduled appt for future refills   loratadine 10 MG tablet Commonly known as: CLARITIN Take 10 mg by mouth daily as needed for allergies.   ondansetron 4 MG disintegrating tablet Commonly known as: ZOFRAN-ODT Take 1 tablet (4 mg total) by mouth every 8 (eight) hours as needed for nausea or vomiting. Started by: Carman Ching   oxyCODONE-acetaminophen 5-325 MG tablet Commonly known as: PERCOCET/ROXICET Take 1-2 tablets by mouth every 6 (six)  hours as needed for up to 5 days for severe pain (pain score 7-10). Started by: Carman Ching   tamsulosin 0.4 MG Caps capsule Commonly known as: FLOMAX Take 1 capsule (0.4 mg total) by mouth daily. Started by: Carman Ching   telmisartan 80 MG tablet Commonly known as: Micardis Take 0.5 tablets (40 mg total) by mouth daily.   VITAMIN B6 PO Take 1 tablet by mouth daily. 500 mg   Xyosted 100 MG/0.5ML Soaj Generic drug: Testosterone Enanthate INJECT ONE pen SUBCUTANEOUS ONCE EVERY WEEK        Allergies:  Allergies  Allergen Reactions   Effexor [Venlafaxine] Other (See  Comments)    Blurry vision, fatgue   Lexapro [Escitalopram Oxalate]     BAD REACTION-SUICIDAL    Family History: Family History  Problem Relation Age of Onset   Diabetes Mother    Prostate cancer Father    Migraines Father     Social History:   reports that he quit smoking about 21 years ago. His smoking use included cigarettes. He started smoking about 36 years ago. He has a 15 pack-year smoking history. He has never used smokeless tobacco. He reports current alcohol use of about 2.0 standard drinks of alcohol per week. He reports that he does not use drugs.  Physical Exam: BP (!) 136/90   Pulse 81   Constitutional:  Alert and oriented, no acute distress, nontoxic appearing HEENT: [place], AT Cardiovascular: No clubbing, cyanosis, or edema Respiratory: Normal respiratory effort, no increased work of breathing Skin: No rashes, bruises or suspicious lesions Neurologic: Grossly intact, no focal deficits, moving all 4 extremities Psychiatric: Normal mood and affect  Laboratory Data: Results for orders placed or performed in visit on 07/14/23  Microscopic Examination   Collection Time: 07/14/23  1:27 PM   Urine  Result Value Ref Range   WBC, UA 0-5 0 - 5 /hpf   RBC, Urine 3-10 (A) 0 - 2 /hpf   Epithelial Cells (non renal) None seen 0 - 10 /hpf   Bacteria, UA Few None seen/Few  Urinalysis, Complete   Collection Time: 07/14/23  1:27 PM  Result Value Ref Range   Specific Gravity, UA 1.015 1.005 - 1.030   pH, UA 7.0 5.0 - 7.5   Color, UA Yellow Yellow   Appearance Ur Clear Clear   Leukocytes,UA Negative Negative   Protein,UA Negative Negative/Trace   Glucose, UA Negative Negative   Ketones, UA Negative Negative   RBC, UA Trace (A) Negative   Bilirubin, UA Negative Negative   Urobilinogen, Ur 0.2 0.2 - 1.0 mg/dL   Nitrite, UA Negative Negative   Microscopic Examination See below:     Pertinent Imaging: KUB, 07/14/2023: See epic  I personally reviewed the images referenced  above and note multiple left renal stones with no radiopaque ureteral stones.  Assessment & Plan:   1. Flank pain with history of urolithiasis (Primary) Sudden onset left flank pain and microscopic hematuria today consistent with an acute stone episode.  I do not see any ureteral stones on KUB.  Will start empiric Flomax and I am sending in Percocet and Zofran for symptom control.  Will see him back in 2 weeks for follow-up with a CT stone study prior.  I also gave him IM Toradol in clinic today for pain control.  We discussed return precautions including fever, uncontrolled pain, and uncontrolled nausea/vomiting.  Will send urine for culture out of an abundance of caution. - Urinalysis, Complete - CULTURE, URINE COMPREHENSIVE -  tamsulosin (FLOMAX) 0.4 MG CAPS capsule; Take 1 capsule (0.4 mg total) by mouth daily.  Dispense: 30 capsule; Refill: 0 - oxyCODONE-acetaminophen (PERCOCET/ROXICET) 5-325 MG tablet; Take 1-2 tablets by mouth every 6 (six) hours as needed for up to 5 days for severe pain (pain score 7-10).  Dispense: 12 tablet; Refill: 0 - ondansetron (ZOFRAN-ODT) 4 MG disintegrating tablet; Take 1 tablet (4 mg total) by mouth every 8 (eight) hours as needed for nausea or vomiting.  Dispense: 20 tablet; Refill: 0 - CT RENAL STONE STUDY; Future - ketorolac (TORADOL) injection 15 mg  Return in about 2 weeks (around 07/28/2023) for Stone f/u with UA + CT prior.  Carman Ching, PA-C  [facility] [address] [place], [place] [address] [phone]

## 2023-07-14 NOTE — Patient Instructions (Signed)
For the next 2 weeks, please do the following: -Stay well hydrated -Take Flomax 0.4mg  daily -Treat any pain with Advil (ibuprofen), Tylenol (acetaminophen), or Percocet -Treat any nausea with Zofran  I will plan to see you back in clinic in 2 weeks with a CT scan prior (the imaging department will call you to schedule this) to see if you have passed your stone.  Please call our office immediately (we are open 8a-5p Monday-Friday) or go to the Emergency Department if you develop any of the following: -Fever/chills -Nausea and/or vomiting uncontrollable with Zofran -Pain uncontrollable with Percocet

## 2023-07-17 DIAGNOSIS — M1712 Unilateral primary osteoarthritis, left knee: Secondary | ICD-10-CM | POA: Diagnosis not present

## 2023-07-17 DIAGNOSIS — Z96651 Presence of right artificial knee joint: Secondary | ICD-10-CM | POA: Diagnosis not present

## 2023-07-17 DIAGNOSIS — S83232A Complex tear of medial meniscus, current injury, left knee, initial encounter: Secondary | ICD-10-CM | POA: Diagnosis not present

## 2023-07-18 LAB — CULTURE, URINE COMPREHENSIVE

## 2023-07-24 ENCOUNTER — Ambulatory Visit
Admission: RE | Admit: 2023-07-24 | Discharge: 2023-07-24 | Disposition: A | Discharge status: Home / Self Care | Payer: 59 | Source: Ambulatory Visit | Attending: Physician Assistant | Admitting: Physician Assistant

## 2023-07-24 DIAGNOSIS — R109 Unspecified abdominal pain: Secondary | ICD-10-CM | POA: Insufficient documentation

## 2023-07-24 DIAGNOSIS — N202 Calculus of kidney with calculus of ureter: Secondary | ICD-10-CM | POA: Diagnosis not present

## 2023-07-24 DIAGNOSIS — Z87442 Personal history of urinary calculi: Secondary | ICD-10-CM | POA: Diagnosis not present

## 2023-07-24 DIAGNOSIS — K7689 Other specified diseases of liver: Secondary | ICD-10-CM | POA: Diagnosis not present

## 2023-07-24 DIAGNOSIS — K5730 Diverticulosis of large intestine without perforation or abscess without bleeding: Secondary | ICD-10-CM | POA: Diagnosis not present

## 2023-07-28 ENCOUNTER — Other Ambulatory Visit: Payer: Self-pay | Admitting: Surgery

## 2023-07-28 ENCOUNTER — Encounter: Payer: Self-pay | Admitting: Internal Medicine

## 2023-07-28 ENCOUNTER — Ambulatory Visit: Payer: 59 | Admitting: Physician Assistant

## 2023-07-28 VITALS — BP 116/75 | HR 80 | Ht 68.0 in | Wt 240.0 lb

## 2023-07-28 DIAGNOSIS — Z09 Encounter for follow-up examination after completed treatment for conditions other than malignant neoplasm: Secondary | ICD-10-CM | POA: Diagnosis not present

## 2023-07-28 DIAGNOSIS — N200 Calculus of kidney: Secondary | ICD-10-CM

## 2023-07-28 DIAGNOSIS — Z87442 Personal history of urinary calculi: Secondary | ICD-10-CM | POA: Diagnosis not present

## 2023-07-28 LAB — MICROSCOPIC EXAMINATION

## 2023-07-28 LAB — URINALYSIS, COMPLETE
Bilirubin, UA: NEGATIVE
Glucose, UA: NEGATIVE
Ketones, UA: NEGATIVE
Leukocytes,UA: NEGATIVE
Nitrite, UA: NEGATIVE
Protein,UA: NEGATIVE
Specific Gravity, UA: 1.02 (ref 1.005–1.030)
Urobilinogen, Ur: 0.2 mg/dL (ref 0.2–1.0)
pH, UA: 5.5 (ref 5.0–7.5)

## 2023-07-28 NOTE — Progress Notes (Signed)
 07/28/2023 2:59 PM   Gabriel Moon [birth year] [id number]  CC: Chief Complaint  Patient presents with   Nephrolithiasis   HPI: Gabriel Moon is a 53 y.o. male with PMH hypogonadism on Xyosted  and recurrent nephrolithiasis who presents today for follow-up of a possible acute stone episode.   Today he reports he felt something pass the day after he last saw me in clinic 2 weeks ago.  He was unable to capture anything for analysis.  His pain has completely resolved and he has no acute concerns today.  CT stone study dated 07/24/2023 shows a 3 mm stone near the left UVJ with no left hydroureteronephrosis.  He denies having passed a stone since.  In-office UA today positive for trace intact blood; urine microscopy pan negative.  PMH: Past Medical History:  Diagnosis Date   ANXIETY 01/13/2007   Arthritis    OSTEO, MIDFOOT   Central serous retinopathy, bilateral    Chronic LBP 04/19/2011   Headache    History of kidney stones    HYPERLIPIDEMIA 01/13/2007   Hypertension, essential    NEPHROLITHIASIS, HX OF 04/26/2007   Obstructive sleep apnea on CPAP    Pneumonia    Primary osteoarthritis of right knee    Rhinitis, allergic    Testosterone  deficiency    MALE HYPOGONADISM   Vitamin D  deficiency     Surgical History: Past Surgical History:  Procedure Laterality Date   BONE EXOSTOSIS EXCISION Right 11/27/2014   Procedure: PARTIAL EXCISION OF TALUS;  Surgeon: Donnice Cory, DPM;  Location: [facility];  Service: Podiatry;  Laterality: Right;   PARTIAL KNEE ARTHROPLASTY Right 04/13/2023   Procedure: UNICOMPARTMENTAL KNEE;  Surgeon: Edie Norleen PARAS, MD;  Location: [facility];  Service: Orthopedics;  Laterality: Right;    Home Medications:  Allergies as of 07/28/2023       Reactions   Effexor  [venlafaxine ] Other (See Comments)   Blurry vision, fatgue   Lexapro  [escitalopram  Oxalate]    BAD REACTION-SUICIDAL        Medication List        Accurate as of  July 28, 2023  2:59 PM. If you have any questions, ask your nurse or doctor.          STOP taking these medications    tamsulosin  0.4 MG Caps capsule Commonly known as: FLOMAX  Stopped by: Lucie Hones   telmisartan  80 MG tablet Commonly known as: Micardis  Stopped by: Lucie Hones       TAKE these medications    acetaminophen  500 MG tablet Commonly known as: TYLENOL  Take 1,000 mg by mouth every 6 (six) hours as needed for mild pain (pain score 1-3) or moderate pain (pain score 4-6).   Allergy Eye 0.025-0.3 % ophthalmic solution Generic drug: naphazoline-pheniramine Place 1 drop into both eyes 4 (four) times daily as needed for allergies.   ALPRAZolam  0.5 MG tablet Commonly known as: XANAX  TAKE 1 TABLET(0.5 MG) BY MOUTH THREE TIMES DAILY AS NEEDED FOR ANXIETY   amLODipine  10 MG tablet Commonly known as: NORVASC  Take 1 tablet (10 mg total) by mouth daily. TAKE 1 TABLET(10 MG) BY MOUTH DAILY   atorvastatin  20 MG tablet Commonly known as: LIPITOR Take 1 tablet (20 mg total) by mouth daily. TAKE 1 TABLET(20 MG) BY MOUTH DAILY   fluticasone  50 MCG/ACT nasal spray Commonly known as: FLONASE  SHAKE LIQUID AND USE 2 SPRAYS IN EACH NOSTRIL EVERY DAY What changed: See the new instructions.   hydrochlorothiazide  12.5 MG capsule Commonly known  as: MICROZIDE  Take 1 capsule (12.5 mg total) by mouth daily. TAKE 1 CAPSULE(12.5 MG) BY MOUTH DAILY Must keep scheduled appt for future refills   loratadine  10 MG tablet Commonly known as: CLARITIN  Take 10 mg by mouth daily as needed for allergies.   MILK THISTLE PO Take 1 tablet by mouth daily.   ondansetron  4 MG disintegrating tablet Commonly known as: ZOFRAN -ODT Take 1 tablet (4 mg total) by mouth every 8 (eight) hours as needed for nausea or vomiting.   VITAMIN B-6 PO Take 500 mg by mouth daily.   VITAMIN K2-VITAMIN D3 PO Take 1 tablet by mouth daily.   Xyosted  100 MG/0.5ML Soaj Generic drug:  Testosterone  Enanthate INJECT ONE pen SUBCUTANEOUS ONCE EVERY WEEK        Allergies:  Allergies  Allergen Reactions   Effexor  [Venlafaxine ] Other (See Comments)    Blurry vision, fatgue   Lexapro  [Escitalopram  Oxalate]     BAD REACTION-SUICIDAL    Family History: Family History  Problem Relation Age of Onset   Diabetes Mother    Prostate cancer Father    Migraines Father     Social History:   reports that he quit smoking about 21 years ago. His smoking use included cigarettes. He started smoking about 36 years ago. He has a 15 pack-year smoking history. He has never used smokeless tobacco. He reports current alcohol use of about 2.0 standard drinks of alcohol per week. He reports that he does not use drugs.  Physical Exam: BP 116/75   Pulse 80   Ht 5' 8 (1.727 m)   Wt 240 lb (108.9 kg)   BMI 36.49 kg/m   Constitutional:  Alert and oriented, no acute distress, nontoxic appearing HEENT: [place], AT Cardiovascular: No clubbing, cyanosis, or edema Respiratory: Normal respiratory effort, no increased work of breathing Skin: No rashes, bruises or suspicious lesions Neurologic: Grossly intact, no focal deficits, moving all 4 extremities Psychiatric: Normal mood and affect  Laboratory Data: Results for orders placed or performed in visit on 07/28/23  Microscopic Examination   Collection Time: 07/28/23  9:53 AM   Urine  Result Value Ref Range   WBC, UA 0-5 0 - 5 /hpf   RBC, Urine 0-2 0 - 2 /hpf   Epithelial Cells (non renal) 0-10 0 - 10 /hpf   Bacteria, UA Few None seen/Few  Urinalysis, Complete   Collection Time: 07/28/23  9:53 AM  Result Value Ref Range   Specific Gravity, UA 1.020 1.005 - 1.030   pH, UA 5.5 5.0 - 7.5   Color, UA Yellow Yellow   Appearance Ur Clear Clear   Leukocytes,UA Negative Negative   Protein,UA Negative Negative/Trace   Glucose, UA Negative Negative   Ketones, UA Negative Negative   RBC, UA Trace (A) Negative   Bilirubin, UA Negative  Negative   Urobilinogen, Ur 0.2 0.2 - 1.0 mg/dL   Nitrite, UA Negative Negative   Microscopic Examination See below:     Pertinent Imaging: CT stone study, 07/24/2023: See epic  I personally reviewed the images referenced above and note a 3 mm stone near the left UVJ with no left hydroureteronephrosis.  Assessment & Plan:   1. Nephrolithiasis (Primary) He felt a stone pass but was unable to capture it a day after I last saw him in clinic.  He is now asymptomatic and UA is bland.  CT stone study from earlier this week shows a 3 mm stone near the left UVJ, however on my personal review I  do not believe it is within the left UVJ.  I suspect this is a bladder stone that will pass spontaneously.  Will call him when his CT is officially read to discuss any possible additional intervention that may be needed. - Urinalysis, Complete   Return for Will call with results.  Lucie Hones, PA-C  [facility]  [address], [address] [place], [place] [address] [phone]

## 2023-07-31 ENCOUNTER — Encounter
Admission: RE | Admit: 2023-07-31 | Discharge: 2023-07-31 | Disposition: A | Discharge status: Home / Self Care | Payer: 59 | Source: Ambulatory Visit | Attending: Surgery | Admitting: Surgery

## 2023-07-31 VITALS — BP 120/80 | HR 82 | Resp 14 | Ht 68.0 in | Wt 240.1 lb

## 2023-07-31 DIAGNOSIS — Z01818 Encounter for other preprocedural examination: Secondary | ICD-10-CM | POA: Insufficient documentation

## 2023-07-31 DIAGNOSIS — Z01812 Encounter for preprocedural laboratory examination: Secondary | ICD-10-CM

## 2023-07-31 DIAGNOSIS — Z79899 Other long term (current) drug therapy: Secondary | ICD-10-CM

## 2023-07-31 DIAGNOSIS — T502X5A Adverse effect of carbonic-anhydrase inhibitors, benzothiadiazides and other diuretics, initial encounter: Secondary | ICD-10-CM

## 2023-07-31 LAB — COMPREHENSIVE METABOLIC PANEL
ALT: 36 U/L (ref 0–44)
AST: 25 U/L (ref 15–41)
Albumin: 4.3 g/dL (ref 3.5–5.0)
Alkaline Phosphatase: 59 U/L (ref 38–126)
Anion gap: 12 (ref 5–15)
BUN: 12 mg/dL (ref 6–20)
CO2: 24 mmol/L (ref 22–32)
Calcium: 9.2 mg/dL (ref 8.9–10.3)
Chloride: 103 mmol/L (ref 98–111)
Creatinine, Ser: 0.97 mg/dL (ref 0.61–1.24)
GFR, Estimated: >60 mL/min (ref >60)
Glucose, Bld: 102 mg/dL — ABNORMAL HIGH (ref 70–99)
Potassium: 3 mmol/L — ABNORMAL LOW (ref 3.5–5.1)
Sodium: 139 mmol/L (ref 135–145)
Total Bilirubin: 0.9 mg/dL (ref 0.0–1.2)
Total Protein: 7.2 g/dL (ref 6.5–8.1)

## 2023-07-31 LAB — URINALYSIS, ROUTINE W REFLEX MICROSCOPIC
Bilirubin Urine: NEGATIVE
Glucose, UA: NEGATIVE mg/dL
Hgb urine dipstick: NEGATIVE
Ketones, ur: NEGATIVE mg/dL
Leukocytes,Ua: NEGATIVE
Nitrite: NEGATIVE
Protein, ur: NEGATIVE mg/dL
Specific Gravity, Urine: 1.015 (ref 1.005–1.030)
pH: 7 (ref 5.0–8.0)

## 2023-07-31 LAB — SURGICAL PCR SCREEN
MRSA, PCR: NEGATIVE
Staphylococcus aureus: NEGATIVE

## 2023-07-31 MED ORDER — POTASSIUM CHLORIDE CRYS ER 20 MEQ PO TBCR: EXTENDED_RELEASE_TABLET | ORAL | 0 refills | Status: DC

## 2023-07-31 NOTE — Patient Instructions (Addendum)
 Your procedure is scheduled on:08-08-23 Tuesday Report to the Registration Desk on the 1st floor of the [facility].Then proceed to the 2nd floor Surgery Desk To find out your arrival time, please call [phone] between 1PM - 3PM on:08-07-23 Monday If your arrival time is 6:00 am, do not arrive before that time as the [facility] entrance doors do not open until 6:00 am.  REMEMBER: Instructions that are not followed completely may result in serious medical risk, up to and including death; or upon the discretion of your surgeon and anesthesiologist your surgery may need to be rescheduled.  Do not eat food after midnight the night before surgery.  No gum chewing or hard candies.  You may however, drink CLEAR liquids up to 2 hours before you are scheduled to arrive for your surgery. Do not drink anything within 2 hours of your scheduled arrival time.  Clear liquids include: - water  - apple juice without pulp - gatorade (not RED colors) - black coffee or tea (Do NOT add milk or creamers to the coffee or tea) Do NOT drink anything that is not on this list  In addition, your doctor has ordered for you to drink the provided:  Ensure Pre-Surgery Clear Carbohydrate Drink Drinking this carbohydrate drink up to two hours before surgery helps to reduce insulin resistance and improve patient outcomes. Please complete drinking 2 hours before scheduled arrival time.  One week prior to surgery:Stop NOW (07-31-23) Stop Anti-inflammatories (NSAIDS) such as Advil , Aleve, Ibuprofen , Motrin , Naproxen, Naprosyn and Aspirin based products such as Excedrin, Goody's Powder, BC Powder. Stop ANY OVER THE COUNTER supplements until after surgery (Milk Thistle, Vitamin B6, Vitamin K2-D3)  You may however, continue to take Tylenol  if needed for pain up until the day of surgery.  Continue taking all of your other prescription medications up until the day of surgery.  ON THE DAY OF SURGERY ONLY TAKE THESE  MEDICATIONS WITH SIPS OF WATER: -amLODipine  (NORVASC )  -atorvastatin  (LIPITOR)  -You may take ALPRAZolam  (XANAX ) if needed for anxiety  No Alcohol for 24 hours before or after surgery.  No Smoking including e-cigarettes for 24 hours before surgery.  No chewable tobacco products for at least 6 hours before surgery.  No nicotine patches on the day of surgery.  Do not use any "recreational" drugs for at least a week (preferably 2 weeks) before your surgery.  Please be advised that the combination of cocaine and anesthesia may have negative outcomes, up to and including death. If you test positive for cocaine, your surgery will be cancelled.  On the morning of surgery brush your teeth with toothpaste and water, you may rinse your mouth with mouthwash if you wish. Do not swallow any toothpaste or mouthwash.  Use CHG Soap as directed on instruction sheet.  Do not wear jewelry, make-up, hairpins, clips or nail polish.  For welded (permanent) jewelry: bracelets, anklets, waist bands, etc.  Please have this removed prior to surgery.  If it is not removed, there is a chance that hospital personnel will need to cut it off on the day of surgery.  Do not wear lotions, powders, or perfumes.   Do not shave body hair from the neck down 48 hours before surgery.  Contact lenses, hearing aids and dentures may not be worn into surgery.  Do not bring valuables to the hospital. [facility] is not responsible for any missing/lost belongings or valuables.   Notify your doctor if there is any change in your medical  condition (cold, fever, infection).  Wear comfortable clothing (specific to your surgery type) to the hospital.  After surgery, you can help prevent lung complications by doing breathing exercises.  Take deep breaths and cough every 1-2 hours. Your doctor may order a device called an Incentive Spirometer to help you take deep breaths. When coughing or sneezing, hold a pillow firmly against  your incision with both hands. This is called "splinting." Doing this helps protect your incision. It also decreases belly discomfort.  If you are being admitted to the hospital overnight, leave your suitcase in the car. After surgery it may be brought to your room.  In case of increased patient census, it may be necessary for you, the patient, to continue your postoperative care in the Same Day Surgery department.  If you are being discharged the day of surgery, you will not be allowed to drive home. You will need a responsible individual to drive you home and stay with you for 24 hours after surgery.   If you are taking public transportation, you will need to have a responsible individual with you.  Please call the Pre-admissions Testing Dept. at [phone] if you have any questions about these instructions.  Surgery Visitation Policy:  Patients having surgery or a procedure may have two visitors.  Children under the age of 81 must have an adult with them who is not the patient.  Temporary Visitor Restrictions Due to increasing cases of flu, RSV and COVID-19: Children ages 60 and under will not be able to visit patients in [facility] hospitals under most circumstances.     Pre-operative 5 CHG Bath Instructions   You can play a key role in reducing the risk of infection after surgery. Your skin needs to be as free of germs as possible. You can reduce the number of germs on your skin by washing with CHG (chlorhexidine  gluconate) soap before surgery. CHG is an antiseptic soap that kills germs and continues to kill germs even after washing.   DO NOT use if you have an allergy to chlorhexidine /CHG or antibacterial soaps. If your skin becomes reddened or irritated, stop using the CHG and notify one of our RNs at [phone].   Please shower with the CHG soap starting 4 days before surgery using the following schedule:     Please keep in mind the following:  DO NOT shave,  including legs and underarms, starting the day of your first shower.   You may shave your face at any point before/day of surgery.  Place clean sheets on your bed the day you start using CHG soap. Use a clean washcloth (not used since being washed) for each shower. DO NOT sleep with pets once you start using the CHG.   CHG Shower Instructions:  If you choose to wash your hair and private area, wash first with your normal shampoo/soap.  After you use shampoo/soap, rinse your hair and body thoroughly to remove shampoo/soap residue.  Turn the water OFF and apply about 3 tablespoons (45 ml) of CHG soap to a CLEAN washcloth.  Apply CHG soap ONLY FROM YOUR NECK DOWN TO YOUR TOES (washing for 3-5 minutes)  DO NOT use CHG soap on face, private areas, open wounds, or sores.  Pay special attention to the area where your surgery is being performed.  If you are having back surgery, having someone wash your back for you may be helpful. Wait 2 minutes after CHG soap is applied, then you may rinse off  the CHG soap.  Pat dry with a clean towel  Put on clean clothes/pajamas   If you choose to wear lotion, please use ONLY the CHG-compatible lotions on the back of this paper.     Additional instructions for the day of surgery: DO NOT APPLY any lotions, deodorants, cologne, or perfumes.   Put on clean/comfortable clothes.  Brush your teeth.  Ask your nurse before applying any prescription medications to the skin.      CHG Compatible Lotions   Aveeno Moisturizing lotion  Cetaphil Moisturizing Cream  Cetaphil Moisturizing Lotion  Clairol Herbal Essence Moisturizing Lotion, Dry Skin  Clairol Herbal Essence Moisturizing Lotion, Extra Dry Skin  Clairol Herbal Essence Moisturizing Lotion, Normal Skin  Curel Age Defying Therapeutic Moisturizing Lotion with Alpha Hydroxy  Curel Extreme Care Body Lotion  Curel Soothing Hands Moisturizing Hand Lotion  Curel Therapeutic Moisturizing Cream, Fragrance-Free   Curel Therapeutic Moisturizing Lotion, Fragrance-Free  Curel Therapeutic Moisturizing Lotion, Original Formula  Eucerin Daily Replenishing Lotion  Eucerin Dry Skin Therapy Plus Alpha Hydroxy Crme  Eucerin Dry Skin Therapy Plus Alpha Hydroxy Lotion  Eucerin Original Crme  Eucerin Original Lotion  Eucerin Plus Crme Eucerin Plus Lotion  Eucerin TriLipid Replenishing Lotion  Keri Anti-Bacterial Hand Lotion  Keri Deep Conditioning Original Lotion Dry Skin Formula Softly Scented  Keri Deep Conditioning Original Lotion, Fragrance Free Sensitive Skin Formula  Keri Lotion Fast Absorbing Fragrance Free Sensitive Skin Formula  Keri Lotion Fast Absorbing Softly Scented Dry Skin Formula  Keri Original Lotion  Keri Skin Renewal Lotion Keri Silky Smooth Lotion  Keri Silky Smooth Sensitive Skin Lotion  Nivea Body Creamy Conditioning Oil  Nivea Body Extra Enriched Lotion  Nivea Body Original Lotion  Nivea Body Sheer Moisturizing Lotion Nivea Crme  Nivea Skin Firming Lotion  NutraDerm 30 Skin Lotion  NutraDerm Skin Lotion  NutraDerm Therapeutic Skin Cream  NutraDerm Therapeutic Skin Lotion  ProShield Protective Hand Cream  Provon moisturizing lotion  How to Use an Incentive Spirometer An incentive spirometer is a tool that measures how well you are filling your lungs with each breath. Learning to take long, deep breaths using this tool can help you keep your lungs clear and active. This may help to reverse or lessen your chance of developing breathing (pulmonary) problems, especially infection. You may be asked to use a spirometer: After a surgery. If you have a lung problem or a history of smoking. After a long period of time when you have been unable to move or be active. If the spirometer includes an indicator to show the highest number that you have reached, your health care provider or respiratory therapist will help you set a goal. Keep a log of your progress as told by your health care  provider. What are the risks? Breathing too quickly may cause dizziness or cause you to pass out. Take your time so you do not get dizzy or light-headed. If you are in pain, you may need to take pain medicine before doing incentive spirometry. It is harder to take a deep breath if you are having pain. How to use your incentive spirometer  Sit up on the edge of your bed or on a chair. Hold the incentive spirometer so that it is in an upright position. Before you use the spirometer, breathe out normally. Place the mouthpiece in your mouth. Make sure your lips are closed tightly around it. Breathe in slowly and as deeply as you can through your mouth, causing the piston or the  ball to rise toward the top of the chamber. Hold your breath for 3-5 seconds, or for as long as possible. If the spirometer includes a coach indicator, use this to guide you in breathing. Slow down your breathing if the indicator goes above the marked areas. Remove the mouthpiece from your mouth and breathe out normally. The piston or ball will return to the bottom of the chamber. Rest for a few seconds, then repeat the steps 10 or more times. Take your time and take a few normal breaths between deep breaths so that you do not get dizzy or light-headed. Do this every 1-2 hours when you are awake. If the spirometer includes a goal marker to show the highest number you have reached (best effort), use this as a goal to work toward during each repetition. After each set of 10 deep breaths, cough a few times. This will help to make sure that your lungs are clear. If you have an incision on your chest or abdomen from surgery, place a pillow or a rolled-up towel firmly against the incision when you cough. This can help to reduce pain while taking deep breaths and coughing. General tips When you are able to get out of bed: Walk around often. Continue to take deep breaths and cough in order to clear your lungs. Keep using the  incentive spirometer until your health care provider says it is okay to stop using it. If you have been in the hospital, you may be told to keep using the spirometer at home. Contact a health care provider if: You are having difficulty using the spirometer. You have trouble using the spirometer as often as instructed. Your pain medicine is not giving enough relief for you to use the spirometer as told. You have a fever. Get help right away if: You develop shortness of breath. You develop a cough with bloody mucus from the lungs. You have fluid or blood coming from an incision site after you cough. Summary An incentive spirometer is a tool that can help you learn to take long, deep breaths to keep your lungs clear and active. You may be asked to use a spirometer after a surgery, if you have a lung problem or a history of smoking, or if you have been inactive for a long period of time. Use your incentive spirometer as instructed every 1-2 hours while you are awake. If you have an incision on your chest or abdomen, place a pillow or a rolled-up towel firmly against your incision when you cough. This will help to reduce pain. Get help right away if you have shortness of breath, you cough up bloody mucus, or blood comes from your incision when you cough. This information is not intended to replace advice given to you by your health care provider. Make sure you discuss any questions you have with your health care provider. Document Revised: 04/14/2023 Document Reviewed: 04/14/2023 Elsevier Patient Education  2024 [employer]  Preoperative Educational Videos for Total Hip, Knee and Shoulder Replacements  To better prepare for surgery, please view our videos that explain the physical activity and discharge planning required to have the best surgical recovery at [facility].  [email or web address]  Questions? Call  [phone] or email [email or web address]

## 2023-07-31 NOTE — Progress Notes (Signed)
 [facility] Perioperative Services: Pre-Admission/Anesthesia Testing  Abnormal Lab Notification and Treatment Plan of Care   Date: 07/31/23  Name: Gabriel Moon MRN:   [id number]  Re: Abnormal labs noted during PAT appointment   Notified:  Provider Name Provider Role Notification Mode  Poggi, Autry Legions, MD Orthopedics (Surgeon) Routed and/or faxed via Kerney Pee, MD Primary Care Provider Routed and/or faxed via [facility]   Clinical Information and Notes:  ABNORMAL LAB VALUE(S): Lab Results  Component Value Date   K 3.0 (L) 07/31/2023   Gabriel Moon is scheduled for an elective UNICOMPARTMENTAL KNEE (Left: Knee) on 08/08/2023. In review of his medication reconciliation, it is noted that the patient is taking prescribed diuretic medications (HCTZ 12.5 mg) daily.   Please note, in efforts to promote a safe and effective anesthetic course, per current guidelines/standards set by the [facility] anesthesia team, the minimal acceptable K+ level for the patient to proceed with general anesthesia is 3.0 mmol/L. With that being said, if the patient drops any lower, his elective procedure will need to be postponed until K+ is better optimized. In efforts to prevent case cancellation, and ultimately to promote the safety of this patient undergoing sedation/anesthesia, will make efforts to optimize pre-surgical K+ level allowing the surgical intervention to proceed as planned.    Impression and Plan:  Gabriel Moon found to be HYPOkalemic at 3.0 mmol/L on preoperative labs.   He is on daily thiazide diuretic therapy.  Discussed diuretic therapy as likely etiology in the absence of GI related symptoms (no diarrhea). Patient denies regular use of laxative medications. Reviewed other potential causes, including decreased intake of dietary K+. Reviewed plans for preoperative optimization as follows:   Meds ordered this encounter  Medications   potassium chloride  SA (KLOR-CON   M) 20 MEQ tablet    Sig: Take 2 tablets (40 mEq) today, then take 1 tablet (20 mEq) daily until surgery. Be sure to take dose on day of procedure. Follow up with PCP for repeat labs.    Dispense:  10 tablet    Refill:  0    Please contact patient once Rx is filled and ready. This is for preoperative optimization and needs to be started ASAP.   Encouraged patient to follow up with PCP about 2-3 weeks postoperatively to have labs rechecked to ensure that levels are remaining within normal range. Discussed nutritional intake of K+ rich foods as an adjunctive way to keep his K+ levels normal; list of K+ rich foods provided. Also mentioned ORS, however advised him not to rely solely on these drinks, as they are high in Na+, and he has a HTN diagnosis.   Will send copy of this note to surgeon and PCP to make them aware of K+ level and plans for correction. Discussed that PCP may elect to pursue a change in diuretic therapy to a K+ sparing type medication, or alternatively, they may consider adding a daily K+ supplement if levels remain low on recheck. Order entered to recheck K+ on the day of his surgery to ensure optimization. Wished patient the best of luck with his upcoming surgery and subsequent recovery. He was encouraged to return call to the PAT clinic, or to his surgeon's office, should any questions or concerns arise between now and the time of his surgery.   Encounter Diagnoses  Name Primary?   Pre-operative laboratory examination Yes   Diuretic-induced hypokalemia    Long term current use of diuretic  Renate Caroline, MSN, APRN, FNP-C, CEN [facility]  Perioperative Services Nurse Practitioner Phone: [phone] 07/31/23 5:34 PM  NOTE: This note has been prepared using Dragon dictation software. Despite my best ability to proofread, there is always the potential that unintentional transcriptional errors may still occur from this process.

## 2023-08-08 ENCOUNTER — Encounter
Admission: RE | Disposition: A | Discharge status: Home / Self Care | Payer: Self-pay | Source: Home / Self Care | Attending: Surgery

## 2023-08-08 ENCOUNTER — Ambulatory Visit: Payer: 59 | Admitting: Urgent Care

## 2023-08-08 ENCOUNTER — Ambulatory Visit
Admission: RE | Admit: 2023-08-08 | Discharge: 2023-08-08 | Disposition: A | Discharge status: Home / Self Care | Payer: 59 | Attending: Surgery | Admitting: Surgery

## 2023-08-08 ENCOUNTER — Other Ambulatory Visit: Payer: Self-pay

## 2023-08-08 ENCOUNTER — Encounter: Payer: Self-pay | Admitting: Surgery

## 2023-08-08 ENCOUNTER — Ambulatory Visit: Payer: 59

## 2023-08-08 DIAGNOSIS — R609 Edema, unspecified: Secondary | ICD-10-CM | POA: Diagnosis not present

## 2023-08-08 DIAGNOSIS — Z471 Aftercare following joint replacement surgery: Secondary | ICD-10-CM | POA: Diagnosis not present

## 2023-08-08 DIAGNOSIS — G4733 Obstructive sleep apnea (adult) (pediatric): Secondary | ICD-10-CM | POA: Diagnosis not present

## 2023-08-08 DIAGNOSIS — Z96652 Presence of left artificial knee joint: Secondary | ICD-10-CM | POA: Diagnosis not present

## 2023-08-08 DIAGNOSIS — M170 Bilateral primary osteoarthritis of knee: Secondary | ICD-10-CM | POA: Insufficient documentation

## 2023-08-08 DIAGNOSIS — M1712 Unilateral primary osteoarthritis, left knee: Secondary | ICD-10-CM | POA: Diagnosis not present

## 2023-08-08 DIAGNOSIS — I10 Essential (primary) hypertension: Secondary | ICD-10-CM | POA: Diagnosis not present

## 2023-08-08 DIAGNOSIS — S83232A Complex tear of medial meniscus, current injury, left knee, initial encounter: Secondary | ICD-10-CM | POA: Diagnosis not present

## 2023-08-08 DIAGNOSIS — X58XXXA Exposure to other specified factors, initial encounter: Secondary | ICD-10-CM | POA: Insufficient documentation

## 2023-08-08 DIAGNOSIS — Z01812 Encounter for preprocedural laboratory examination: Secondary | ICD-10-CM

## 2023-08-08 DIAGNOSIS — Z87891 Personal history of nicotine dependence: Secondary | ICD-10-CM | POA: Insufficient documentation

## 2023-08-08 DIAGNOSIS — M179 Osteoarthritis of knee, unspecified: Secondary | ICD-10-CM

## 2023-08-08 DIAGNOSIS — Z7901 Long term (current) use of anticoagulants: Secondary | ICD-10-CM | POA: Diagnosis not present

## 2023-08-08 DIAGNOSIS — T502X5A Adverse effect of carbonic-anhydrase inhibitors, benzothiadiazides and other diuretics, initial encounter: Secondary | ICD-10-CM

## 2023-08-08 DIAGNOSIS — Z79899 Other long term (current) drug therapy: Secondary | ICD-10-CM

## 2023-08-08 HISTORY — PX: PARTIAL KNEE ARTHROPLASTY: SHX2174

## 2023-08-08 LAB — POCT I-STAT, CHEM 8
BUN: 17 mg/dL (ref 6–20)
Calcium, Ion: 1.15 mmol/L (ref 1.15–1.40)
Chloride: 106 mmol/L (ref 98–111)
Creatinine, Ser: 1 mg/dL (ref 0.61–1.24)
Glucose, Bld: 92 mg/dL (ref 70–99)
HCT: 51 % (ref 39.0–52.0)
Hemoglobin: 17.3 g/dL — ABNORMAL HIGH (ref 13.0–17.0)
Potassium: 3.7 mmol/L (ref 3.5–5.1)
Sodium: 139 mmol/L (ref 135–145)
TCO2: 22 mmol/L (ref 22–32)

## 2023-08-08 SURGERY — ARTHROPLASTY, KNEE, UNICOMPARTMENTAL
Anesthesia: General | Site: Knee | Laterality: Left

## 2023-08-08 MED ORDER — EPHEDRINE SULFATE-NACL 50-0.9 MG/10ML-% IV SOSY
PREFILLED_SYRINGE | INTRAVENOUS | Status: DC | PRN
  Administered 2023-08-08: 5 mg via INTRAVENOUS

## 2023-08-08 MED ORDER — PROPOFOL 500 MG/50ML IV EMUL
INTRAVENOUS | Status: DC | PRN
  Administered 2023-08-08: 75 ug/kg/min via INTRAVENOUS
  Administered 2023-08-08: 60 ug/kg/min via INTRAVENOUS
  Administered 2023-08-08: 100 ug/kg/min via INTRAVENOUS

## 2023-08-08 MED ORDER — ONDANSETRON HCL 4 MG PO TABS: 4.0000 mg | ORAL_TABLET | Freq: Four times a day (QID) | ORAL | Status: DC | PRN

## 2023-08-08 MED ORDER — DEXAMETHASONE SODIUM PHOSPHATE 10 MG/ML IJ SOLN
INTRAMUSCULAR | Status: DC | PRN
  Administered 2023-08-08: 10 mg via INTRAVENOUS

## 2023-08-08 MED ORDER — ATORVASTATIN CALCIUM 20 MG PO TABS: 20.0000 mg | ORAL_TABLET | ORAL | Status: DC

## 2023-08-08 MED ORDER — ACETAMINOPHEN 10 MG/ML IV SOLN
INTRAVENOUS | Status: DC | PRN
  Administered 2023-08-08: 1000 mg via INTRAVENOUS

## 2023-08-08 MED ORDER — METOCLOPRAMIDE HCL 10 MG PO TABS: 5.0000 mg | ORAL_TABLET | Freq: Three times a day (TID) | ORAL | Status: DC | PRN

## 2023-08-08 MED ORDER — TRANEXAMIC ACID-NACL 1000-0.7 MG/100ML-% IV SOLN
INTRAVENOUS | Status: AC
Start: 2023-08-08 — End: ?
  Filled 2023-08-08: qty 100

## 2023-08-08 MED ORDER — ACETAMINOPHEN 325 MG PO TABS: 325.0000 mg | ORAL_TABLET | Freq: Four times a day (QID) | ORAL | Status: DC | PRN

## 2023-08-08 MED ORDER — OXYCODONE HCL 5 MG PO TABS
ORAL_TABLET | ORAL | Status: AC
  Filled 2023-08-08: qty 2

## 2023-08-08 MED ORDER — KETOROLAC TROMETHAMINE 30 MG/ML IJ SOLN
INTRAMUSCULAR | Status: DC | PRN
  Administered 2023-08-08: 30 mg via INTRAMUSCULAR

## 2023-08-08 MED ORDER — TRANEXAMIC ACID-NACL 1000-0.7 MG/100ML-% IV SOLN
1000.0000 mg | INTRAVENOUS | Status: AC
  Administered 2023-08-08: 1000 mg via INTRAVENOUS

## 2023-08-08 MED ORDER — CEFAZOLIN SODIUM-DEXTROSE 2-4 GM/100ML-% IV SOLN
2.0000 g | Freq: Four times a day (QID) | INTRAVENOUS | Status: DC
  Administered 2023-08-08: 2 g via INTRAVENOUS

## 2023-08-08 MED ORDER — DEXAMETHASONE SODIUM PHOSPHATE 10 MG/ML IJ SOLN
INTRAMUSCULAR | Status: AC
Start: 2023-08-08 — End: ?
  Filled 2023-08-08: qty 1

## 2023-08-08 MED ORDER — ONDANSETRON HCL 4 MG/2ML IJ SOLN
INTRAMUSCULAR | Status: DC | PRN
  Administered 2023-08-08: 4 mg via INTRAVENOUS

## 2023-08-08 MED ORDER — LACTATED RINGERS IV SOLN: INTRAVENOUS | Status: DC

## 2023-08-08 MED ORDER — OXYCODONE HCL 5 MG PO TABS
5.0000 mg | ORAL_TABLET | ORAL | Status: DC | PRN
  Administered 2023-08-08: 10 mg via ORAL

## 2023-08-08 MED ORDER — TRIAMCINOLONE ACETONIDE 40 MG/ML IJ SUSP
INTRAMUSCULAR | Status: AC
  Filled 2023-08-08: qty 2

## 2023-08-08 MED ORDER — TRIAMCINOLONE ACETONIDE 40 MG/ML IJ SUSP
INTRAMUSCULAR | Status: DC | PRN
  Administered 2023-08-08: 80 mg

## 2023-08-08 MED ORDER — PROPOFOL 10 MG/ML IV BOLUS
INTRAVENOUS | Status: DC | PRN
  Administered 2023-08-08: 40 mg via INTRAVENOUS

## 2023-08-08 MED ORDER — METOCLOPRAMIDE HCL 5 MG/ML IJ SOLN: 5.0000 mg | Freq: Three times a day (TID) | INTRAMUSCULAR | Status: DC | PRN

## 2023-08-08 MED ORDER — OXYCODONE HCL 5 MG PO TABS: 5.0000 mg | ORAL_TABLET | ORAL | 0 refills | Status: DC | PRN

## 2023-08-08 MED ORDER — ORAL CARE MOUTH RINSE: 15.0000 mL | Freq: Once | OROMUCOSAL | Status: AC

## 2023-08-08 MED ORDER — ACETAMINOPHEN 10 MG/ML IV SOLN
INTRAVENOUS | Status: AC
  Filled 2023-08-08: qty 100

## 2023-08-08 MED ORDER — KETOROLAC TROMETHAMINE 30 MG/ML IJ SOLN
INTRAMUSCULAR | Status: AC
Start: 2023-08-08 — End: ?
  Filled 2023-08-08: qty 1

## 2023-08-08 MED ORDER — PHENYLEPHRINE HCL-NACL 20-0.9 MG/250ML-% IV SOLN
INTRAVENOUS | Status: DC | PRN
Start: 2023-08-08 — End: 2023-08-08
  Administered 2023-08-08: 15 ug/min via INTRAVENOUS

## 2023-08-08 MED ORDER — CHLORHEXIDINE GLUCONATE 0.12 % MT SOLN
OROMUCOSAL | Status: AC
  Filled 2023-08-08: qty 15

## 2023-08-08 MED ORDER — FENTANYL CITRATE (PF) 100 MCG/2ML IJ SOLN
INTRAMUSCULAR | Status: AC
  Filled 2023-08-08: qty 2

## 2023-08-08 MED ORDER — LIDOCAINE HCL (PF) 2 % IJ SOLN
INTRAMUSCULAR | Status: AC
  Filled 2023-08-08: qty 5

## 2023-08-08 MED ORDER — GLYCOPYRROLATE 0.2 MG/ML IJ SOLN
INTRAMUSCULAR | Status: DC | PRN
  Administered 2023-08-08: .1 mg via INTRAVENOUS

## 2023-08-08 MED ORDER — SODIUM CHLORIDE 0.9 % IV BOLUS
250.0000 mL | Freq: Once | INTRAVENOUS | Status: AC
  Administered 2023-08-08: 250 mL via INTRAVENOUS

## 2023-08-08 MED ORDER — SODIUM CHLORIDE (PF) 0.9 % IJ SOLN
INTRAMUSCULAR | Status: DC | PRN
  Administered 2023-08-08: 10 mL

## 2023-08-08 MED ORDER — CEFAZOLIN SODIUM-DEXTROSE 2-4 GM/100ML-% IV SOLN
2.0000 g | INTRAVENOUS | Status: AC
  Administered 2023-08-08: 2 g via INTRAVENOUS

## 2023-08-08 MED ORDER — APIXABAN 2.5 MG PO TABS: 2.5000 mg | ORAL_TABLET | Freq: Two times a day (BID) | ORAL | 0 refills | Status: DC

## 2023-08-08 MED ORDER — FENTANYL CITRATE (PF) 100 MCG/2ML IJ SOLN
INTRAMUSCULAR | Status: DC | PRN
  Administered 2023-08-08 (×2): 25 ug via INTRAVENOUS

## 2023-08-08 MED ORDER — 0.9 % SODIUM CHLORIDE (POUR BTL) OPTIME
TOPICAL | Status: DC | PRN
  Administered 2023-08-08: 1000 mL

## 2023-08-08 MED ORDER — ONDANSETRON HCL 4 MG/2ML IJ SOLN
INTRAMUSCULAR | Status: AC
  Filled 2023-08-08: qty 2

## 2023-08-08 MED ORDER — CHLORHEXIDINE GLUCONATE 0.12 % MT SOLN
15.0000 mL | Freq: Once | OROMUCOSAL | Status: AC
  Administered 2023-08-08: 15 mL via OROMUCOSAL

## 2023-08-08 MED ORDER — DEXMEDETOMIDINE HCL IN NACL 80 MCG/20ML IV SOLN
INTRAVENOUS | Status: DC | PRN
Start: 2023-08-08 — End: 2023-08-08
  Administered 2023-08-08: 8 ug via INTRAVENOUS
  Administered 2023-08-08: 4 ug via INTRAVENOUS
  Administered 2023-08-08: 8 ug via INTRAVENOUS

## 2023-08-08 MED ORDER — KETAMINE HCL 50 MG/5ML IJ SOSY
PREFILLED_SYRINGE | INTRAMUSCULAR | Status: DC | PRN
Start: 2023-08-08 — End: 2023-08-08
  Administered 2023-08-08: 10 mg via INTRAVENOUS

## 2023-08-08 MED ORDER — BUPIVACAINE-EPINEPHRINE (PF) 0.5% -1:200000 IJ SOLN
INTRAMUSCULAR | Status: AC
Start: 2023-08-08 — End: ?
  Filled 2023-08-08: qty 30

## 2023-08-08 MED ORDER — LIDOCAINE HCL (CARDIAC) PF 100 MG/5ML IV SOSY
PREFILLED_SYRINGE | INTRAVENOUS | Status: DC | PRN
  Administered 2023-08-08: 100 mg via INTRAVENOUS

## 2023-08-08 MED ORDER — BUPIVACAINE HCL (PF) 0.5 % IJ SOLN
INTRAMUSCULAR | Status: AC
  Filled 2023-08-08: qty 10

## 2023-08-08 MED ORDER — PHENYLEPHRINE HCL-NACL 20-0.9 MG/250ML-% IV SOLN
INTRAVENOUS | Status: AC
  Filled 2023-08-08: qty 250

## 2023-08-08 MED ORDER — BUPIVACAINE-EPINEPHRINE (PF) 0.5% -1:200000 IJ SOLN
INTRAMUSCULAR | Status: DC | PRN
  Administered 2023-08-08: 30 mL via PERINEURAL

## 2023-08-08 MED ORDER — ONDANSETRON HCL 4 MG/2ML IJ SOLN: 4.0000 mg | Freq: Four times a day (QID) | INTRAMUSCULAR | Status: DC | PRN

## 2023-08-08 MED ORDER — MIDAZOLAM HCL 5 MG/5ML IJ SOLN
INTRAMUSCULAR | Status: DC | PRN
  Administered 2023-08-08: 2 mg via INTRAVENOUS

## 2023-08-08 MED ORDER — PROPOFOL 10 MG/ML IV BOLUS
INTRAVENOUS | Status: AC
  Filled 2023-08-08: qty 40

## 2023-08-08 MED ORDER — CEFAZOLIN SODIUM-DEXTROSE 2-4 GM/100ML-% IV SOLN
INTRAVENOUS | Status: AC
  Filled 2023-08-08: qty 100

## 2023-08-08 MED ORDER — DEXTROSE-SODIUM CHLORIDE 5-0.9 % IV SOLN: INTRAVENOUS | Status: DC

## 2023-08-08 MED ORDER — PHENYLEPHRINE 80 MCG/ML (10ML) SYRINGE FOR IV PUSH (FOR BLOOD PRESSURE SUPPORT)
PREFILLED_SYRINGE | INTRAVENOUS | Status: DC | PRN
  Administered 2023-08-08 (×2): 160 ug via INTRAVENOUS
  Administered 2023-08-08: 80 ug via INTRAVENOUS
  Administered 2023-08-08 (×3): 160 ug via INTRAVENOUS

## 2023-08-08 MED ORDER — CEFAZOLIN SODIUM-DEXTROSE 2-4 GM/100ML-% IV SOLN
INTRAVENOUS | Status: AC
Start: 2023-08-08 — End: ?
  Filled 2023-08-08: qty 100

## 2023-08-08 MED ORDER — KETAMINE HCL 50 MG/5ML IJ SOSY
PREFILLED_SYRINGE | INTRAMUSCULAR | Status: AC
  Filled 2023-08-08: qty 5

## 2023-08-08 MED ORDER — AMLODIPINE BESYLATE 10 MG PO TABS: 10.0000 mg | ORAL_TABLET | ORAL | Status: DC

## 2023-08-08 MED ORDER — FLUTICASONE PROPIONATE 50 MCG/ACT NA SUSP: 2.0000 | Freq: Every day | NASAL | Status: DC | PRN

## 2023-08-08 MED ORDER — BUPIVACAINE LIPOSOME 1.3 % IJ SUSP
INTRAMUSCULAR | Status: AC
  Filled 2023-08-08: qty 20

## 2023-08-08 MED ORDER — MIDAZOLAM HCL 2 MG/2ML IJ SOLN
INTRAMUSCULAR | Status: AC
  Filled 2023-08-08: qty 2

## 2023-08-08 MED ORDER — KETOROLAC TROMETHAMINE 30 MG/ML IJ SOLN
30.0000 mg | Freq: Once | INTRAMUSCULAR | Status: AC
Start: 2023-08-08 — End: 2023-08-08
  Administered 2023-08-08: 30 mg via INTRAVENOUS

## 2023-08-08 MED ORDER — BUPIVACAINE LIPOSOME 1.3 % IJ SUSP
INTRAMUSCULAR | Status: DC | PRN
  Administered 2023-08-08: 20 mL

## 2023-08-08 MED ORDER — SODIUM CHLORIDE (PF) 0.9 % IJ SOLN
INTRAMUSCULAR | Status: AC
  Filled 2023-08-08: qty 10

## 2023-08-08 SURGICAL SUPPLY — 55 items
BEARING MENISCAL TIBIAL 6 MD L (Orthopedic Implant) IMPLANT
BNDG ELASTIC 6INX 5YD STR LF (GAUZE/BANDAGES/DRESSINGS) ×1 IMPLANT
CEMENT BONE R 1X40 (Cement) ×1 IMPLANT
CEMENT VACUUM MIXING SYSTEM (MISCELLANEOUS) ×1 IMPLANT
CHLORAPREP W/TINT 26 (MISCELLANEOUS) ×2 IMPLANT
COMPONENT TIBIA MEDL OXFRD LFT (Joint) IMPLANT
COOLER POLAR GLACIER W/PUMP (MISCELLANEOUS) ×1 IMPLANT
COVER MAYO STAND STRL (DRAPES) ×1 IMPLANT
CUFF TRNQT CYL 24X4X16.5-23 (TOURNIQUET CUFF) IMPLANT
CUFF TRNQT CYL 34X4.125X (TOURNIQUET CUFF) IMPLANT
DRAPE C-ARM XRAY 36X54 (DRAPES) IMPLANT
DRAPE U-SHAPE 47X51 STRL (DRAPES) ×2 IMPLANT
DRSG MEPILEX SACRM 8.7X9.8 (GAUZE/BANDAGES/DRESSINGS) ×1 IMPLANT
DRSG OPSITE POSTOP 4X12 (GAUZE/BANDAGES/DRESSINGS) ×1 IMPLANT
DRSG OPSITE POSTOP 4X6 (GAUZE/BANDAGES/DRESSINGS) ×1 IMPLANT
ELECT CAUTERY BLADE 6.4 (BLADE) ×1 IMPLANT
ELECT REM PT RETURN 9FT ADLT (ELECTROSURGICAL) ×1
ELECTRODE REM PT RTRN 9FT ADLT (ELECTROSURGICAL) ×1 IMPLANT
GAUZE SPONGE 4X4 12PLY STRL (GAUZE/BANDAGES/DRESSINGS) ×1 IMPLANT
GAUZE XEROFORM 1X8 LF (GAUZE/BANDAGES/DRESSINGS) ×1 IMPLANT
GLOVE BIO SURGEON STRL SZ7.5 (GLOVE) ×4 IMPLANT
GLOVE BIO SURGEON STRL SZ8 (GLOVE) ×4 IMPLANT
GLOVE BIOGEL PI IND STRL 8 (GLOVE) ×1 IMPLANT
GOWN STRL REUS W/ TWL LRG LVL3 (GOWN DISPOSABLE) ×1 IMPLANT
GOWN STRL REUS W/ TWL XL LVL3 (GOWN DISPOSABLE) ×1 IMPLANT
HOOD PEEL AWAY T7 (MISCELLANEOUS) ×3 IMPLANT
IV NS IRRIG 3000ML ARTHROMATIC (IV SOLUTION) ×1 IMPLANT
KIT TURNOVER KIT A (KITS) ×1 IMPLANT
MANIFOLD NEPTUNE II (INSTRUMENTS) ×1 IMPLANT
MAT ABSORB FLUID 56X50 GRAY (MISCELLANEOUS) ×1 IMPLANT
NDL SAFETY ECLIPSE 18X1.5 (NEEDLE) ×1 IMPLANT
NDL SPNL 20GX3.5 QUINCKE YW (NEEDLE) ×1 IMPLANT
NEEDLE SPNL 20GX3.5 QUINCKE YW (NEEDLE) ×1 IMPLANT
NS IRRIG 1000ML POUR BTL (IV SOLUTION) ×1 IMPLANT
PACK BLADE SAW RECIP 70 3 PT (BLADE) IMPLANT
PACK TOTAL KNEE (MISCELLANEOUS) ×1 IMPLANT
PAD ABD DERMACEA PRESS 5X9 (GAUZE/BANDAGES/DRESSINGS) ×2 IMPLANT
PAD WRAPON POLAR KNEE (MISCELLANEOUS) ×1 IMPLANT
PEG TWIN FEM CEMENTED MED (Knees) IMPLANT
PENCIL SMOKE EVACUATOR (MISCELLANEOUS) ×1 IMPLANT
PIN DRILL HDLS TROCAR 75 4PK (PIN) IMPLANT
PULSAVAC PLUS IRRIG FAN TIP (DISPOSABLE) ×1
STAPLER SKIN PROX 35W (STAPLE) ×1 IMPLANT
STRAP SAFETY 5IN WIDE (MISCELLANEOUS) ×1 IMPLANT
SUCTION TUBE FRAZIER 10FR DISP (SUCTIONS) ×1 IMPLANT
SUT VIC AB 0 CT1 36 (SUTURE) ×1 IMPLANT
SUT VIC AB 2-0 CT1 TAPERPNT 27 (SUTURE) ×4 IMPLANT
SYR 10ML LL (SYRINGE) ×1 IMPLANT
SYR 30ML LL (SYRINGE) ×2 IMPLANT
TAPE TRANSPORE STRL 2 31045 (GAUZE/BANDAGES/DRESSINGS) ×1 IMPLANT
TIBIA MEDIAL OXFORD LEFT (Joint) ×1 IMPLANT
TIP FAN IRRIG PULSAVAC PLUS (DISPOSABLE) ×1 IMPLANT
TRAP FLUID SMOKE EVACUATOR (MISCELLANEOUS) ×1 IMPLANT
WATER STERILE IRR 500ML POUR (IV SOLUTION) ×1 IMPLANT
WRAPON POLAR PAD KNEE (MISCELLANEOUS) ×1

## 2023-08-08 NOTE — Discharge Instructions (Addendum)
Orthopedic discharge instructions: May shower with intact OpSite dressing. Apply ice frequently to knee or use Polar Care. Start Eliquis 1 tablet (2.5 mg) twice daily on Wednesday, 08/09/2023, for 2 weeks, then take aspirin 325 mg twice daily for 4 weeks. Take pain medication as prescribed when needed.  May supplement with ES Tylenol if necessary. May weight-bear as tolerated on right leg - use walker for balance and support. Follow-up in 10-14 days or as scheduled.  POLAR CARE INFORMATION  MassAdvertisement.it  How to use [employer] Polar Care [facility]?  YouTube   ShippingScam.co.uk  OPERATING INSTRUCTIONS  Start the product With dry hands, connect the transformer to the electrical connection located on the top of the cooler. Next, plug the transformer into an appropriate electrical outlet. The unit will automatically start running at this point.  To stop the pump, disconnect electrical power.  Unplug to stop the product when not in use. Unplugging the Polar Care unit turns it off. Always unplug immediately after use. Never leave it plugged in while unattended. Remove pad.    FIRST ADD WATER TO FILL LINE, THEN ICE---Replace ice when existing ice is almost melted  1 Discuss Treatment with your Licensed Health Care Practitioner and Use Only as Prescribed 2 Apply Insulation Barrier & Cold Therapy Pad 3 Check for Moisture 4 Inspect Skin Regularly  Tips and Trouble Shooting Usage Tips 1. Use cubed or chunked ice for optimal performance. 2. It is recommended to drain the Pad between uses. To drain the pad, hold the Pad upright with the hose pointed toward the ground. Depress the black plunger and allow water to drain out. 3. You may disconnect the Pad from the unit without removing the pad from the affected area by depressing the silver tabs on the hose coupling and gently pulling the hoses apart. The Pad and unit will seal itself and will not leak. Note:  Some dripping during release is normal. 4. DO NOT RUN PUMP WITHOUT WATER! The pump in this unit is designed to run with water. Running the unit without water will cause permanent damage to the pump. 5. Unplug unit before removing lid.  TROUBLESHOOTING GUIDE Pump not running, Water not flowing to the pad, Pad is not getting cold 1. Make sure the transformer is plugged into the wall outlet. 2. Confirm that the ice and water are filled to the indicated levels. 3. Make sure there are no kinks in the pad. 4. Gently pull on the blue tube to make sure the tube/pad junction is straight. 5. Remove the pad from the treatment site and ll it while the pad is lying at; then reapply. 6. Confirm that the pad couplings are securely attached to the unit. Listen for the double clicks (Figure 1) to confirm the pad couplings are securely attached.  Leaks    Note: Some condensation on the lines, controller, and pads is unavoidable, especially in warmer climates. 1. If using a Breg Polar Care Cold Therapy unit with a detachable Cold Therapy Pad, and a leak exists (other than condensation on the lines) disconnect the pad couplings. Make sure the silver tabs on the couplings are depressed before reconnecting the pad to the pump hose; then confirm both sides of the coupling are properly clicked in. 2. If the coupling continues to leak or a leak is detected in the pad itself, stop using it and call [employer] Customer Care at [phone].  Cleaning After use, empty and dry the unit with a soft cloth.  Warm water and mild detergent may be used occasionally to clean the pump and tubes.  WARNING: The Polar Care Cube can be cold enough to cause serious injury, including full skin necrosis. Follow these Operating Instructions, and carefully read the Product Insert (see pouch on side of unit) and the Cold Therapy Pad Fitting Instructions (provided with each Cold Therapy Pad) prior to use.

## 2023-08-08 NOTE — Progress Notes (Signed)
 Patient is not able to walk the distance required to go the bathroom, or he/she is unable to safely negotiate stairs required to access the bathroom.  A 3in1 BSC will alleviate this problem

## 2023-08-08 NOTE — H&P (Signed)
History of Present Illness:  Gabriel Moon is a 53 y.o. male who presents today for repeat evaluation of his right knee. The patient is now close to 14 weeks status post a right partial knee replacement for a complex medial meniscus tear with underlying degenerative joint disease. The patient was evaluated in December, in December the patient was noticing some mild serosanguineous drainage coming from the right knee incision site. He was started on the antibiotic as a precaution and was instructed to continue to monitor the drainage coming from the right knee. The patient was evaluated 10 days ago by Dr. Joice Lofts, at this visit he reported that the right knee was doing very well. He was also scheduled to undergo a left partial knee arthroplasty to be performed by Dr. Joice Lofts on 08/08/2023. At today's visit the patient still reports that his right knee is overall doing well. He does report some continued swelling in the right knee but states that he has returned back to most if not all of his routine activities without any significant discomfort. He denies any fevers or chills. He has not noticed any drainage coming from the right knee at this time. He will take occasional Tylenol. He denies any personal history of heart attack, stroke, asthma or COPD. No personal history of blood clots.  Current Outpatient Medications:  acetaminophen (TYLENOL) 500 MG tablet Take 1,000 mg by mouth as needed for Pain  ALPRAZolam (XANAX) 0.5 MG tablet Take 0.5 mg by mouth 3 (three) times daily. 2  amLODIPine (NORVASC) 10 MG tablet Take 10 mg by mouth once daily  apixaban (ELIQUIS) 2.5 mg tablet Take 1 tablet (2.5 mg total) by mouth every 12 (twelve) hours (Patient not taking: Reported on 07/17/2023) 30 tablet 0  atorvastatin (LIPITOR) 20 MG tablet Take by mouth  cetirizine (ZYRTEC) 10 MG tablet Take 10 mg by mouth once daily as needed for Allergies or Rhinitis  fluticasone (FLONASE) 50 mcg/actuation nasal spray Place 2 sprays into  both nostrils once daily 1  hydroCHLOROthiazide (HYDRODIURIL) 12.5 MG tablet Take 12.5 mg by mouth once daily  loratadine (CLARITIN) 10 mg tablet Take 10 mg by mouth once daily as needed  oxyCODONE (ROXICODONE) 5 MG immediate release tablet Take 1 tablet (5 mg total) by mouth every 8 (eight) hours as needed for Pain (Patient not taking: Reported on 07/17/2023) 30 tablet 0  promethazine-dextromethorphan (PROMETHAZINE-DM) 6.25-15 mg/5 mL syrup Take 5 mLs by mouth every 6 (six) hours as needed for Cough  telmisartan (MICARDIS) 80 MG tablet Take 80 mg by mouth once daily  testosterone enanthate (XYOSTED) 100 mg/0.5 mL AtIn Inject 100 mg subcutaneously once a week   Allergies: Escitalopram Unknown  Escitalopram Oxalate Unknown  Venlafaxine Other (Blurry vision, fatigue)   Past Medical History:  Allergic rhinitis 01/13/2007  Overview: Qualifier: Diagnosis of By: Jonny Ruiz MD, Len Blalock Last Assessment & Plan: stable overall by hx and exam, , and pt to continue medical treatment as before For flonase refill  Allergy  Anxiety state 01/13/2007  Overview: Qualifier: Diagnosis of By: Jonny Ruiz MD, Len Blalock Last Assessment & Plan: Formatting of this note may be different from the original. stable overall by history and exam, recent data reviewed with pt, and pt to continue medical treatment as before, to f/u any worsening symptoms or concerns Lab Results Component Value Date WBC 6.9 09/17/2014 HGB 17.1* 09/17/2014 HCT 49.4 09/17/2014  Essential hypertension 01/08/2015  Last Assessment & Plan: Formatting of this note may be different from the original. New onset,  possibly related to recent wt gain but not clear, no s/s osa, for start amlodipine 5 qd, to f/u any worsening symptoms or concerns, f/u BP at home and next visit BP Readings from Last 3 Encounters: 01/08/15 142/90 11/27/14 124/92 09/17/14 134/88 Last Assessment & Plan: Formatting of this note may  Familial multiple lipoprotein-type hyperlipidemia 01/13/2007   Overview: Qualifier: Diagnosis of By: Jonny Ruiz MD, Len Blalock Last Assessment & Plan: Formatting of this note may be different from the original. stable overall by hx and exam, most recent data reviewed with pt, and pt to continue medical treatment as before, consider statin/declines today, for lower chol diet Lab Results Component Value Date CHOL 230* 04/19/2011 HDL 39.40 04/19/2011 LDLDIREC  Low back pain 04/19/2011  Last Assessment & Plan: stable overall by history and exam,and pt to continue medical treatment as before, to f/u any worsening symptoms or concerns  Obesity 01/08/2015  Last Assessment & Plan: D/w pt - goal BMI 25 or less, for less calories, increased activity now that he has essentially healed from recent right foot sugury  Personal history of urinary calculi 04/26/2007  Overview: Qualifier: Diagnosis of By: Jonny Ruiz MD, Len Blalock  Testosterone deficiency   Past Surgical History:   FOOT SURGERY   Family History:  Diabetes type II Mother  Prostate cancer Father  Migraines Father  Heart disease Maternal Grandmother  Alzheimer's disease Maternal Grandfather   Social History:   Socioeconomic History:  Marital status: Divorced  Tobacco Use  Smoking status: Former  Current packs/day: 0.00  Types: Cigarettes  Quit date: 06/20/2002  Years since quitting: 21.0  Smokeless tobacco: Never  Substance and Sexual Activity  Alcohol use: Yes  Alcohol/week: 0.0 standard drinks of alcohol  Drug use: No  Sexual activity: Defer   Review of Systems:  A comprehensive 14 point ROS was performed, reviewed, and the pertinent orthopaedic findings are documented in the HPI.  Physical Exam: BP 136/80  Ht 172.7 cm (5\' 8" )  Wt (!) 114.1 kg (251 lb 9.6 oz)  BMI 38.26 kg/m  General/Constitutional: The patient appears to be well-nourished, well-developed, and in no acute distress. Neuro/Psych: Normal mood and affect, oriented to person, place and time. Eyes: Non-icteric. Pupils are equal, round, and  reactive to light, and exhibit synchronous movement. ENT: Unremarkable. Lymphatic: No palpable adenopathy. Respiratory: Lungs clear to auscultation, Normal chest excursion, No wheezes, and Non-labored breathing Cardiovascular: Regular rate and rhythm. No murmurs. and No edema, swelling or tenderness, except as noted in detailed exam. Integumentary: No impressive skin lesions present, except as noted in detailed exam. Musculoskeletal: Unremarkable, except as noted in detailed exam.  Left knee exam: Skin inspection of the left knee is unremarkable. There is perhaps mild swelling around the knee, no erythema, ecchymosis, abrasions, or other skin abnormalities are identified. He exhibits a trace effusion. The knee is in mild varus alignment. He has moderate tenderness to palpation along the medial side of the knee, but there is no tenderness along the lateral joint line and no peripatellar tenderness. Actively, he lacks 5 degrees of extension, but is able to flex his knee to 120 degrees. His patella tracks well and is without crepitance. The knee is stable to varus and valgus stressing, but does not does exhibit a mild pseudolaxity varus stressing. He is neurovascularly intact of the left lower extremity and foot.  X-rays/MRI/Lab data:  A recent MRI scan of the left knee is available for review and has been reviewed by myself. By report, the study demonstrates evidence  of a complex degenerative tear of the posterior and medial portions of the medial meniscus with peripheral extrusion and degeneration of the body of the meniscus. There also is evidence of "full-thickness cartilage loss of the weightbearing medial femoral-tibial compartment and mild subchondral marrow edema along the medial tibial plateau." This study also demonstrates evidence of mild chondromalacial changes involving the lateral and patellofemoral compartments.  Assessment: 1. Primary osteoarthritis of left knee.  2. Complex tear of  medial meniscus of left knee. 3. Status post right partial knee replacement.    Plan: 1. Treatment options were discussed today with the patient. 2. Skin examination of the right knee demonstrates a well-healed surgical incision. The patient does have excellent range of motion without any mechanical symptoms at today's appointment. 3. The patient may continue to perform activities as tolerated to the right lower extremity. 4. The patient is scheduled for a left unicondylar knee arthroplasty with Dr. Joice Lofts on 08/08/2023. 5. The patient was instructed on the risk and benefits of surgical intervention and wishes to proceed at this time. This document will serve as a surgical history and physical for the patient. 6. The patient will follow-up per send postop protocol. 7. The patient can contact the clinic if he has any questions, new symptoms well or symptoms worsen.  The procedure was discussed with the patient, as were the potential risks (including bleeding, infection, nerve and/or blood vessel injury, persistent or recurrent pain, failure of the hardware, progression of arthritis, need for further surgery, blood clots, strokes, heart attacks and/or arhythmias, pneumonia, etc.) and benefits. The patient states his understanding and wishes to proceed.    H&P reviewed and patient re-examined. No changes.

## 2023-08-08 NOTE — Transfer of Care (Signed)
Immediate Anesthesia Transfer of Care Note  Patient: Gabriel Moon  Procedure(s) Performed: UNICOMPARTMENTAL KNEE (Left: Knee)  Patient Location: PACU  Anesthesia Type:General and Spinal  Level of Consciousness: awake, alert , oriented, and patient cooperative  Airway & Oxygen Therapy: Patient Spontanous Breathing  Post-op Assessment: Report given to RN and Post -op Vital signs reviewed and stable  Post vital signs: Reviewed and stable  Last Vitals:  Vitals Value Taken Time  BP 97/58 08/08/23 1708  Temp 97   Pulse 81 08/08/23 1715  Resp 15 08/08/23 1715  SpO2 91 % 08/08/23 1715  Vitals shown include unfiled device data.  Last Pain:  Vitals:   08/08/23 1209  TempSrc: Temporal  PainSc: 0-No pain         Complications: No notable events documented.

## 2023-08-08 NOTE — Anesthesia Preprocedure Evaluation (Signed)
Anesthesia Evaluation  Patient identified by MRN, date of birth, ID band Patient awake    Reviewed: Allergy & Precautions, NPO status , Patient's Chart, lab work & pertinent test results  Airway Mallampati: IV  TM Distance: >3 FB Neck ROM: full    Dental  (+) Dental Advidsory Given, Chipped   Pulmonary sleep apnea and Continuous Positive Airway Pressure Ventilation , Patient abstained from smoking., former smoker   Pulmonary exam normal        Cardiovascular hypertension, Normal cardiovascular exam     Neuro/Psych  PSYCHIATRIC DISORDERS Anxiety Depression    negative neurological ROS     GI/Hepatic negative GI ROS, Neg liver ROS,,,  Endo/Other  negative endocrine ROS    Renal/GU      Musculoskeletal   Abdominal  (+) + obese  Peds  Hematology negative hematology ROS (+)   Anesthesia Other Findings Past Medical History: 01/13/2007: ANXIETY No date: Arthritis     Comment:  OSTEO, MIDFOOT No date: Central serous retinopathy, bilateral 04/19/2011: Chronic LBP No date: Headache No date: History of kidney stones 01/13/2007: HYPERLIPIDEMIA No date: Hypertension, essential 04/26/2007: NEPHROLITHIASIS, HX OF No date: Obstructive sleep apnea on CPAP No date: Pneumonia No date: Primary osteoarthritis of right knee No date: Rhinitis, allergic No date: Testosterone deficiency     Comment:  MALE HYPOGONADISM No date: Vitamin D deficiency  Past Surgical History: 11/27/2014: BONE EXOSTOSIS EXCISION; Right     Comment:  Procedure: PARTIAL EXCISION OF TALUS;  Surgeon: Recardo Evangelist, DPM;  Location: [facility];  Service:               Podiatry;  Laterality: Right;  BMI    Body Mass Index: 36.49 kg/m      Reproductive/Obstetrics negative OB ROS                              Anesthesia Physical Anesthesia Plan  ASA: 2  Anesthesia Plan: Spinal and General   Post-op  Pain Management:    Induction:   PONV Risk Score and Plan: 2 and Ondansetron, Dexamethasone, Midazolam, Propofol infusion and TIVA  Airway Management Planned: Natural Airway and Nasal Cannula  Additional Equipment:   Intra-op Plan:   Post-operative Plan:   Informed Consent:      Dental Advisory Given  Plan Discussed with: Anesthesiologist, CRNA and Surgeon  Anesthesia Plan Comments: (Patient reports no bleeding problems and no anticoagulant use.  Plan for spinal with backup GA  Patient consented for risks of anesthesia including but not limited to:  - adverse reactions to medications - damage to eyes, teeth, lips or other oral mucosa - nerve damage due to positioning  - risk of bleeding, infection and or nerve damage from spinal that could lead to paralysis - risk of headache or failed spinal - damage to teeth, lips or other oral mucosa - sore throat or hoarseness - damage to heart, brain, nerves, lungs, other parts of body or loss of life  Patient voiced understanding and assent.)        Anesthesia Quick Evaluation

## 2023-08-08 NOTE — Op Note (Signed)
08/08/2023  5:09 PM  Patient:   Gabriel Moon  Pre-Op Diagnosis:   Osteoarthritis of medial compartment with complex medial meniscus tear, left knee.  Post-Op Diagnosis:   Same  Procedure:   Left unicondylar knee arthroplasty.  Surgeon:   Maryagnes Amos, MD  Assistant:   Wyline Copas, RN  Anesthesia:   Spinal  Findings:   As above.  Complications:   None  EBL:   5 cc  Fluids:   400 cc crystalloid  UOP:   None  TT:   115 minutes at 300 mmHg  Drains:   None  Closure:   Staples  Implants:   All-cemented Biomet Oxford system with a medium femoral component, a "C" sized tibial tray, and a 6 mm meniscal bearing insert.  Brief Clinical Note:   The patient is a 53 year old male with a long history of medial sided left knee pain.  His symptoms have progressed despite medications, activity modification, etc.  His history and examination consistent with advanced degenerative joint disease of localized medial compartment with a complex medial meniscus tear, all of which were confirmed by preoperative MRI scanning. The patient presents at this time for a left partial knee replacement.  Procedure:   The patient was brought into the operating room and a spinal placed by the anesthesiologist. The patient was lain in the supine position, then repositioned so that the non-surgical leg was placed in a flexed and abducted position in the yellow fin leg holder while the surgical extremity was placed over the Biomet leg holder. The left lower extremity was prepped with ChloraPrep solution before being draped sterilely. Preoperative antibiotics were administered. After performing a timeout to verify the appropriate surgical site, the limb was exsanguinated with an Esmarch and the tourniquet inflated to 300 mmHg.   A standard anterior approach to the knee was made through an approximately 3.5-4 inch incision. The incision was carried down through the subcutaneous tissues to expose the superficial  retinaculum. This was split the length the incision and the medial flap elevated sufficiently to expose the medial retinaculum. The medial retinaculum was incised along the medial border of the patella tendon and extended proximally along the medial border of the patella, leaving a 3-4 mm cuff of tissue. The soft tissues were elevated off the anteromedial aspect of the proximal tibia. The anterior portion of the meniscus was removed after performing a subtotal excision of the infrapatellar fat pad. The anterior cruciate ligament was inspected and found to be in excellent condition. Osteophytes were removed from the inferior pole of the patella as well as from the notch using a quarter-inch osteotome. There were significant degenerative changes of both the femur and tibia on the medial side. The medial femoral condyle was sized using the large and medium sizers. It was felt that the medium guide best optimized the contour of the femur.   This was left in place and the external tibial guide positioned. The coupling device was used to connect the guide to the medial femoral condylar sizer to optimize appropriate orientation. Two guide pins were inserted into the cutting block before the coupling device and sizer were removed. The appropriate tibial cut was made using the oscillating and reciprocating saws. The piece was removed in its entirety and taken to the back table where it was sized and found to be optimally replicated by a "C" sized component. The 9 mm spacer was inserted to verify that sufficient bone had been removed.  Attention was directed  to femoral side. The intramedullary canal was accessed through a 4 mm drill hole. The intramedullary guide was positioned before the guide for the femoral condylar holes was positioned. The appropriate coupling device connected this guide to the intramedullary guide before both drill holes were created in the distal aspect of the medial femoral condyle. The devices  were removed and the posterior condylar cutting block inserted. The appropriate cut was made using the reciprocating saw and this piece removed. The #0 spigot was inserted and the initial bone milling performed. A trial femoral component was inserted and both the flexion and extension gaps measured. In flexion, the gap measured 8 mm whereas in extension, it measured 4 mm. Therefore, the # 4 spigot was selected and the secondary bone milling performed. Repeat sizing demonstrated symmetric flexion and extension gaps. The bone was removed from the postero-medial and postero-lateral aspects of the femoral condyle, as well as from the beneath the collar of the spigot. Bone also was removed from the anterior portion of the femur so as to minimize any potential impingement with the meniscal bearing insert. The trial components removed and several drill holes placed into the distal femoral condyle to further augment cement fixation.  Attention was redirected to the tibial side. The "C" sized tibial tray was positioned and temporarily secured using the appropriate spiked nail. The keel was created using the bi-bladed reciprocating saw and hoe. The keeled "C" sized trial tibial tray was inserted to be sure that it seated properly. At this point, a total of 20 cc of Exparel, 2 cc of Kenalog 40 (80 mg), 30 mg of Toradol, and 30 cc of 0.5% Sensorcaine diluted out to 60 cc with normal saline was injected in and around the posterior and medial capsular tissues, as well as the peri-incisional tissues to help with postoperative pain control.  The bony surfaces were prepared for cementing by irrigating them thoroughly with bacitracin saline solution using the jet lavage system before packing them with a dry sponge. Meanwhile, cement was being mixed on the back table. When the cement was ready, the tibial tray was cemented in first. The excess cement was removed using a Public house manager after impacting it into place. Next, the  femoral component was impacted into place. Again the excess cement was removed using a Public house manager. The 6 mm spacer was inserted and the knee brought into near full extension while the cement hardened. Once the cement hardened, the spacer was removed and the 6 mm meniscal bearing insert was trialed. This demonstrated excellent tracking while the knee was placed through a range of motion, and showed no evidence towards subluxation or dislocation. In addition, it did not fit too tightly. Therefore, the permanent 6 mm meniscal bearing insert was snapped into position after verifying that no cement had been retained posteriorly. Again the knee was placed through a range of motion with the findings as described above.  The wound was copiously irrigated with sterile saline solution via the jet lavage system before the retinacular layer was reapproximated using #0 Vicryl interrupted sutures. The subcutaneous tissues were closed in two layers using 2-0 Vicryl interrupted sutures before the skin was closed using staples. A sterile occlusive dressing was applied to the knee before the patient was awakened. The patient was transferred back to his hospital bed and returned to the recovery room in satisfactory condition after tolerating the procedure well. A Polar Care device was applied to the knee as well.

## 2023-08-09 DIAGNOSIS — Z471 Aftercare following joint replacement surgery: Secondary | ICD-10-CM | POA: Diagnosis not present

## 2023-08-10 DIAGNOSIS — Z96652 Presence of left artificial knee joint: Secondary | ICD-10-CM | POA: Insufficient documentation

## 2023-08-10 NOTE — Anesthesia Postprocedure Evaluation (Signed)
Anesthesia Post Note  Patient: Gabriel Moon  Procedure(s) Performed: UNICOMPARTMENTAL KNEE (Left: Knee)  Patient location during evaluation: Nursing Unit Anesthesia Type: Spinal Level of consciousness: awake and alert Pain management: pain level controlled Vital Signs Assessment: post-procedure vital signs reviewed and stable Respiratory status: spontaneous breathing, nonlabored ventilation, respiratory function stable and patient connected to nasal cannula oxygen Cardiovascular status: blood pressure returned to baseline and stable Postop Assessment: no apparent nausea or vomiting Anesthetic complications: no   No notable events documented.   Last Vitals:  Vitals:   08/08/23 1804 08/08/23 1939  BP: 108/73 119/85  Pulse: 83 82  Resp: 16 16  Temp: 36.6 C   SpO2: 95% 94%    Last Pain:  Vitals:   08/08/23 1804  TempSrc: Temporal  PainSc: 6                  Stephanie Coup

## 2023-08-11 ENCOUNTER — Encounter: Payer: Self-pay | Admitting: Surgery

## 2023-08-11 DIAGNOSIS — Z471 Aftercare following joint replacement surgery: Secondary | ICD-10-CM | POA: Diagnosis not present

## 2023-08-12 DIAGNOSIS — Z471 Aftercare following joint replacement surgery: Secondary | ICD-10-CM | POA: Diagnosis not present

## 2023-08-14 DIAGNOSIS — E669 Obesity, unspecified: Secondary | ICD-10-CM | POA: Diagnosis not present

## 2023-08-14 DIAGNOSIS — Z87891 Personal history of nicotine dependence: Secondary | ICD-10-CM | POA: Diagnosis not present

## 2023-08-14 DIAGNOSIS — J309 Allergic rhinitis, unspecified: Secondary | ICD-10-CM | POA: Diagnosis not present

## 2023-08-14 DIAGNOSIS — Z87442 Personal history of urinary calculi: Secondary | ICD-10-CM | POA: Diagnosis not present

## 2023-08-14 DIAGNOSIS — Z79899 Other long term (current) drug therapy: Secondary | ICD-10-CM | POA: Diagnosis not present

## 2023-08-14 DIAGNOSIS — F411 Generalized anxiety disorder: Secondary | ICD-10-CM | POA: Diagnosis not present

## 2023-08-14 DIAGNOSIS — Z7901 Long term (current) use of anticoagulants: Secondary | ICD-10-CM | POA: Diagnosis not present

## 2023-08-14 DIAGNOSIS — E7849 Other hyperlipidemia: Secondary | ICD-10-CM | POA: Diagnosis not present

## 2023-08-14 DIAGNOSIS — Z96653 Presence of artificial knee joint, bilateral: Secondary | ICD-10-CM | POA: Diagnosis not present

## 2023-08-14 DIAGNOSIS — Z6836 Body mass index (BMI) 36.0-36.9, adult: Secondary | ICD-10-CM | POA: Diagnosis not present

## 2023-08-14 DIAGNOSIS — Z471 Aftercare following joint replacement surgery: Secondary | ICD-10-CM | POA: Diagnosis not present

## 2023-08-14 DIAGNOSIS — I10 Essential (primary) hypertension: Secondary | ICD-10-CM | POA: Diagnosis not present

## 2023-08-16 DIAGNOSIS — Z87891 Personal history of nicotine dependence: Secondary | ICD-10-CM | POA: Diagnosis not present

## 2023-08-16 DIAGNOSIS — Z7901 Long term (current) use of anticoagulants: Secondary | ICD-10-CM | POA: Diagnosis not present

## 2023-08-16 DIAGNOSIS — Z96653 Presence of artificial knee joint, bilateral: Secondary | ICD-10-CM | POA: Diagnosis not present

## 2023-08-16 DIAGNOSIS — I10 Essential (primary) hypertension: Secondary | ICD-10-CM | POA: Diagnosis not present

## 2023-08-16 DIAGNOSIS — Z79899 Other long term (current) drug therapy: Secondary | ICD-10-CM | POA: Diagnosis not present

## 2023-08-16 DIAGNOSIS — E7849 Other hyperlipidemia: Secondary | ICD-10-CM | POA: Diagnosis not present

## 2023-08-16 DIAGNOSIS — Z87442 Personal history of urinary calculi: Secondary | ICD-10-CM | POA: Diagnosis not present

## 2023-08-16 DIAGNOSIS — J309 Allergic rhinitis, unspecified: Secondary | ICD-10-CM | POA: Diagnosis not present

## 2023-08-16 DIAGNOSIS — Z471 Aftercare following joint replacement surgery: Secondary | ICD-10-CM | POA: Diagnosis not present

## 2023-08-16 DIAGNOSIS — E669 Obesity, unspecified: Secondary | ICD-10-CM | POA: Diagnosis not present

## 2023-08-16 DIAGNOSIS — Z6836 Body mass index (BMI) 36.0-36.9, adult: Secondary | ICD-10-CM | POA: Diagnosis not present

## 2023-08-16 DIAGNOSIS — F411 Generalized anxiety disorder: Secondary | ICD-10-CM | POA: Diagnosis not present

## 2023-08-17 DIAGNOSIS — Z471 Aftercare following joint replacement surgery: Secondary | ICD-10-CM | POA: Diagnosis not present

## 2023-08-18 DIAGNOSIS — Z7901 Long term (current) use of anticoagulants: Secondary | ICD-10-CM | POA: Diagnosis not present

## 2023-08-18 DIAGNOSIS — Z79899 Other long term (current) drug therapy: Secondary | ICD-10-CM | POA: Diagnosis not present

## 2023-08-18 DIAGNOSIS — Z6836 Body mass index (BMI) 36.0-36.9, adult: Secondary | ICD-10-CM | POA: Diagnosis not present

## 2023-08-18 DIAGNOSIS — Z87442 Personal history of urinary calculi: Secondary | ICD-10-CM | POA: Diagnosis not present

## 2023-08-18 DIAGNOSIS — Z87891 Personal history of nicotine dependence: Secondary | ICD-10-CM | POA: Diagnosis not present

## 2023-08-18 DIAGNOSIS — E7849 Other hyperlipidemia: Secondary | ICD-10-CM | POA: Diagnosis not present

## 2023-08-18 DIAGNOSIS — Z471 Aftercare following joint replacement surgery: Secondary | ICD-10-CM | POA: Diagnosis not present

## 2023-08-18 DIAGNOSIS — J309 Allergic rhinitis, unspecified: Secondary | ICD-10-CM | POA: Diagnosis not present

## 2023-08-18 DIAGNOSIS — E669 Obesity, unspecified: Secondary | ICD-10-CM | POA: Diagnosis not present

## 2023-08-18 DIAGNOSIS — I10 Essential (primary) hypertension: Secondary | ICD-10-CM | POA: Diagnosis not present

## 2023-08-18 DIAGNOSIS — F411 Generalized anxiety disorder: Secondary | ICD-10-CM | POA: Diagnosis not present

## 2023-08-18 DIAGNOSIS — Z96653 Presence of artificial knee joint, bilateral: Secondary | ICD-10-CM | POA: Diagnosis not present

## 2023-08-21 DIAGNOSIS — F411 Generalized anxiety disorder: Secondary | ICD-10-CM | POA: Diagnosis not present

## 2023-08-21 DIAGNOSIS — I10 Essential (primary) hypertension: Secondary | ICD-10-CM | POA: Diagnosis not present

## 2023-08-21 DIAGNOSIS — E7849 Other hyperlipidemia: Secondary | ICD-10-CM | POA: Diagnosis not present

## 2023-08-21 DIAGNOSIS — Z6836 Body mass index (BMI) 36.0-36.9, adult: Secondary | ICD-10-CM | POA: Diagnosis not present

## 2023-08-21 DIAGNOSIS — Z471 Aftercare following joint replacement surgery: Secondary | ICD-10-CM | POA: Diagnosis not present

## 2023-08-21 DIAGNOSIS — E669 Obesity, unspecified: Secondary | ICD-10-CM | POA: Diagnosis not present

## 2023-08-21 DIAGNOSIS — J309 Allergic rhinitis, unspecified: Secondary | ICD-10-CM | POA: Diagnosis not present

## 2023-08-21 DIAGNOSIS — Z7901 Long term (current) use of anticoagulants: Secondary | ICD-10-CM | POA: Diagnosis not present

## 2023-08-21 DIAGNOSIS — Z96653 Presence of artificial knee joint, bilateral: Secondary | ICD-10-CM | POA: Diagnosis not present

## 2023-08-21 DIAGNOSIS — Z87442 Personal history of urinary calculi: Secondary | ICD-10-CM | POA: Diagnosis not present

## 2023-08-21 DIAGNOSIS — Z79899 Other long term (current) drug therapy: Secondary | ICD-10-CM | POA: Diagnosis not present

## 2023-08-21 DIAGNOSIS — Z87891 Personal history of nicotine dependence: Secondary | ICD-10-CM | POA: Diagnosis not present

## 2023-08-23 ENCOUNTER — Other Ambulatory Visit: Payer: Self-pay | Admitting: Internal Medicine

## 2023-08-24 ENCOUNTER — Other Ambulatory Visit: Payer: Self-pay

## 2023-09-22 DIAGNOSIS — Z96652 Presence of left artificial knee joint: Secondary | ICD-10-CM | POA: Diagnosis not present

## 2023-09-25 ENCOUNTER — Other Ambulatory Visit: Payer: Self-pay | Admitting: Internal Medicine

## 2023-09-25 ENCOUNTER — Other Ambulatory Visit: Payer: Self-pay

## 2023-10-07 DIAGNOSIS — R051 Acute cough: Secondary | ICD-10-CM | POA: Diagnosis not present

## 2023-10-07 DIAGNOSIS — J069 Acute upper respiratory infection, unspecified: Secondary | ICD-10-CM | POA: Diagnosis not present

## 2023-10-09 DIAGNOSIS — R051 Acute cough: Secondary | ICD-10-CM | POA: Diagnosis not present

## 2023-10-09 DIAGNOSIS — J40 Bronchitis, not specified as acute or chronic: Secondary | ICD-10-CM | POA: Diagnosis not present

## 2023-10-11 ENCOUNTER — Other Ambulatory Visit: Payer: Self-pay

## 2023-10-11 DIAGNOSIS — E291 Testicular hypofunction: Secondary | ICD-10-CM

## 2023-10-12 ENCOUNTER — Other Ambulatory Visit: Payer: Self-pay

## 2023-10-13 ENCOUNTER — Other Ambulatory Visit

## 2023-10-13 DIAGNOSIS — E291 Testicular hypofunction: Secondary | ICD-10-CM | POA: Diagnosis not present

## 2023-10-14 LAB — TESTOSTERONE: Testosterone: 600 ng/dL (ref 264–916)

## 2023-10-14 LAB — PSA: Prostate Specific Ag, Serum: 0.7 ng/mL (ref 0.0–4.0)

## 2023-10-14 LAB — HEMATOCRIT: Hematocrit: 52.3 % — ABNORMAL HIGH (ref 37.5–51.0)

## 2023-10-17 ENCOUNTER — Other Ambulatory Visit: Payer: Self-pay | Admitting: *Deleted

## 2023-10-17 DIAGNOSIS — N200 Calculus of kidney: Secondary | ICD-10-CM

## 2023-10-18 ENCOUNTER — Ambulatory Visit: Payer: Self-pay | Admitting: Urology

## 2023-10-18 ENCOUNTER — Ambulatory Visit
Admission: RE | Admit: 2023-10-18 | Discharge: 2023-10-18 | Disposition: A | Discharge status: Home / Self Care | Source: Ambulatory Visit | Attending: Urology | Admitting: Urology

## 2023-10-18 VITALS — BP 117/79 | HR 75 | Ht 68.0 in | Wt 245.0 lb

## 2023-10-18 DIAGNOSIS — E291 Testicular hypofunction: Secondary | ICD-10-CM | POA: Diagnosis not present

## 2023-10-18 DIAGNOSIS — N201 Calculus of ureter: Secondary | ICD-10-CM | POA: Diagnosis not present

## 2023-10-18 DIAGNOSIS — N200 Calculus of kidney: Secondary | ICD-10-CM | POA: Insufficient documentation

## 2023-10-18 DIAGNOSIS — D751 Secondary polycythemia: Secondary | ICD-10-CM

## 2023-10-18 DIAGNOSIS — I878 Other specified disorders of veins: Secondary | ICD-10-CM | POA: Diagnosis not present

## 2023-10-18 DIAGNOSIS — Z125 Encounter for screening for malignant neoplasm of prostate: Secondary | ICD-10-CM

## 2023-10-18 NOTE — Progress Notes (Signed)
 I, Maysun Jamey Mccallum, acting as a scribe for Geraline Knapp, MD., have documented all relevant documentation on the behalf of Geraline Knapp, MD, as directed by Geraline Knapp, MD while in the presence of Geraline Knapp, MD.  10/18/2023 3:43 PM   Gabriel Moon 04-May-1971 [id number]  Referring provider: Roslyn Coombe, MD [address] [place],  [place] [address]  Chief Complaint  Patient presents with   Nephrolithiasis   Urologic history: 1.  Hypogonadism -Symptoms tiredness, fatigue -Xyosted  100 mg weekly -Erythrocytosis followed by hematology  2. Recurrent nephrolithiasis - Metabolic evaluation remarkable for low urine volume, hypocytracheria, and mild hyperoxaluria.  HPI: Gabriel Moon is a 53 y.o. male presents for annual follow-up.  Saw Arch Ko in late January 2025, with right flank pain. CT was felt to show a 3 mm distal calculus, however on our review, the calculus was in the bladder. He states he has passed several small stones since his last visit. Currently asymptomatic.  He was diagnosed with central serous retinopathy and his ophthalmologist had recommended a treatment discontinuing testosterone  as this could be a potential exacerbating factor. He had had significant decrease in his quality of life when he has not been on testosterone  and elected not to discontinue TRT. Labs 10/13/23 PSA stable 0.7, testosterone  600, hematocrit 52.3.  He donates blood every 3 months and was scheduled to donate last week, however was unable to do so secondary to sinusitis.  PSA trend   Prostate Specific Ag, Serum  Latest Ref Rng 0.0 - 4.0 ng/mL  03/29/2019 0.6   09/25/2019 0.7   04/06/2020 0.7   10/05/2020 0.8   04/02/2021 0.9   10/05/2021 0.8   10/07/2022 0.9   10/13/2023 0.7      PMH: Past Medical History:  Diagnosis Date   ANXIETY 01/13/2007   Arthritis    OSTEO, MIDFOOT   Central serous retinopathy, bilateral    Chronic LBP 04/19/2011   Headache     History of kidney stones    HYPERLIPIDEMIA 01/13/2007   Hypertension, essential    NEPHROLITHIASIS, HX OF 04/26/2007   Obstructive sleep apnea on CPAP    Pneumonia    Primary osteoarthritis of right knee    Rhinitis, allergic    Testosterone  deficiency    a.) on TRT using Xyosted    Vitamin D  deficiency     Surgical History: Past Surgical History:  Procedure Laterality Date   BONE EXOSTOSIS EXCISION Right 11/27/2014   Procedure: PARTIAL EXCISION OF TALUS;  Surgeon: Sharlyn Deaner, DPM;  [facility]: [facility];  Service: Podiatry;  Laterality: Right;   PARTIAL KNEE ARTHROPLASTY Right 04/13/2023   Procedure: UNICOMPARTMENTAL KNEE;  Surgeon: Elner Hahn, MD;  Location: [facility];  Service: Orthopedics;  Laterality: Right;   PARTIAL KNEE ARTHROPLASTY Left 08/08/2023   Procedure: UNICOMPARTMENTAL KNEE;  Surgeon: Elner Hahn, MD;  Location: [facility];  Service: Orthopedics;  Laterality: Left;    Home Medications:  Allergies as of 10/18/2023       Reactions   Effexor  [venlafaxine ] Other (See Comments)   Blurry vision, fatgue   Lexapro  [escitalopram  Oxalate]    BAD REACTION-SUICIDAL        Medication List        Accurate as of October 18, 2023  3:43 PM. If you have any questions, ask your nurse or doctor.          STOP taking these medications    apixaban  2.5 MG Tabs  tablet Commonly known as: Eliquis    MILK THISTLE PO   ondansetron  4 MG disintegrating tablet Commonly known as: ZOFRAN -ODT   oxyCODONE  5 MG immediate release tablet Commonly known as: Roxicodone    potassium chloride  SA 20 MEQ tablet Commonly known as: KLOR-CON  M   VITAMIN B-6 PO       TAKE these medications    acetaminophen  500 MG tablet Commonly known as: TYLENOL  Take 1,000 mg by mouth every 6 (six) hours as needed for mild pain (pain score 1-3) or moderate pain (pain score 4-6).   Allergy Eye 0.025-0.3 % ophthalmic solution Generic drug: naphazoline-pheniramine Place 1 drop into  both eyes 4 (four) times daily as needed for allergies.   ALPRAZolam  0.5 MG tablet Commonly known as: XANAX  TAKE 1 TABLET(0.5 MG) BY MOUTH THREE TIMES DAILY AS NEEDED FOR ANXIETY   amLODipine  10 MG tablet Commonly known as: NORVASC  Take 1 tablet (10 mg total) by mouth every morning. TAKE 1 TABLET(10 MG) BY MOUTH DAILY   atorvastatin  20 MG tablet Commonly known as: LIPITOR Take 1 tablet (20 mg total) by mouth every morning. TAKE 1 TABLET(20 MG) BY MOUTH DAILY   fluticasone  50 MCG/ACT nasal spray Commonly known as: FLONASE  SHAKE LIQUID AND USE 2 SPRAYS IN EACH NOSTRIL EVERY DAY   hydrochlorothiazide  12.5 MG capsule Commonly known as: MICROZIDE  Take 1 capsule (12.5 mg total) by mouth daily. TAKE 1 CAPSULE(12.5 MG) BY MOUTH DAILY Must keep scheduled appt for future refills   loratadine  10 MG tablet Commonly known as: CLARITIN  Take 10 mg by mouth daily as needed for allergies.   VITAMIN K2-VITAMIN D3 PO Take 1 tablet by mouth daily.   Xyosted  100 MG/0.5ML Soaj Generic drug: Testosterone  Enanthate INJECT ONE pen SUBCUTANEOUS ONCE EVERY WEEK        Allergies:  Allergies  Allergen Reactions   Effexor  [Venlafaxine ] Other (See Comments)    Blurry vision, fatgue   Lexapro  [Escitalopram  Oxalate]     BAD REACTION-SUICIDAL    Family History: Family History  Problem Relation Age of Onset   Diabetes Mother    Prostate cancer Father    Migraines Father     Social History:  reports that he quit smoking about 21 years ago. His smoking use included cigarettes. He started smoking about 36 years ago. He has a 15 pack-year smoking history. He has never used smokeless tobacco. He reports current alcohol use of about 2.0 standard drinks of alcohol per week. He reports that he does not use drugs.   Physical Exam: BP 117/79   Pulse 75   Ht 5\' 8"  (1.727 m)   Wt 245 lb (111.1 kg)   BMI 37.25 kg/m   Constitutional:  Alert and oriented, No acute distress. HEENT: [place] AT Respiratory:  Normal respiratory effort, no increased work of breathing. Psychiatric: Normal mood and affect.   Pertinent Imaging: KUB and CT were personally reviewed and interpreted.   Abdomen 1 view (KUB)  Narrative CLINICAL DATA:  Kidney stone.  Patient reports bilateral pain.  EXAM: ABDOMEN - 1 VIEW  COMPARISON:  CT 07/24/2023  FINDINGS: Some of the known left renal calculi are demonstrated by radiograph. The known right intrarenal stones are not seen on the current exam. No evidence of ureteral stone. The previous left ureterovesicular junction stone on CT is not definitively seen. There are multiple pelvic phleboliths. Moderate colonic stool burden without evidence of obstruction.  IMPRESSION: 1. Some of the known left renal calculi are demonstrated by radiograph. The known right intrarenal  stones are not seen on the current exam. 2. No evidence of ureteral stone. The previous left ureterovesicular junction stone on CT is not definitively seen.   Electronically Signed By: Chadwick Colonel M.D. On: 10/18/2023 11:36   CT RENAL STONE STUDY  Narrative CLINICAL DATA:  Flank pain  EXAM: CT ABDOMEN AND PELVIS WITHOUT CONTRAST  TECHNIQUE: Multidetector CT imaging of the abdomen and pelvis was performed following the standard protocol without IV contrast.  RADIATION DOSE REDUCTION: This exam was performed according to the departmental dose-optimization program which includes automated exposure control, adjustment of the mA and/or kV according to patient size and/or use of iterative reconstruction technique.  COMPARISON:  10/10/2022.  FINDINGS: Lower chest: No acute abnormality. No pleural or pericardial effusion.  Hepatobiliary: Subcentimeter cyst right lobe of the liver. Unremarkable gallbladder.  Pancreas: Unremarkable. No pancreatic ductal dilatation or surrounding inflammatory changes.  Spleen: Normal in size without focal abnormality.  Adrenals/Urinary Tract:  No adrenal lesions. Numerous bilateral intrarenal stones on the left measuring up to 6 mm. A few punctate stones on the right. No hydronephrosis. Stone at the left UVJ measuring 7 mm. No hydronephrosis.  Stomach/Bowel: Stomach is within normal limits. Appendix appears normal. No evidence of bowel wall thickening, distention, or inflammatory changes. Diverticulosis of the descending and sigmoid colon.  Vascular/Lymphatic: Aortic atherosclerosis. No enlarged abdominal or pelvic lymph nodes.  Reproductive: Prostate is unremarkable.  Other: No abdominal wall hernia or abnormality. No abdominopelvic ascites.  Musculoskeletal: Thoracolumbosacral degenerative changes.  IMPRESSION: 1. Bilateral nephrolithiasis. 7 mm stone at the left UVJ without hydronephrosis. 2. Diverticulosis. 3. Aortic atherosclerosis (ICD10-I70.0).   Electronically Signed By: Sydell Eva M.D. On: 08/05/2023 17:09   Assessment & Plan:    1. Hypogonadism Stable labs Lab visit 6 months testosterone , H/H He states his central serous retinopathy is stable and does not desire to stop TRT.  2. Erythrocytosis He is rescheduling his blood donation.   3. Bilateral nephrolithiasis KUB today shows stable calcifications overlying the early left renal outline.  I have reviewed the above documentation for accuracy and completeness, and I agree with the above.   Geraline Knapp, MD  [facility] [address], [address] [place], [place] [address] [phone]

## 2023-10-20 ENCOUNTER — Encounter: Payer: Self-pay | Admitting: Urology

## 2023-11-01 ENCOUNTER — Encounter: Payer: Self-pay | Admitting: Urology

## 2023-11-06 ENCOUNTER — Encounter (INDEPENDENT_AMBULATORY_CARE_PROVIDER_SITE_OTHER): Payer: 59 | Admitting: Ophthalmology

## 2023-11-14 ENCOUNTER — Encounter: Payer: Self-pay | Admitting: Internal Medicine

## 2023-11-14 ENCOUNTER — Ambulatory Visit: Payer: Self-pay | Admitting: Internal Medicine

## 2023-11-14 ENCOUNTER — Ambulatory Visit (INDEPENDENT_AMBULATORY_CARE_PROVIDER_SITE_OTHER): Payer: 59 | Admitting: Internal Medicine

## 2023-11-14 VITALS — BP 138/82 | HR 70 | Temp 98.4°F | Ht 68.0 in | Wt 257.0 lb

## 2023-11-14 DIAGNOSIS — E538 Deficiency of other specified B group vitamins: Secondary | ICD-10-CM

## 2023-11-14 DIAGNOSIS — Z0000 Encounter for general adult medical examination without abnormal findings: Secondary | ICD-10-CM

## 2023-11-14 DIAGNOSIS — Z125 Encounter for screening for malignant neoplasm of prostate: Secondary | ICD-10-CM

## 2023-11-14 DIAGNOSIS — Z0001 Encounter for general adult medical examination with abnormal findings: Secondary | ICD-10-CM

## 2023-11-14 DIAGNOSIS — R739 Hyperglycemia, unspecified: Secondary | ICD-10-CM

## 2023-11-14 DIAGNOSIS — Z1211 Encounter for screening for malignant neoplasm of colon: Secondary | ICD-10-CM

## 2023-11-14 DIAGNOSIS — I10 Essential (primary) hypertension: Secondary | ICD-10-CM | POA: Diagnosis not present

## 2023-11-14 DIAGNOSIS — E559 Vitamin D deficiency, unspecified: Secondary | ICD-10-CM

## 2023-11-14 DIAGNOSIS — G4733 Obstructive sleep apnea (adult) (pediatric): Secondary | ICD-10-CM | POA: Diagnosis not present

## 2023-11-14 DIAGNOSIS — E7800 Pure hypercholesterolemia, unspecified: Secondary | ICD-10-CM | POA: Diagnosis not present

## 2023-11-14 LAB — BASIC METABOLIC PANEL WITH GFR
BUN: 19 mg/dL (ref 6–23)
CO2: 25 meq/L (ref 19–32)
Calcium: 9.1 mg/dL (ref 8.4–10.5)
Chloride: 104 meq/L (ref 96–112)
Creatinine, Ser: 0.95 mg/dL (ref 0.40–1.50)
GFR: 92.4 mL/min (ref >60.00)
Glucose, Bld: 95 mg/dL (ref 70–99)
Potassium: 3.6 meq/L (ref 3.5–5.1)
Sodium: 139 meq/L (ref 135–145)

## 2023-11-14 LAB — LIPID PANEL
Cholesterol: 137 mg/dL (ref 0–200)
HDL: 43.6 mg/dL (ref >39.00)
LDL Cholesterol: 63 mg/dL (ref 0–99)
NonHDL: 93.04
Total CHOL/HDL Ratio: 3
Triglycerides: 150 mg/dL — ABNORMAL HIGH (ref 0.0–149.0)
VLDL: 30 mg/dL (ref 0.0–40.0)

## 2023-11-14 LAB — CBC WITH DIFFERENTIAL/PLATELET
Basophils Absolute: 0.1 10*3/uL (ref 0.0–0.1)
Basophils Relative: 1 % (ref 0.0–3.0)
Eosinophils Absolute: 0.3 10*3/uL (ref 0.0–0.7)
Eosinophils Relative: 3.1 % (ref 0.0–5.0)
HCT: 48 % (ref 39.0–52.0)
Hemoglobin: 16.4 g/dL (ref 13.0–17.0)
Lymphocytes Relative: 24.1 % (ref 12.0–46.0)
Lymphs Abs: 1.9 10*3/uL (ref 0.7–4.0)
MCHC: 34.2 g/dL (ref 30.0–36.0)
MCV: 89.1 fl (ref 78.0–100.0)
Monocytes Absolute: 0.8 10*3/uL (ref 0.1–1.0)
Monocytes Relative: 10.2 % (ref 3.0–12.0)
Neutro Abs: 5 10*3/uL (ref 1.4–7.7)
Neutrophils Relative %: 61.6 % (ref 43.0–77.0)
Platelets: 281 10*3/uL (ref 150.0–400.0)
RBC: 5.39 Mil/uL (ref 4.22–5.81)
RDW: 15.6 % — ABNORMAL HIGH (ref 11.5–15.5)
WBC: 8.1 10*3/uL (ref 4.0–10.5)

## 2023-11-14 LAB — URINALYSIS, ROUTINE W REFLEX MICROSCOPIC
Bilirubin Urine: NEGATIVE
Hgb urine dipstick: NEGATIVE
Ketones, ur: NEGATIVE
Leukocytes,Ua: NEGATIVE
Nitrite: NEGATIVE
Specific Gravity, Urine: 1.02 (ref 1.000–1.030)
Total Protein, Urine: NEGATIVE
Urine Glucose: NEGATIVE
Urobilinogen, UA: 0.2 (ref 0.0–1.0)
WBC, UA: NONE SEEN (ref 0–?)
pH: 6 (ref 5.0–8.0)

## 2023-11-14 LAB — VITAMIN B12: Vitamin B-12: 389 pg/mL (ref 211–911)

## 2023-11-14 LAB — HEPATIC FUNCTION PANEL
ALT: 31 U/L (ref 0–53)
AST: 15 U/L (ref 0–37)
Albumin: 4.3 g/dL (ref 3.5–5.2)
Alkaline Phosphatase: 65 U/L (ref 39–117)
Bilirubin, Direct: 0.1 mg/dL (ref 0.0–0.3)
Total Bilirubin: 0.5 mg/dL (ref 0.2–1.2)
Total Protein: 7.1 g/dL (ref 6.0–8.3)

## 2023-11-14 LAB — HEMOGLOBIN A1C: Hgb A1c MFr Bld: 5.8 % (ref 4.6–6.5)

## 2023-11-14 LAB — TSH: TSH: 3.17 u[IU]/mL (ref 0.35–5.50)

## 2023-11-14 LAB — VITAMIN D 25 HYDROXY (VIT D DEFICIENCY, FRACTURES): VITD: 38.75 ng/mL (ref 30.00–100.00)

## 2023-11-14 NOTE — Assessment & Plan Note (Signed)
 Lab Results  Component Value Date   LDLCALC 88 11/11/2022   Stable, pt to continue current statin lipitor 20 mg qd

## 2023-11-14 NOTE — Progress Notes (Signed)
 Patient ID: Gabriel Moon, male   DOB: 09/03/1970, 53 y.o.   MRN: [id number]         Chief Complaint:: wellness exam and htn, hyperglycemia, low vit d, hld       HPI:  Gabriel Moon is a 53 y.o. male here for wellness exam; tried cologuard twice last yr without good results, willing to try again.; still donating blood every 3 mo to avoid polycythemia with testotserone replacement;  for shingrix at pharmacy,  declines colonoscopy  o/w up to date                        Also s/p right partial TKR oct 2024 then left partial TKR was feb 2025, now post rehab looking to become more active.  Doing well,except soreness.  Overall gained 17 lbs since feb 2025.  Pt denies chest pain, increased sob or doe, wheezing, orthopnea, PND, increased LE swelling, palpitations, dizziness or syncope.   Pt denies polydipsia, polyuria, or new focal neuro s/s.    Pt denies fever, wt loss, night sweats, loss of appetite, or other constitutional symptoms     Wt Readings from Last 3 Encounters:  11/14/23 257 lb (116.6 kg)  10/18/23 245 lb (111.1 kg)  08/08/23 240 lb 1.3 oz (108.9 kg)   BP Readings from Last 3 Encounters:  11/14/23 138/82  10/18/23 117/79  08/08/23 119/85   Immunization History  Administered Date(s) Administered   Hepatitis B 11/18/2008, 12/18/2008, 05/25/2009   Td 06/20/2000   Tdap 02/03/2016   Health Maintenance Due  Topic Date Due   Zoster Vaccines- Shingrix (1 of 2) Never done   Colonoscopy  Never done      Past Medical History:  Diagnosis Date   ANXIETY 01/13/2007   Arthritis    OSTEO, MIDFOOT   Central serous retinopathy, bilateral    Chronic LBP 04/19/2011   Headache    History of kidney stones    HYPERLIPIDEMIA 01/13/2007   Hypertension, essential    NEPHROLITHIASIS, HX OF 04/26/2007   Obstructive sleep apnea on CPAP    Pneumonia    Primary osteoarthritis of right knee    Rhinitis, allergic    Testosterone  deficiency    a.) on TRT using Xyosted    Vitamin D  deficiency     Past Surgical History:  Procedure Laterality Date   BONE EXOSTOSIS EXCISION Right 11/27/2014   Procedure: PARTIAL EXCISION OF TALUS;  Surgeon: Sharlyn Deaner, DPM;  Location: [facility];  Service: Podiatry;  Laterality: Right;   PARTIAL KNEE ARTHROPLASTY Right 04/13/2023   Procedure: UNICOMPARTMENTAL KNEE;  Surgeon: Elner Hahn, MD;  Location: [facility];  Service: Orthopedics;  Laterality: Right;   PARTIAL KNEE ARTHROPLASTY Left 08/08/2023   Procedure: UNICOMPARTMENTAL KNEE;  Surgeon: Elner Hahn, MD;  Location: [facility];  Service: Orthopedics;  Laterality: Left;    reports that he quit smoking about 21 years ago. His smoking use included cigarettes. He started smoking about 36 years ago. He has a 15 pack-year smoking history. He has never used smokeless tobacco. He reports current alcohol use of about 2.0 standard drinks of alcohol per week. He reports that he does not use drugs. family history includes Diabetes in his mother; Migraines in his father; Prostate cancer in his father. Allergies  Allergen Reactions   Effexor  [Venlafaxine ] Other (See Comments)    Blurry vision, fatgue   Lexapro  [Escitalopram  Oxalate]     BAD REACTION-SUICIDAL   Current Outpatient Medications on  File Prior to Visit  Medication Sig Dispense Refill   acetaminophen  (TYLENOL ) 500 MG tablet Take 1,000 mg by mouth every 6 (six) hours as needed for mild pain (pain score 1-3) or moderate pain (pain score 4-6).     albuterol (VENTOLIN HFA) 108 (90 Base) MCG/ACT inhaler SMARTSIG:2 Puff(s) By Mouth Every 6 Hours PRN     ALPRAZolam  (XANAX ) 0.5 MG tablet TAKE 1 TABLET(0.5 MG) BY MOUTH THREE TIMES DAILY AS NEEDED FOR ANXIETY 90 tablet 2   amLODipine  (NORVASC ) 10 MG tablet Take 1 tablet (10 mg total) by mouth every morning. TAKE 1 TABLET(10 MG) BY MOUTH DAILY     apixaban  (ELIQUIS ) 2.5 MG TABS tablet Take 2.5 mg by mouth.     atorvastatin  (LIPITOR) 20 MG tablet Take 1 tablet (20 mg total) by mouth every morning.  TAKE 1 TABLET(20 MG) BY MOUTH DAILY     celecoxib (CELEBREX) 200 MG capsule Take 1 tablet by mouth daily.     fluticasone  (FLONASE ) 50 MCG/ACT nasal spray SHAKE LIQUID AND USE 2 SPRAYS IN EACH NOSTRIL EVERY DAY 16 g 0   hydrochlorothiazide  (MICROZIDE ) 12.5 MG capsule Take 1 capsule (12.5 mg total) by mouth daily. TAKE 1 CAPSULE(12.5 MG) BY MOUTH DAILY Must keep scheduled appt for future refills     loratadine  (CLARITIN ) 10 MG tablet Take 10 mg by mouth daily as needed for allergies.     naphazoline-pheniramine (ALLERGY EYE) 0.025-0.3 % ophthalmic solution Place 1 drop into both eyes 4 (four) times daily as needed for allergies.     Vitamin D -Vitamin K (VITAMIN K2-VITAMIN D3 PO) Take 1 tablet by mouth daily.     XYOSTED  100 MG/0.5ML SOAJ INJECT ONE pen SUBCUTANEOUS ONCE EVERY WEEK 2 mL 3   No current facility-administered medications on file prior to visit.        ROS:  All others reviewed and negative.  Objective        PE:  BP 138/82 (BP Location: Right Arm, Patient Position: Sitting, Cuff Size: Normal)   Pulse 70   Temp 98.4 F (36.9 C) (Oral)   Ht 5\' 8"  (1.727 m)   Wt 257 lb (116.6 kg)   SpO2 98%   BMI 39.08 kg/m                 Constitutional: Pt appears in NAD               HENT: Head: NCAT.                Right Ear: External ear normal.                 Left Ear: External ear normal.                Eyes: . Pupils are equal, round, and reactive to light. Conjunctivae and EOM are normal               Nose: without d/c or deformity               Neck: Neck supple. Gross normal ROM               Cardiovascular: Normal rate and regular rhythm.                 Pulmonary/Chest: Effort normal and breath sounds without rales or wheezing.                Abd:  Soft, NT, ND, + BS, no organomegaly  Neurological: Pt is alert. At baseline orientation, motor grossly intact               Skin: Skin is warm. No rashes, no other new lesions, LE edema - none                Psychiatric: Pt behavior is normal without agitation   Micro: none  Cardiac tracings I have personally interpreted today:  none  Pertinent Radiological findings (summarize): none   Lab Results  Component Value Date   WBC 7.5 04/07/2023   HGB 17.3 (H) 08/08/2023   HCT 52.3 (H) 10/13/2023   PLT 228 04/07/2023   GLUCOSE 92 08/08/2023   CHOL 160 11/11/2022   TRIG 82.0 11/11/2022   HDL 56.30 11/11/2022   LDLDIRECT 137.0 10/31/2019   LDLCALC 88 11/11/2022   ALT 36 07/31/2023   AST 25 07/31/2023   NA 139 08/08/2023   K 3.7 08/08/2023   CL 106 08/08/2023   CREATININE 1.00 08/08/2023   BUN 17 08/08/2023   CO2 24 07/31/2023   TSH 1.56 11/11/2022   PSA 1.08 11/11/2022   HGBA1C 5.4 11/11/2022   MICROALBUR 0.9 11/11/2022   Assessment/Plan:  VASIL JUHASZ is a 53 y.o. White or Caucasian [1] male with  has a past medical history of ANXIETY (01/13/2007), Arthritis, Central serous retinopathy, bilateral, Chronic LBP (04/19/2011), Headache, History of kidney stones, HYPERLIPIDEMIA (01/13/2007), Hypertension, essential, NEPHROLITHIASIS, HX OF (04/26/2007), Obstructive sleep apnea on CPAP, Pneumonia, Primary osteoarthritis of right knee, Rhinitis, allergic, Testosterone  deficiency, and Vitamin D  deficiency.  Vitamin D  deficiency Last vitamin D  Lab Results  Component Value Date   VD25OH 23.30 (L) 11/11/2022   Low, to start oral replacement   Encounter for general adult medical examination without abnormal findings Age and sex appropriate education and counseling updated with regular exercise and diet Referrals for preventative services - for cologuard Immunizations addressed - for shingrix at pharmacy Smoking counseling  - none needed Evidence for depression or other mood disorder - none significant Most recent labs reviewed. I have personally reviewed and have noted: 1) the patient's medical and social history 2) The patient's current medications and supplements 3) The patient's  height, weight, and BMI have been recorded in the chart   Essential hypertension BP Readings from Last 3 Encounters:  11/14/23 138/82  10/18/23 117/79  08/08/23 119/85   Borderline uncontrolled, , pt to continue medical treatment norvasc  10 every day, hct 12.5 every day as declines change for now, to f/u bp at home   Hyperglycemia Lab Results  Component Value Date   HGBA1C 5.4 11/11/2022   Stable, pt to continue current medical treatment  - diet, wt control   Hyperlipidemia Lab Results  Component Value Date   LDLCALC 88 11/11/2022   Stable, pt to continue current statin lipitor 20 mg qd   OSA (obstructive sleep apnea) Pt encouraged for yearly f/u, cont to work on better wt loss  Followup: Return in about 1 year (around 11/13/2024).  Rosalia Colonel, MD 11/14/2023 9:48 AM [facility]

## 2023-11-14 NOTE — Assessment & Plan Note (Signed)
Lab Results  Component Value Date   HGBA1C 5.4 11/11/2022   Stable, pt to continue current medical treatment  - diet, wt control

## 2023-11-14 NOTE — Progress Notes (Signed)
 The test results show that your current treatment is OK, as the tests are stable.  Please continue the same plan.  There is no other need for change of treatment or further evaluation based on these results, at this time.  thanks

## 2023-11-14 NOTE — Assessment & Plan Note (Signed)
 BP Readings from Last 3 Encounters:  11/14/23 138/82  10/18/23 117/79  08/08/23 119/85   Borderline uncontrolled, , pt to continue medical treatment norvasc  10 every day, hct 12.5 every day as declines change for now, to f/u bp at home

## 2023-11-14 NOTE — Patient Instructions (Addendum)
 Please have your Shingrix (shingles) shots done at your local pharmacy.  Please continue all other medications as before, and refills have been done if requested.  Please have the pharmacy call with any other refills you may need.  Please continue your efforts at being more active, low cholesterol diet, and weight control.  You are otherwise up to date with prevention measures today.  Please keep your appointments with your specialists as you may have planned  You will be contacted regarding the referral for: cologuard  Please go to the LAB at the blood drawing area for the tests to be done  You will be contacted by phone if any changes need to be made immediately.  Otherwise, you will receive a letter about your results with an explanation, but please check with MyChart first.  Please make an Appointment to return for your 1 year visit, or sooner if needed

## 2023-11-14 NOTE — Assessment & Plan Note (Signed)
Age and sex appropriate education and counseling updated with regular exercise and diet Referrals for preventative services - for cologuard Immunizations addressed - for shingrix at pharmacy Smoking counseling  - none needed Evidence for depression or other mood disorder - none significant Most recent labs reviewed. I have personally reviewed and have noted: 1) the patient's medical and social history 2) The patient's current medications and supplements 3) The patient's height, weight, and BMI have been recorded in the chart

## 2023-11-14 NOTE — Assessment & Plan Note (Signed)
 Pt encouraged for yearly f/u, cont to work on better wt loss

## 2023-11-14 NOTE — Assessment & Plan Note (Signed)
 Last vitamin D  Lab Results  Component Value Date   VD25OH 23.30 (L) 11/11/2022   Low, to start oral replacement

## 2023-11-16 ENCOUNTER — Other Ambulatory Visit: Payer: Self-pay | Admitting: Urology

## 2023-11-16 DIAGNOSIS — E291 Testicular hypofunction: Secondary | ICD-10-CM

## 2023-11-24 ENCOUNTER — Other Ambulatory Visit: Payer: Self-pay

## 2023-11-24 ENCOUNTER — Other Ambulatory Visit: Payer: Self-pay | Admitting: Internal Medicine

## 2023-12-06 ENCOUNTER — Encounter: Payer: Self-pay | Admitting: Urology

## 2023-12-06 ENCOUNTER — Encounter: Payer: Self-pay | Admitting: Internal Medicine

## 2023-12-06 MED ORDER — HYDROCHLOROTHIAZIDE 12.5 MG PO CAPS: 12.5000 mg | ORAL_CAPSULE | Freq: Every day | ORAL | 3 refills | Status: DC

## 2023-12-07 ENCOUNTER — Other Ambulatory Visit: Payer: Self-pay | Admitting: Urology

## 2023-12-07 MED ORDER — TESTOSTERONE 20.25 MG/ACT (1.62%) TD GEL: TRANSDERMAL | 2 refills | Status: DC

## 2023-12-15 ENCOUNTER — Other Ambulatory Visit: Payer: Self-pay | Admitting: Urology

## 2023-12-15 MED ORDER — TESTOSTERONE 20.25 MG/ACT (1.62%) TD GEL: TRANSDERMAL | 0 refills | Status: DC

## 2023-12-25 ENCOUNTER — Encounter: Payer: Self-pay | Admitting: Internal Medicine

## 2023-12-25 DIAGNOSIS — M7989 Other specified soft tissue disorders: Secondary | ICD-10-CM | POA: Diagnosis not present

## 2023-12-25 DIAGNOSIS — Z96652 Presence of left artificial knee joint: Secondary | ICD-10-CM | POA: Diagnosis not present

## 2023-12-25 DIAGNOSIS — Z96651 Presence of right artificial knee joint: Secondary | ICD-10-CM | POA: Diagnosis not present

## 2023-12-25 MED ORDER — HYDROCHLOROTHIAZIDE 25 MG PO TABS: 25.0000 mg | ORAL_TABLET | Freq: Every day | ORAL | 3 refills | Status: AC

## 2023-12-27 ENCOUNTER — Other Ambulatory Visit: Payer: Self-pay

## 2023-12-27 MED ORDER — AMLODIPINE BESYLATE 10 MG PO TABS: 10.0000 mg | ORAL_TABLET | ORAL | 1 refills | Status: DC

## 2023-12-27 MED ORDER — ATORVASTATIN CALCIUM 20 MG PO TABS: 20.0000 mg | ORAL_TABLET | ORAL | 1 refills | Status: DC

## 2024-01-19 ENCOUNTER — Ambulatory Visit (INDEPENDENT_AMBULATORY_CARE_PROVIDER_SITE_OTHER): Admitting: Urology

## 2024-01-19 VITALS — BP 124/76 | HR 86

## 2024-01-19 DIAGNOSIS — E291 Testicular hypofunction: Secondary | ICD-10-CM | POA: Diagnosis not present

## 2024-01-19 NOTE — Progress Notes (Signed)
 Started on topical testosterone  after Xyosted  no longer covered by his insurance.  He was inadvertently scheduled for a 1 month office visit when it should have been a lab visit for a testosterone  level.  Testosterone  level was drawn and he will be notified with results.  His prior follow-ups are scheduled

## 2024-01-20 LAB — TESTOSTERONE: Testosterone: 278 ng/dL (ref 264–916)

## 2024-01-21 ENCOUNTER — Ambulatory Visit: Payer: Self-pay | Admitting: Urology

## 2024-01-24 ENCOUNTER — Other Ambulatory Visit: Payer: Self-pay | Admitting: Internal Medicine

## 2024-01-25 ENCOUNTER — Encounter (INDEPENDENT_AMBULATORY_CARE_PROVIDER_SITE_OTHER): Payer: Self-pay | Admitting: Vascular Surgery

## 2024-01-25 ENCOUNTER — Ambulatory Visit (INDEPENDENT_AMBULATORY_CARE_PROVIDER_SITE_OTHER): Admitting: Vascular Surgery

## 2024-01-25 VITALS — BP 136/77 | HR 84 | Resp 18 | Ht 68.0 in | Wt 265.0 lb

## 2024-01-25 DIAGNOSIS — I10 Essential (primary) hypertension: Secondary | ICD-10-CM

## 2024-01-25 DIAGNOSIS — M7989 Other specified soft tissue disorders: Secondary | ICD-10-CM

## 2024-01-25 DIAGNOSIS — E785 Hyperlipidemia, unspecified: Secondary | ICD-10-CM | POA: Diagnosis not present

## 2024-01-29 ENCOUNTER — Encounter: Payer: Self-pay | Admitting: Internal Medicine

## 2024-01-29 DIAGNOSIS — E6609 Other obesity due to excess calories: Secondary | ICD-10-CM

## 2024-01-31 MED ORDER — AMLODIPINE BESYLATE 10 MG PO TABS: 5.0000 mg | ORAL_TABLET | ORAL | Status: DC

## 2024-02-01 ENCOUNTER — Encounter (INDEPENDENT_AMBULATORY_CARE_PROVIDER_SITE_OTHER): Payer: Self-pay | Admitting: Vascular Surgery

## 2024-02-01 NOTE — Progress Notes (Signed)
 Subjective:    Patient ID: Gabriel Moon, male    DOB: [birth year], 53 y.o.   MRN: [id number] Chief Complaint  Patient presents with   New Patient (Initial Visit)    Ref mundy consult ble swelling    Gabriel Moon is a 53 yo male who presents to clinic today with chief complaint of bilateral lower extremity leg swelling.  Patient endorses back in February he had a partial knee replacement of his left lower extremity.  He notes that the swelling had started shortly after that.  He endorses when he first started it was intermittent.  He would go to bed at night and the swelling would be reduced by morning.  Today he states that the swelling is more consistent and worsens throughout the day.  He does not complain of pain but does complain of throbbing aching and sometimes numbness and tingling to his feet.  He has not had any recent ultrasounds of his lower extremities for DVTs or reflux.  He was on Eliquis  for a period after his partial knee replacement.  He endorses he has not tried any conventional therapy on a daily basis at this time.    Review of Systems  Constitutional: Negative.   Cardiovascular:  Positive for leg swelling.  Musculoskeletal:  Positive for arthralgias.  All other systems reviewed and are negative.      Objective:   Physical Exam Vitals reviewed.  Constitutional:      Appearance: Normal appearance. He is obese.  HENT:     Head: Normocephalic.  Eyes:     Pupils: Pupils are equal, round, and reactive to light.  Cardiovascular:     Rate and Rhythm: Normal rate and regular rhythm.     Pulses: Normal pulses.     Heart sounds: Normal heart sounds.  Pulmonary:     Effort: Pulmonary effort is normal.     Breath sounds: Normal breath sounds.  Abdominal:     General: Bowel sounds are normal.     Palpations: Abdomen is soft.  Musculoskeletal:     Right lower leg: Edema present.     Left lower leg: Edema present.  Skin:    General: Skin is warm and dry.      Capillary Refill: Capillary refill takes 2 to 3 seconds.  Neurological:     General: No focal deficit present.     Mental Status: He is alert and oriented to person, place, and time. Mental status is at baseline.  Psychiatric:        Mood and Affect: Mood normal.        Behavior: Behavior normal.        Thought Content: Thought content normal.        Judgment: Judgment normal.     BP 136/77   Pulse 84   Resp 18   Ht 5' 8 (1.727 m)   Wt 265 lb (120.2 kg)   BMI 40.29 kg/m   Past Medical History:  Diagnosis Date   ANXIETY 01/13/2007   Arthritis    OSTEO, MIDFOOT   Central serous retinopathy, bilateral    Chronic LBP 04/19/2011   Headache    History of kidney stones    HYPERLIPIDEMIA 01/13/2007   Hypertension, essential    NEPHROLITHIASIS, HX OF 04/26/2007   Obstructive sleep apnea on CPAP    Pneumonia    Primary osteoarthritis of right knee    Rhinitis, allergic    Testosterone  deficiency    a.) on TRT using  Xyosted    Vitamin D  deficiency     Social History   Socioeconomic History   Marital status: Significant Other    Spouse name: Not on file   Number of children: 2   Years of education: Not on file   Highest education level: Not on file  Occupational History   Occupation: Radiographer, therapeutic service  Tobacco Use   Smoking status: Former    Current packs/day: 0.00    Average packs/day: 1 pack/day for 15.0 years (15.0 ttl pk-yrs)    Types: Cigarettes    Start date: 06/21/1987    Quit date: 06/20/2002    Years since quitting: 21.6   Smokeless tobacco: Never  Vaping Use   Vaping status: Some Days   Substances: Nicotine, Flavoring  Substance and Sexual Activity   Alcohol use: Yes    Alcohol/week: 2.0 standard drinks of alcohol    Types: 2 Cans of beer per week    Comment: weekends   Drug use: No   Sexual activity: Yes    Birth control/protection: None  Other Topics Concern   Not on file  Social History Narrative   [place];  light smoker [1 for 10 days]; alcohol- once couple of weeks; lives in [place].     Social Drivers of Health   Financial Resource Strain: Medium Risk (12/25/2023)   Received from [facility]   Overall Financial Resource Strain (CARDIA)    Difficulty of Paying Living Expenses: Somewhat hard  Food Insecurity: No Food Insecurity (12/25/2023)   Received from [facility]   Hunger Vital Sign    Within the past 12 months, you worried that your food would run out before you got the money to buy more.: Never true    Within the past 12 months, the food you bought just didn't last and you didn't have money to get more.: Never true  Transportation Needs: No Transportation Needs (12/25/2023)   Received from [facility] - Transportation    In the past 12 months, has lack of transportation kept you from medical appointments or from getting medications?: No    Lack of Transportation (Non-Medical): No  Physical Activity: Not on file  Stress: Not on file  Social Connections: Not on file  Intimate Partner Violence: Not on file    Past Surgical History:  Procedure Laterality Date   BONE EXOSTOSIS EXCISION Right 11/27/2014   Procedure: PARTIAL EXCISION OF TALUS;  Surgeon: Donnice Cory, DPM;  Location: [facility];  Service: Podiatry;  Laterality: Right;   PARTIAL KNEE ARTHROPLASTY Right 04/13/2023   Procedure: UNICOMPARTMENTAL KNEE;  Surgeon: Edie Norleen PARAS, MD;  Location: [facility];  Service: Orthopedics;  Laterality: Right;   PARTIAL KNEE ARTHROPLASTY Left 08/08/2023   Procedure: UNICOMPARTMENTAL KNEE;  Surgeon: Edie Norleen PARAS, MD;  Location: [facility];  Service: Orthopedics;  Laterality: Left;    Family History  Problem Relation Age of Onset   Diabetes Mother    Prostate cancer Father    Migraines Father     Allergies  Allergen Reactions   Effexor  [Venlafaxine ] Other (See Comments)    Blurry vision, fatgue   Lexapro   [Escitalopram  Oxalate]     BAD REACTION-SUICIDAL       Latest Ref Rng & Units 11/14/2023    8:45 AM 10/13/2023    8:15 AM 08/08/2023   12:14 PM  CBC  WBC 4.0 - 10.5 K/uL 8.1     Hemoglobin  13.0 - 17.0 g/dL 83.5   82.6   Hematocrit 39.0 - 52.0 % 48.0  52.3  51.0   Platelets 150.0 - 400.0 K/uL 281.0         CMP     Component Value Date/Time   NA 139 11/14/2023 0845   NA 141 04/07/2022 0822   K 3.6 11/14/2023 0845   CL 104 11/14/2023 0845   CO2 25 11/14/2023 0845   GLUCOSE 95 11/14/2023 0845   BUN 19 11/14/2023 0845   BUN 11 09/21/2018 0930   CREATININE 0.95 11/14/2023 0845   CREATININE 1.02 01/31/2020 0942   CALCIUM  9.1 11/14/2023 0845   PROT 7.1 11/14/2023 0845   PROT 6.9 09/21/2018 0930   ALBUMIN 4.3 11/14/2023 0845   ALBUMIN 4.3 09/21/2018 0930   AST 15 11/14/2023 0845   ALT 31 11/14/2023 0845   ALKPHOS 65 11/14/2023 0845   BILITOT 0.5 11/14/2023 0845   BILITOT 0.8 09/21/2018 0930   GFR 92.40 11/14/2023 0845   EGFR 101 04/07/2022 0822   GFRNONAA >60 07/31/2023 1359   GFRNONAA 87 01/31/2020 0942     No results found.     Assessment & Plan:   1. Leg swelling (Primary) Recommend:  I have had a long discussion with the patient regarding swelling and why it  causes symptoms.  Patient will begin wearing graduated compression on a daily basis a prescription was given. The patient will  wear the stockings first thing in the morning and removing them in the evening. The patient is instructed specifically not to sleep in the stockings.   In addition, behavioral modification will be initiated.  This will include frequent elevation, use of over the counter pain medications and exercise such as walking.  Consideration for a lymph pump will also be made based upon the effectiveness of conservative therapy.  This would help to improve the edema control and prevent sequela such as ulcers and infections   The patient will follow-up with me after using conventional therapy  for 3 months.   2. Essential hypertension Continue antihypertensive medications as already ordered, these medications have been reviewed and there are no changes at this time.  I recommend that the patient follow-up with his PCP related to his blood pressure medications at this time.  These may be contributing to some of his lower extremity swelling.  If these medications can be adjusted or changed that may help.  3. Dyslipidemia (high LDL; low HDL) Continue statin as ordered and reviewed, no changes at this time  4. Obesity I encouraged the patient to lose weight today.  We had a discussion today concerning his weight and how it contributes to lower extremity swelling.  Weight loss at this time would take pressure off of his legs while exercising therefore helping reduce the swelling.  I encouraged him to follow-up with his PCP regarding the weight loss drugs or market and whether or not they would be appropriate for him at this time.   Current Outpatient Medications on File Prior to Visit  Medication Sig Dispense Refill   acetaminophen  (TYLENOL ) 500 MG tablet Take 1,000 mg by mouth every 6 (six) hours as needed for mild pain (pain score 1-3) or moderate pain (pain score 4-6).     albuterol  (VENTOLIN  HFA) 108 (90 Base) MCG/ACT inhaler SMARTSIG:2 Puff(s) By Mouth Every 6 Hours PRN     ALPRAZolam  (XANAX ) 0.5 MG tablet TAKE 1 TABLET(0.5 MG) BY MOUTH THREE TIMES DAILY AS NEEDED FOR ANXIETY 90 tablet 2  atorvastatin  (LIPITOR) 20 MG tablet Take 1 tablet (20 mg total) by mouth every morning. TAKE 1 TABLET(20 MG) BY MOUTH DAILY 90 tablet 1   fluticasone  (FLONASE ) 50 MCG/ACT nasal spray SHAKE LIQUID AND USE 2 SPRAYS IN EACH NOSTRIL EVERY DAY 16 g 0   hydrochlorothiazide  (HYDRODIURIL ) 25 MG tablet Take 1 tablet (25 mg total) by mouth daily. 90 tablet 3   loratadine  (CLARITIN ) 10 MG tablet Take 10 mg by mouth daily as needed for allergies.     naphazoline-pheniramine (ALLERGY EYE) 0.025-0.3 % ophthalmic  solution Place 1 drop into both eyes 4 (four) times daily as needed for allergies.     telmisartan  (MICARDIS ) 80 MG tablet Take 80 mg by mouth daily.     Testosterone  20.25 MG/ACT (1.62%) GEL 1 pump applied to each shoulder daily 75 g 0   Vitamin D -Vitamin K (VITAMIN K2-VITAMIN D3 PO) Take 1 tablet by mouth daily.     apixaban  (ELIQUIS ) 2.5 MG TABS tablet Take 2.5 mg by mouth. (Patient not taking: Reported on 01/25/2024)     celecoxib (CELEBREX) 200 MG capsule Take 1 tablet by mouth daily. (Patient not taking: Reported on 01/25/2024)     No current facility-administered medications on file prior to visit.    There are no Patient Instructions on file for this visit. No follow-ups on file.   Gwendlyn JONELLE Shank, NP

## 2024-02-02 ENCOUNTER — Telehealth: Payer: Self-pay

## 2024-02-02 DIAGNOSIS — E6609 Other obesity due to excess calories: Secondary | ICD-10-CM

## 2024-02-02 NOTE — Telephone Encounter (Signed)
 Copied from CRM #[id number]. Topic: Referral - Question >> Feb 02, 2024  9:36 AM Burnard DEL wrote: Reason for CRM: [facility] clinic called in stating that a  referral  was sent to them to Dr Tye for Bariatric surgery ,however they do not do bariatric surgery he/him/his only do general surgery.

## 2024-02-02 NOTE — Telephone Encounter (Signed)
Can you change the referral?

## 2024-02-02 NOTE — Telephone Encounter (Signed)
 Ok I have reordered this

## 2024-02-07 NOTE — Telephone Encounter (Signed)
 Patient has been made aware and I did give him the number to schedule his appointment.

## 2024-02-27 NOTE — Telephone Encounter (Unsigned)
 Copied from CRM [id number]. Topic: Referral - Status >> Feb 27, 2024 12:46 PM Thersia BROCKS wrote: Reason for CRM: Debbie nurse at [facility] called regarding referral for patient , stated she is returning a missed call from [place] , Stated they did not received referral and to please refax it to   [phone] Attn: Grayce Louder

## 2024-02-28 NOTE — Telephone Encounter (Signed)
 This has since been re-faxed with confirmation

## 2024-03-29 ENCOUNTER — Other Ambulatory Visit: Payer: Self-pay

## 2024-03-29 ENCOUNTER — Other Ambulatory Visit: Payer: Self-pay | Admitting: Internal Medicine

## 2024-03-29 MED ORDER — FLUTICASONE PROPIONATE 50 MCG/ACT NA SUSP: 2.0000 | Freq: Every day | NASAL | 0 refills | Status: AC

## 2024-03-29 MED ORDER — TELMISARTAN 80 MG PO TABS: 80.0000 mg | ORAL_TABLET | Freq: Every day | ORAL | 0 refills | Status: DC

## 2024-03-29 NOTE — Telephone Encounter (Unsigned)
 Copied from CRM [id number]. Topic: Clinical - Medication Refill >> Mar 29, 2024  8:10 AM Willma R wrote: Medication: fluticasone  (FLONASE ) 50 MCG/ACT nasal spray telmisartan  (MICARDIS ) 80 MG tablet  Has the patient contacted their pharmacy? Pharmacy calling  This is the patient's preferred pharmacy:  [id number] - [place], [place] - [address] AT [facility] [address] [place] [address] [place] [address] Phone: [phone] Fax: [phone]  Is this the correct pharmacy for this prescription? Yes If no, delete pharmacy and type the correct one.   Has the prescription been filled recently? No  Is the patient out of the medication? Yes  Has the patient been seen for an appointment in the last year OR does the patient have an upcoming appointment? Yes  Can we respond through MyChart? Yes  Agent: Please be advised that Rx refills may take up to 3 business days. We ask that you follow-up with your pharmacy.

## 2024-04-18 ENCOUNTER — Other Ambulatory Visit

## 2024-04-18 DIAGNOSIS — E291 Testicular hypofunction: Secondary | ICD-10-CM

## 2024-04-19 LAB — HEMOGLOBIN AND HEMATOCRIT, BLOOD
Hematocrit: 46.3 % (ref 37.5–51.0)
Hemoglobin: 15.5 g/dL (ref 13.0–17.7)

## 2024-04-19 LAB — TESTOSTERONE: Testosterone: 292 ng/dL (ref 264–916)

## 2024-04-23 ENCOUNTER — Encounter: Payer: Self-pay | Admitting: Urology

## 2024-04-26 ENCOUNTER — Ambulatory Visit (INDEPENDENT_AMBULATORY_CARE_PROVIDER_SITE_OTHER): Admitting: Vascular Surgery

## 2024-04-29 ENCOUNTER — Other Ambulatory Visit: Payer: Self-pay | Admitting: Urology

## 2024-04-29 DIAGNOSIS — E291 Testicular hypofunction: Secondary | ICD-10-CM

## 2024-04-29 MED ORDER — TLANDO 112.5 MG PO CAPS: ORAL_CAPSULE | ORAL | 0 refills | Status: DC

## 2024-06-03 ENCOUNTER — Other Ambulatory Visit: Payer: Self-pay | Admitting: *Deleted

## 2024-06-03 MED ORDER — TLANDO 112.5 MG PO CAPS: ORAL_CAPSULE | ORAL | 0 refills | Status: AC

## 2024-06-24 NOTE — Telephone Encounter (Signed)
 Looks like the medication was never faxed . I done a verbal to refill the medication with 2 refill.

## 2024-06-27 ENCOUNTER — Encounter: Payer: Self-pay | Admitting: Internal Medicine

## 2024-06-27 MED ORDER — TELMISARTAN 80 MG PO TABS: 80.0000 mg | ORAL_TABLET | Freq: Every day | ORAL | 3 refills | Status: AC

## 2024-06-27 MED ORDER — ATORVASTATIN CALCIUM 20 MG PO TABS: 20.0000 mg | ORAL_TABLET | ORAL | 3 refills | Status: AC

## 2024-06-27 MED ORDER — ALPRAZOLAM 0.5 MG PO TABS: 0.5000 mg | ORAL_TABLET | Freq: Three times a day (TID) | ORAL | 2 refills | Status: AC | PRN

## 2024-06-27 MED ORDER — AMLODIPINE BESYLATE 10 MG PO TABS: 5.0000 mg | ORAL_TABLET | ORAL | 3 refills | Status: DC

## 2024-07-09 ENCOUNTER — Encounter: Payer: Self-pay | Admitting: Internal Medicine

## 2024-07-09 MED ORDER — AMLODIPINE BESYLATE 10 MG PO TABS: 5.0000 mg | ORAL_TABLET | ORAL | 3 refills | Status: DC

## 2024-07-17 ENCOUNTER — Telehealth: Payer: Self-pay

## 2024-07-17 MED ORDER — AMLODIPINE BESYLATE 10 MG PO TABS: ORAL_TABLET | ORAL | 3 refills | Status: AC

## 2024-07-17 NOTE — Telephone Encounter (Signed)
 Ok this was corrected and done erx   thanks

## 2024-07-17 NOTE — Addendum Note (Signed)
 Addended by: NORLEEN LYNWOOD ORN on: 07/17/2024 01:08 PM   Modules accepted: Orders

## 2024-07-17 NOTE — Telephone Encounter (Signed)
 Copied from CRM #[id number]. Topic: Clinical - Medication Question >> Jul 17, 2024  8:43 AM Robinson H wrote: Reason for CRM: Lamar calling from [facility] pharmacy to clarify directions for patients amLODipine  (NORVASC ) 10 MG tablet, directions states  0.5 tablets (5 mg total) by mouth every morning. TAKE 1 TABLET(10 MG) BY MOUTH DAILY  Lamar Junk [phone]

## 2024-10-17 ENCOUNTER — Other Ambulatory Visit

## 2024-10-18 ENCOUNTER — Ambulatory Visit: Admitting: Urology

## 2024-11-14 ENCOUNTER — Encounter: Admitting: Internal Medicine
